# Patient Record
Sex: Female | Born: 1965
Health system: Southern US, Community
[De-identification: ages and names within clinical notes are randomized; demographics above are authoritative.]

## PROBLEM LIST (undated history)

## (undated) DIAGNOSIS — E669 Obesity, unspecified: Secondary | ICD-10-CM

## (undated) DIAGNOSIS — G40909 Epilepsy, unspecified, not intractable, without status epilepticus: Secondary | ICD-10-CM

## (undated) DIAGNOSIS — J45909 Unspecified asthma, uncomplicated: Secondary | ICD-10-CM

## (undated) DIAGNOSIS — T7840XA Allergy, unspecified, initial encounter: Secondary | ICD-10-CM

## (undated) DIAGNOSIS — F79 Unspecified intellectual disabilities: Secondary | ICD-10-CM

## (undated) DIAGNOSIS — F445 Conversion disorder with seizures or convulsions: Secondary | ICD-10-CM

## (undated) DIAGNOSIS — R569 Unspecified convulsions: Secondary | ICD-10-CM

## (undated) DIAGNOSIS — E785 Hyperlipidemia, unspecified: Secondary | ICD-10-CM

## (undated) DIAGNOSIS — I10 Essential (primary) hypertension: Secondary | ICD-10-CM

## (undated) HISTORY — PX: REDUCTION MAMMAPLASTY: SUR839

## (undated) HISTORY — DX: Unspecified asthma, uncomplicated: J45.909

## (undated) HISTORY — DX: Essential (primary) hypertension: I10

## (undated) HISTORY — DX: Obesity, unspecified: E66.9

## (undated) HISTORY — PX: BREAST CYST EXCISION: SHX579

## (undated) HISTORY — DX: Hyperlipidemia, unspecified: E78.5

## (undated) HISTORY — DX: Allergy, unspecified, initial encounter: T78.40XA

---

## 2004-07-23 ENCOUNTER — Other Ambulatory Visit: Admission: RE | Admit: 2004-07-23 | Discharge: 2004-07-23 | Payer: Self-pay | Admitting: Internal Medicine

## 2004-07-30 ENCOUNTER — Encounter: Admission: RE | Admit: 2004-07-30 | Discharge: 2004-07-30 | Payer: Self-pay | Admitting: Internal Medicine

## 2004-10-09 ENCOUNTER — Emergency Department (HOSPITAL_COMMUNITY): Admission: EM | Admit: 2004-10-09 | Discharge: 2004-10-09 | Payer: Self-pay | Admitting: Family Medicine

## 2005-03-02 ENCOUNTER — Inpatient Hospital Stay (HOSPITAL_COMMUNITY): Admission: EM | Admit: 2005-03-02 | Discharge: 2005-03-04 | Payer: Self-pay | Admitting: Emergency Medicine

## 2005-05-21 ENCOUNTER — Encounter (INDEPENDENT_AMBULATORY_CARE_PROVIDER_SITE_OTHER): Payer: Self-pay | Admitting: Pathology

## 2005-05-21 ENCOUNTER — Ambulatory Visit (HOSPITAL_BASED_OUTPATIENT_CLINIC_OR_DEPARTMENT_OTHER): Admission: RE | Admit: 2005-05-21 | Discharge: 2005-05-21 | Payer: Self-pay | Admitting: General Surgery

## 2005-05-30 ENCOUNTER — Emergency Department (HOSPITAL_COMMUNITY): Admission: EM | Admit: 2005-05-30 | Discharge: 2005-05-30 | Payer: Self-pay | Admitting: Emergency Medicine

## 2005-10-28 ENCOUNTER — Emergency Department (HOSPITAL_COMMUNITY): Admission: EM | Admit: 2005-10-28 | Discharge: 2005-10-28 | Payer: Self-pay | Admitting: Emergency Medicine

## 2005-11-15 ENCOUNTER — Ambulatory Visit (HOSPITAL_COMMUNITY): Payer: Self-pay | Admitting: Psychiatry

## 2006-01-24 ENCOUNTER — Emergency Department (HOSPITAL_COMMUNITY): Admission: EM | Admit: 2006-01-24 | Discharge: 2006-01-24 | Payer: Self-pay | Admitting: Emergency Medicine

## 2006-02-13 ENCOUNTER — Emergency Department (HOSPITAL_COMMUNITY): Admission: EM | Admit: 2006-02-13 | Discharge: 2006-02-13 | Payer: Self-pay | Admitting: Emergency Medicine

## 2006-02-19 ENCOUNTER — Emergency Department (HOSPITAL_COMMUNITY): Admission: EM | Admit: 2006-02-19 | Discharge: 2006-02-19 | Payer: Self-pay | Admitting: Emergency Medicine

## 2006-03-09 ENCOUNTER — Encounter: Admission: RE | Admit: 2006-03-09 | Discharge: 2006-06-07 | Payer: Self-pay | Admitting: Internal Medicine

## 2006-06-14 ENCOUNTER — Inpatient Hospital Stay (HOSPITAL_COMMUNITY): Admission: AD | Admit: 2006-06-14 | Discharge: 2006-06-16 | Payer: Self-pay | Admitting: Pediatrics

## 2007-02-04 ENCOUNTER — Inpatient Hospital Stay (HOSPITAL_COMMUNITY): Admission: EM | Admit: 2007-02-04 | Discharge: 2007-02-06 | Payer: Self-pay | Admitting: Emergency Medicine

## 2007-02-10 ENCOUNTER — Other Ambulatory Visit: Admission: RE | Admit: 2007-02-10 | Discharge: 2007-02-10 | Payer: Self-pay | Admitting: Internal Medicine

## 2007-02-28 ENCOUNTER — Emergency Department (HOSPITAL_COMMUNITY): Admission: EM | Admit: 2007-02-28 | Discharge: 2007-02-28 | Payer: Self-pay | Admitting: Emergency Medicine

## 2007-05-15 ENCOUNTER — Emergency Department (HOSPITAL_COMMUNITY): Admission: EM | Admit: 2007-05-15 | Discharge: 2007-05-15 | Payer: Self-pay | Admitting: Emergency Medicine

## 2007-08-06 ENCOUNTER — Emergency Department (HOSPITAL_COMMUNITY): Admission: EM | Admit: 2007-08-06 | Discharge: 2007-08-06 | Payer: Self-pay | Admitting: Emergency Medicine

## 2007-11-10 ENCOUNTER — Emergency Department (HOSPITAL_BASED_OUTPATIENT_CLINIC_OR_DEPARTMENT_OTHER): Admission: EM | Admit: 2007-11-10 | Discharge: 2007-11-10 | Payer: Self-pay | Admitting: Emergency Medicine

## 2007-11-15 ENCOUNTER — Emergency Department (HOSPITAL_COMMUNITY): Admission: EM | Admit: 2007-11-15 | Discharge: 2007-11-15 | Payer: Self-pay | Admitting: Emergency Medicine

## 2008-05-29 ENCOUNTER — Other Ambulatory Visit: Admission: RE | Admit: 2008-05-29 | Discharge: 2008-05-29 | Payer: Self-pay | Admitting: Cardiology

## 2008-09-03 ENCOUNTER — Encounter: Admission: RE | Admit: 2008-09-03 | Discharge: 2008-09-03 | Payer: Self-pay | Admitting: Internal Medicine

## 2008-09-26 ENCOUNTER — Encounter: Admission: RE | Admit: 2008-09-26 | Discharge: 2008-09-26 | Payer: Self-pay | Admitting: Internal Medicine

## 2008-11-12 ENCOUNTER — Ambulatory Visit (HOSPITAL_COMMUNITY): Admission: RE | Admit: 2008-11-12 | Discharge: 2008-11-12 | Payer: Self-pay | Admitting: General Surgery

## 2008-11-12 ENCOUNTER — Encounter (INDEPENDENT_AMBULATORY_CARE_PROVIDER_SITE_OTHER): Payer: Self-pay | Admitting: General Surgery

## 2009-07-13 ENCOUNTER — Emergency Department (HOSPITAL_COMMUNITY): Admission: EM | Admit: 2009-07-13 | Discharge: 2009-07-13 | Payer: Self-pay | Admitting: Emergency Medicine

## 2010-02-16 ENCOUNTER — Encounter: Payer: Self-pay | Admitting: Internal Medicine

## 2010-04-12 LAB — POCT I-STAT, CHEM 8
Hemoglobin: 13.9 g/dL (ref 12.0–15.0)
Sodium: 136 mEq/L (ref 135–145)
TCO2: 27 mmol/L (ref 0–100)

## 2010-04-12 LAB — GLUCOSE, CAPILLARY: Glucose-Capillary: 273 mg/dL — ABNORMAL HIGH (ref 70–99)

## 2010-04-12 LAB — CBC
HCT: 37.4 % (ref 36.0–46.0)
MCV: 84.5 fL (ref 78.0–100.0)
Platelets: 158 10*3/uL (ref 150–400)
RDW: 14.1 % (ref 11.5–15.5)

## 2010-04-12 LAB — DIFFERENTIAL
Basophils Absolute: 0 10*3/uL (ref 0.0–0.1)
Eosinophils Absolute: 0 10*3/uL (ref 0.0–0.7)
Eosinophils Relative: 1 % (ref 0–5)

## 2010-04-30 LAB — DIFFERENTIAL
Basophils Absolute: 0 10*3/uL (ref 0.0–0.1)
Lymphocytes Relative: 38 % (ref 12–46)
Monocytes Absolute: 0.3 10*3/uL (ref 0.1–1.0)
Neutro Abs: 2.2 10*3/uL (ref 1.7–7.7)
Neutrophils Relative %: 53 % (ref 43–77)

## 2010-04-30 LAB — CBC
HCT: 40.7 % (ref 36.0–46.0)
MCV: 85.6 fL (ref 78.0–100.0)
Platelets: 193 10*3/uL (ref 150–400)
RDW: 14.4 % (ref 11.5–15.5)

## 2010-04-30 LAB — COMPREHENSIVE METABOLIC PANEL
Albumin: 4.1 g/dL (ref 3.5–5.2)
BUN: 6 mg/dL (ref 6–23)
Chloride: 102 mEq/L (ref 96–112)
Creatinine, Ser: 0.51 mg/dL (ref 0.4–1.2)
Total Bilirubin: 0.6 mg/dL (ref 0.3–1.2)
Total Protein: 7.4 g/dL (ref 6.0–8.3)

## 2010-06-09 NOTE — Procedures (Signed)
ELECTROENCEPHALOGRAM NUMBER:  [id number].   ORDERING PHYSICIANS:  Dr. Sandria Manly and Dr. Nash Shearer.   HISTORY OF PRESENT ILLNESS:  A 45 year old woman with intractable  seizures admitted with an episode of possible seizure activity with  rigidity and convulsions.   CURRENT MEDICATIONS:  Medications listed include Lamictal, Keppra,  Actos, Lantus, metformin and Norvasc.   ELECTROENCEPHALOGRAM PROCEDURE:  This is a routine 17-channel EEG with 1  channel devoted to EKG, utilizing International 10/20 lead placement  system.  The patient was described as being awake and asleep clinically.  Electrographically the patient appeared to be awake and drowsy and in  light natural sleep.  While awake the background consisted of a fairly  well-organized, well-developed, well-modulated 10 Hz alpha activity that  was predominantly in the posterior head regions and reactive to eye-  opening.  Attenuation in the background drowsiness and sleep, some sharp  activity seen over the vertex with drowsiness and sleep spindles, K  complexes and delta activity were seen during sleep.  No definite  epileptiform discharges were seen.  No clear interhemispheric asymmetry  is identified.  Hyperventilation did not produce significant change in  background activity.  Photic stimulation did not produce any change in  background activity however, the patient was still in the drowsy and  sleep states during this and was not fully roused.   The EKG monitor reveals relatively regular rhythm with a rate of 72  beats per minute.   CONCLUSION:  Normal electroencephalogram in the waking, drowsy and light  natural sleep states without seizure activity seen during course of  today's recording.  Clinical correlation is recommended.      Catherine A. Orlin Hilding, M.D.  Electronically Signed     EAV:[id number]  D:  06/16/2006 10:09:38  T:  06/16/2006 10:40:00  Job #:  [id number]

## 2010-06-09 NOTE — H&P (Signed)
NAMEKEISHAWNA, CARRANZA              ACCOUNT NO.:  [id number]   MEDICAL RECORD NO.:  [id number]          PATIENT TYPE:  OBV   LOCATION:  4731                         FACILITY:  [facility]   PHYSICIAN:  Melvyn Novas, M.D.  DATE OF BIRTH:  [birth year]   DATE OF ADMISSION:  02/04/2007  DATE OF DISCHARGE:                              HISTORY & PHYSICAL   DATE OF ADMISSION:  February 04, 2007, at noontime.   Mrs. Mondesir presented today in the morning to the [facility] ER after  having suffered a seizure and a prolonged postictum at home. The patient  has a known seizure disorder and was accompanied by her mother who also  stated that her daughter was unusually  drowsy for such a long time and  remained disoriented after the seizure.  During her waking period in the ER, the patient suffered a second  seizure like episode , observed by nurses and physicians. She did remain  so drowsy after the second seizure, which was self limited, that it was  not possible to discharge her home, and she was admitted for an  observational period.   HISTORY OF PRESENT ILLNESS:  The patient has a known seizure disorder  and is followed at [facility] by Dr. Nash Shearer.  She had four ER visits for  seizures in 2007, two in 2008.  Her seizures have always been  generalized since childhood, and she had been on Depakote and Keppra  until 18 months ago when she developed leukopenia and was also gaining  rapidly weight.  She was changed from Depakote to Lamictal and has been  doing well with this current medication regimen which I will in detail  list later.   PAST MEDICAL HISTORY.:  Besides the generalized seizure disorder with  tonic-clonic type seizures, she has also obesity, probably medication  induced. She has type 2 diabetes mellitus secondary to obesity.  She has  hypertension secondary to obesity.  She is snoring and hypersomnic but  has never been checked for obstructive sleep apnea, she states, and she  has cystic  breast disease was negative  mammogram fairly recently.  She  also at one time had a breast biopsy which was negative.   CURRENT MEDICATIONS:  1. Keppra 2000 mg b.i.d.  2. Lamictal 150 mg in the morning, 200 in the evening.  3. Lantus insulin 14 units daily.  4. Actos 15 mg daily.  5. Metformin 500 mg daily.  6. Norvasc 5 mg daily.  7. Aspirin 81 mg daily.   A head CT today was unremarkable.   A toxicology screen was ordered but not yet available.  The pH was 7.4,  so is fairly high; pCO2 was a low.  Hemoglobin 15.1, hematocrit  44.  Sodium was 37, potassium 4.4 glucose 149.   PHYSICAL EXAMINATION:  VITAL SIGNS:  Temperature was 97 degrees  Fahrenheit which is equal to 36 degrees Celsius.  Pulse rate was 62.  Respirations were 20 and unlabored. Blood pressure was 128/70. Heart  rate was regular.  GENERAL:  There was no murmur.  No bruit.  There were no peripheral  injuries seen. No tongue bite.  as I interview the patient, she cannot  state if she had any urinary or stool incontinence and states that when  she awoke, she had a hospital gown on. She does not know what happened  exactly.  HEENT:  She is atraumatic normocephalic, anicteric.  NEUROLOGIC:  She provides full extraocular movements, but her speech is  slurred as the patient appears extremely drowsy. She follows simple  motor commands, able to extend all four extremities antigravity. Deep  tendon reflexes are 1+.  She has upgoing toes to plantar stimulation  bilaterally. She states that she feels touch, pinprick and vibration in  both feet and both hands.  There is no evidence of extinction.  She fell  asleep during the finger-nose test, but focal weakness was not noticed.  Gait had to be deferred due to the patient's mental status and  drowsiness. I did not notice any tremor or ataxia.   ASSESSMENT:  Breakthrough seizures in a patient with existing  generalized seizure disorder since childhood.  Question of additional  psychiatric or developmental  conditions, as  patient communicates not on a level expected for age .   PLAN:  Check Lamictal and check Keppra levels.  Give one dose of Keppra extra IV.  I have ordered 1 gram extra, and once  the patient is able to take p.o. medication, resume the home regimen.  The patient is here for an observational period, and hold I hope that I  can discharge her tomorrow.  Should her mental status remain impaired, we might have to obtain an EEG  on Monday here before discharging the patient.      Melvyn Novas, M.D.  Electronically Signed     CD/MEDQ  D:  02/04/2007  T:  02/05/2007  Job:  [id number]   cc:   Estanislado Pandy, MD

## 2010-06-09 NOTE — H&P (Signed)
Tammy Arellano, Tammy Arellano              ACCOUNT NO.:  [id number]   MEDICAL RECORD NO.:  [id number]          PATIENT TYPE:  EMS   LOCATION:  ED                           FACILITY:  [facility]   PHYSICIAN:  Deanna Artis. Hickling, M.D.DATE OF BIRTH:  08-01-1965   DATE OF ADMISSION:  06/14/2006  DATE OF DISCHARGE:                              HISTORY & PHYSICAL   CHIEF COMPLAINT:  Generalized seizures.   HISTORY OF PRESENT ILLNESS:  The patient is at present postictal.  There  is no family present.  History is obtained from the ER notes and prior  records.   The patient has had history of seizures for many years, possibly dating  back to childhood.  She was admitted to [facility] from  [place] through 8, 2007 with seizures.  She has had emergency room  visits May6, Altha Harm, and December31,2007.  In her initial  presentation, she was on Depakote and Keppra and had a white blood cell  count 3500.  Depakote was discontinued and changed to Lamictal at that  time because of her morbid obesity.   She presented to [facility] emergency room this morning around 3:55  a.m. after having had a generalized tonic-clonic seizure at home.  She  was postictal on arrival.  She was observed without further treatment  and had a second seizure at 5:45 a.m..  I was contacted by emergency  department physician, Dr. Preston Fleeting  at 5:55 and agreed to see the patient.  A third seizure occurred after my arrival around 6:45 a.m.  I did not  witness it.  All these were reportedly brief, less than two minutes in  duration, with some bloody sputum in the last two and postictal  depression lasting 1/2 hour to 1 hour.   I ordered 1000 mg of Keppra IV, one-time dose, and will admit the  patient to [facility]   PAST MEDICAL HISTORY:  Morbid obesity, seizures, type 2 insulin-  dependent diabetes mellitus, and hypertension.   PAST SURGICAL HISTORY:  Breast biopsy in February2007.   REVIEW OF SYSTEMS:   Unobtainable, but by the emergency department  physician a 12 system review was negative.   MEDICATIONS:  Keppra 2000 mg twice daily, ACTOplus Met  oral 15 mg/500  mg once daily, Norvasc oral 5 mg once daily, Lamictal oral 150 mg twice  daily, Levemir FlexPen  subcutaneous 4 units at bedtime.  Other possible  medications are metformin, vitamin E, D- alpha, aspirin, and calcium.  These doses are unknown.   DRUG ALLERGIES:  PENICILLIN, DILANTIN.   ENVIRONMENTAL ALLERGIES:  SHELLFISH AND LATEX.   THE PATIENT ALSO HAS AN UPSET STOMACH FROM ASPIRIN AND FROM TYLENOL.   SOCIAL HISTORY:  The no tobacco, alcohol, or drugs.  She lives with a  relative, perhaps her mother.  No one accompanied her to the hospital.   FAMILY HISTORY:  Unknown to me and cannot be obtained at this time.   PHYSICAL EXAMINATION:  VITAL SIGNS:  Blood pressure 121/62, resting  pulse 93, respirations 22, oxygen saturation 100%, temperature 97.4.  EAR, NOSE, AND THROAT:  She  has wax in her ears.  NECK:  Supple.  LUNGS:  Lungs are clear.  Heart: No murmurs.  Pulses normal.  ABDOMEN: Soft.  Bowel sounds normal.  EXTREMITIES:  Negative.  NEUROLOGIC:  The patient is stuporous.  She barely regards the examiner.  She blinks to scare.  She will fix and follow briefly with her eyes.  Pinpoint pupils.  She has positive red reflexes.  Her extraocular  movements were limited at this time.  She has symmetric facial strength.  Motor examination:  She moves all four extremities.  She has weak grips  bilaterally.  Sensation:  Withdrawal x4, legs and arms.  Deep tendon  reflexes are diminished.  The patient had bilateral flexor plantar  responses.   IMPRESSION:  Recurrent brief generalized tonic-clonic seizures. (345.11)  The etiology of the flurry of seizures is unknown.   PLAN:  1. CT brain, noncontrast  2. EEG.  3. Continue her current medications although we did give a loading      dose of Keppra 1000 mg at this time.    Will transfer to [facility] for ongoing care.  She will presumably be  cared for by Dr. Imagene Gurney who follows her in our office.      Deanna Artis. Sharene Skeans, M.D.  Electronically Signed     WHH/MEDQ  D:  06/14/2006  T:  06/14/2006  Job:  [id number]

## 2010-06-09 NOTE — Discharge Summary (Signed)
NAMEADRIJANA, HAROS              ACCOUNT NO.:  [id number]   MEDICAL RECORD NO.:  [id number]          PATIENT TYPE:  OBV   LOCATION:  4731                         FACILITY:  [facility]   PHYSICIAN:  Melvyn Novas, M.D.  DATE OF BIRTH:  July 07, 1965   DATE OF ADMISSION:  02/04/2007  DATE OF DISCHARGE:  02/05/2007                               DISCHARGE SUMMARY   HOSPITAL COURSE:  Ms. Biggins was admitted yesterday on February 04, 2007, for an observational period and is discharged today after a 24-  hour seizure free period.  Ms. Guilbault is a patient of Dr. Deneen Harts at [facility] who presented with a seizure  breakthrough activity after almost 8 months of seizure freedom.  The  patient had a normal Chem-7 with a sodium of 139, potassium 3.6, normal  glucose levels.  She was not hypoglycemic.  CBC and differential were  normal levels.  H&H was 11.6 and 34.7, so no severe anemia was found.  Urine tests showed no UTI.  A toxicity screen had been ordered in the  ER, but not performed.  Pregnancy test was negative.  Creatinine was 0.6  and a blood gas showed a pH of 7.408 with bicarb of 24.7.  The patient  recovered her mental status pretty well once she had been able to sleep  for 2-3 hours on the ward.  The patient is a 45 year old, right-handed,  African American female with a history of seizure disorder and  hypertension, snoring and hypersomnia, cystic breast disease, diabetes  mellitus and obesity.   DISPOSITION:  The patient will be discharged home.   DISCHARGE MEDICATIONS:  1. Keppra 2000 mg twice a day.  We will have to specify brand name      only.  2. Lamictal 150 mg in the morning; 200 in p.m.  3. Lantus insulin 14 hours units daily.  4. Actos 15 mg a day.  5. Metformin 500 mg daily.  6. Norvasc 5 mg daily.  7. Baby aspirin.   CONDITION ON DISCHARGE:  Head CT had been unremarkable and the patient  will be discharged home after this observational  period of less than 23  hours on the previous used medication regimen.   FOLLOW UP:  A followup visit with Dr. Nash Shearer will be arranged.  The  patient's family is asked to make an appointment tomorrow and call the  office for a followup within the next 14-21 days at [phone].   Addendum: Mrs . Chiao produced two syncopal episodes after walking  with the physical therapist on the day of her planned discharge. She  remained therefor in the hospital , a decion the college on sunday call  had made. On Monday morning upon the new discharge date, this was again  observed and associated with tonic and clonic movements.  The vital signs were stable on telemetry, no increase in heart rate ,  respiratory rate or decrease in oxygen saturation  was seen on telemetry while the patient was presumably in a  seizure.  A psychiatric consultation was requested and Dr. Jeanie Sewer evaluated the  patient  and diagnosed a somatiform disorder. The patient is to follow up  with her established psychiatrist in [place].      Melvyn Novas, M.D.  Electronically Signed     CD/MEDQ  D:  02/05/2007  T:  02/06/2007  Job:  [id number]   cc:   Bevelyn Buckles. Nash Shearer, M.D.

## 2010-06-09 NOTE — Procedures (Signed)
REFERRING PHYSICIAN:  Melvyn Novas, MD.   CLINICAL HISTORY:  This is a 45 year old lady with episodes of seizures,  being evaluated for pseudoseizures.  The patient had a pseudoseizure  during the setup of the EEG, which was reported by the technician to the  RN.  The patient was described as complaining of head hurting with room  spinning and dizziness during the episode of body shook and her flapping  at her side and she bit her tongue; this lasted for less than 5 minutes.  The patient was immediately conversing with the technician after the  episode.   This is a routine EEG recorded with the patient awake and drowsy using a  17-channel machine and standard 10/20 electrode placement.   The background awake rhythm consists of 11-Hz alpha, which is of  moderate amplitude, synchronous, reactive to eye-opening and closure.  No paroxysmal epileptiform activity, spikes or sharp waves are seen.  Changes of drowsiness are achieved and show normal physiological  findings.  The patient is apparently postictal during this EEG, but no  definite postictal findings like generalized slowing or any sharp waves  are noted.  Length of this recording is 22.1 minutes.  Technical  component is average.  EKG tracing reveals regular sinus rhythm.  Hyperventilation not performed.  Photic stimulation is unremarkable.   IMPRESSION:  This EEG performed during the awake and drowsy states is  within normal limits.  The patient apparently had a seizure episode  immediately during the setup of the EEG; however, EEG being completely  normal, would favor of pseudoseizure.  If clinically beneficial, a video  EEG would be better for the recording her pseudoseizures.           ______________________________  Sunny Schlein. Pearlean Brownie, MD     JXB:[id number]  D:  02/06/2007 18:29:51  T:  02/07/2007 09:53:37  Job #:  [id number]

## 2010-06-09 NOTE — Discharge Summary (Signed)
Tammy Arellano, Tammy Arellano              ACCOUNT NO.:  [id number]   MEDICAL RECORD NO.:  [id number]          PATIENT TYPE:  INP   LOCATION:  3008                         FACILITY:  [facility]   PHYSICIAN:  Bevelyn Buckles. Champey, M.D.DATE OF BIRTH:  1965/12/30   DATE OF ADMISSION:  06/14/2006  DATE OF DISCHARGE:  06/16/2006                               DISCHARGE SUMMARY   FINAL DIAGNOSIS:  Seizures.   SECONDARY DIAGNOSES:  1. Seizure disorder.  2. Diabetes.  3. Hypertension.  4. Obesity.   DISCHARGE MEDICATIONS:  Include:  1. Lamictal 150 mg in the morning, 200 mg at night.  2. Keppra 2000 mg twice a day.  3. Lantus 14 units q.h.s.  4. Actos 15 mg per day.  5. Metformin 500 mg per day.  6. Norvasc 5 mg per day.  7. Baby aspirin per day.   HOSPITAL COURSE:  Please see admission H&P from Dr. Ellison Carwin for  complete details.  Patient was admitted on Jun 14, 2006 with recurrent  seizures.  Also done, hospital CT scan of the head was unremarkable.  Patient had no further seizure activity and uncomplicated hospital  course.  Patient's Lamictal level was increased to 150 mg in the morning  and 200 mg at night, as patient's last  seizures occurred in late night  or early morning hours.  Patient did have an EEG performed which was  essentially within normal limits in the awake and sleep state.  She has  had a urinalysis performed which was essentially normal.  Patient had an  uncomplicated hospital stay and was discharged in stable condition on  Jun 16, 2006.  Patient is discharged and told to follow up with her  primary care physician the next month and with Dr. Nash Shearer, neurology,  within the next one to two months.      Bevelyn Buckles. Nash Shearer, M.D.  Electronically Signed     DRC/MEDQ  D:  06/16/2006  T:  06/16/2006  Job:  [id number]

## 2010-06-09 NOTE — Consult Note (Signed)
NAMEBEDIE, DOMINEY              ACCOUNT NO.:  [id number]   MEDICAL RECORD NO.:  [id number]          PATIENT TYPE:  INP   LOCATION:  4731                         FACILITY:  [facility]   PHYSICIAN:  Antonietta Breach, M.D.  DATE OF BIRTH:  1965/10/02   DATE OF CONSULTATION:  02/06/2007  DATE OF DISCHARGE:  02/06/2007                                 CONSULTATION   REQUESTING PHYSICIAN:  Melvyn Novas, MD   REASON FOR CONSULTATION:  Depression, rule out somatoform disorder.   HISTORY OF PRESENT ILLNESS:  Ms. Randle is a 45 year old female who was  admitted to the [facility] on February 04, 2007, after having a  convulsion with a prolonged postictal period.   The patient underwent observation on the general medical floor.  She had  two syncopal episodes after walking with the physical therapist.  The  patient developed tonic-clonic movements.  When discharge was proposed,  her vital signs during that time were completely stable on telemetry  without any increased pulse or change in oxygen saturation.   The undersigned was consulted.  The patient does not have depressed  mood.  She does have intact interests and constructive future goals.  She has no thoughts of harming herself or others.  She has no delusions  or hallucinations.   PAST PSYCHIATRIC HISTORY:  In review of the past medical record, the  patient does have a history of seizures going back to childhood.  She  did have a period of depression last year with suicidal ideation, this  resolved, and she is not having any depression now.   FAMILY PSYCHIATRIC HISTORY:  None known.   SOCIAL HISTORY:  The patient does not use alcohol or illegal drugs.   PAST MEDICAL HISTORY:  1. Seizure disorder.  2. Type 2 diabetes mellitus.  3. Hypertension.  4. Obesity.   MEDICATIONS:  Her MAR is reviewed.  She is on Lamictal 150 mg q.a.m. and  200 nightly.   ALLERGIES:  She is allergic to LATEX PENICILLIN, and DILANTIN.   LABORATORY DATA:  Sodium 140, BUN 3 and creatinine 0.47.  WBC 7,  hemoglobin 11.6, and platelet count 174.  Pregnancy negative.   REVIEW OF SYSTEMS:  Constitutional; head, eyes, ears, nose throat,  mouth; neurologic; psychiatric; cardiovascular; respiratory;  gastrointestinal; genitourinary; skin; musculoskeletal; hematologic;  lymphatic; endocrine; and metabolic, all unremarkable.   PHYSICAL EXAMINATION:  VITAL SIGNS:  Temperature 97.6, pulse 71,  respiratory rate 20, blood pressure 139/75, and O2 saturation on room  air 100%.  GENERAL APPEARANCE:  Ms. Kulinski is a middle-aged female, partially  reclined in a supine position in her hospital bed with no abnormal  involuntary movements.  MENTAL STATUS EXAM:  Ms. Lohmann is alert.  She is oriented to all  spheres.  Her attention span is normal.  Her concentration is normal.  Her affect is mildly flat.  Her mood is slightly anxious.  Her fund of  knowledge and intelligence are within normal limits.  Her memory is  intact to immediate recent and remote.  Speech involves normal rate and  prosody without dysarthria.  Her thought  process is logical, coherent,  and goal directed.  No looseness of associations.  Thought content; no  thoughts of harming herself, no thoughts of harming others, no  delusions, and no hallucinations.  Insight is partial.  Judgment is  intact.   ASSESSMENT:  AXIS I:  1. 293.83, mood disorder, not otherwise specified.  This was grief      versus depression, it is now in remission.  2. Rule out somatoform disorder, not otherwise specified.  AXIS II:  Deferred.  AXIS III:  See general medical problems in the past medical history.  AXIS IV:  General medical, primary support group.  AXIS V:  55.   Ms. Boyum is not at risk to harm herself or others.  She agrees to  call Emergency Services immediately for any thoughts of harming herself  or other psychiatric emergency symptoms.   The patient does not present with  obvious excessive repression that  could lead to somatoform symptoms; however, she could benefit from  psychotherapy.   The conditions of psychotherapy could allow relief of the somatoform  dynamics if they are present.  Also, her psychotherapist could help her  with general ego support and cognitive behavioral therapy.  It would be  best if this psychotherapist were trained and experienced in treating  somatoform disorders.      Antonietta Breach, M.D.  Electronically Signed     JW/MEDQ  D:  06/09/2007  T:  06/10/2007  Job:  [id number]

## 2010-06-12 NOTE — Op Note (Signed)
[name], Tammy Arellano              ACCOUNT NO.:  [id number]   MEDICAL RECORD NO.:  [id number]          PATIENT TYPE:  AMB   LOCATION:  DSC                          FACILITY:  [facility]   PHYSICIAN:  Adolph Pollack, M.D.DATE OF BIRTH:  [birth year]   DATE OF PROCEDURE:  05/21/2005  DATE OF DISCHARGE:                                 OPERATIVE REPORT   PREOPERATIVE DIAGNOSIS:  Bleeding, left nipple rash as well as left breast  rash.   POSTOPERATIVE DIAGNOSIS:  Bleeding, left nipple rash as well as left breast  rash.   PROCEDURE:  1.  Biopsy of left nipple x3 (the central, 9 o'clock, 7 o'clock).  2.  Biopsy of left breast medial skin.   SURGEON:  Adolph Pollack, M.D.   ANESTHESIA:  General.   INDICATIONS:  This is a 45 year old female who has been having intermittent  problems with bleeding from the nipple and areolar complex.  Mammogram does  not demonstrate any lesions and she has no palpable lesions, but has  persistent bleeding and an erosive type rash on the nipple.  She has also  now developed a rash medially.  She has tried topical agents without  success.  I have recommended biopsy.  However, she does not want to do this  office and is insisting on general anesthesia and, thus, presents for that.   TECHNIQUE:  She is seen the holding area and the left breast marked with my  initials.  She is then brought to the operating and placed supine on the  operating room and underwent a general anesthetic.  The left breast is  sterilely prepped and draped.  I used a size 3 punch biopsy and performed a  full thickness central nipple biopsy through the entire skin.  I then  repeated this at the 9 o'clock position and 7 o'clock position, labeling the  specimens separately.  I then biopsied a rash on the medial breast skin with  the punch, as well.  I used electrocautery to control bleeding and then  closed the wounds with 4-0 Monocryl subcuticular stitches.  The nipple wound  was  closed with simple 4-0 Monocryl stitches.  Dermabond and Steri-Strips  were applied.  A bulky dressing was then applied.  She tolerated the  procedure without apparent complications and was taken to the recovery room  in satisfactory condition.  She will be given discharge instructions,  Vicodin for pain, and follow-up with me in the office in two weeks.      Adolph Pollack, M.D.  Electronically Signed     TJR/MEDQ  D:  05/21/2005  T:  05/22/2005  Job:  [id number]   cc:   Juline Patch, M.D.  Fax: [phone]

## 2010-06-12 NOTE — Consult Note (Signed)
NAMEKEYMONI, MCCASTER              ACCOUNT NO.:  [id number]   MEDICAL RECORD NO.:  [id number]          PATIENT TYPE:  INP   LOCATION:  6532                         FACILITY:  [facility]   PHYSICIAN:  Melvyn Novas, M.D.  DATE OF BIRTH:  Jan 11, 1966   DATE OF CONSULTATION:  03/03/2005  DATE OF DISCHARGE:                                   CONSULTATION   The patient is admitted to Dr. Lonia Blood on the [facility] A Team.   The neurology consult was called by Dr. Lavera Guise.   The patient has, according to Dr. Doristine Devoid report, a history of a seizure  disorder, diabetes mellitus, hypertension and is morbidly obese.  She was scheduled in an outpatient procedure for a breast biopsy with Dr.  Abbey Chatters yesterday morning and was n.p.o. for the procedure and her  surgeon noticed a seizure-like event occurring prior to the biopsy in the  PACU.  The patient is described as having had convulsive extension and a  violent shaking of the upper extremities, eyes turned up to the ceiling,  hands bilaterally fisted.  One of Dr. Ellin Saba concerns was that the patient has a low white blood cell  count at 3.500 and he wondered if it would be beneficial for the patient to  change her antiepileptic drugs.  As to her history of present illness, he  stated that the patient lived in [place], [place] , has not for long-time  been a resident in [place], [place], resumed her primary care with  Dr. Ricki Miller ( to whom she has signed out medical records from [place]) and so  far not been established with a local neurologist. She states that she  carried the diagnosis of seizure disorder for many years, but appears very  aloof and unconcerned, child like.   PAST MEDICAL HISTORY:  1.  Morbidly obese.  2.  Seizure disorder.  3.  Diabetes mellitus.  4.  Hypertension.   REVIEW OF SYSTEMS:  The patient has no endorsement of review of systems  except that she felt yesterday after the seizures very fatigued.  She is not  sure if she was confused or postictal at all.  The notes in the chart  indicate that she was surprisingly not postictal.   CURRENT MEDICATIONS:  1.  Actos.  2.  Norvasc.  3.  Metformin.  4.  Keppra 500 mg t.i.d.  5.  Depakote 500 mg t.i.d.   The patient states she is allergic to SEAFOOD and IODINE.   VITAL SIGNS:  The patient is afebrile.  Her vital signs are stable.  LUNGS:  Clear to auscultation.  HEENT:  Normocephalic atraumatic skull.  CARDIOVASCULAR:  Cor regular rate and rhythm.  ABDOMEN:  Nontender.  No hepatosplenomegaly.  EXTREMITIES:  No peripheral clubbing, cyanosis, edema, rash or bruising.  NEUROLOGIC:  The patient is alert and oriented x3.   Cranial nerve examination shows full extraocular movements. No nystagmus. No  facial tremor or tongue trauma. No facial weakness or numbness, tongue and  uvula were midline.   Motor examination:  Equal strength, tone and mass of muscles bilaterally.  Deep tendon  reflexes are symmetric in the upper extremity, slightly  attenuated in her lower extremities with downgoing toes.  Gait is intact.  Coordination is intact by finger-nose test and heel-shin.   ASSESSMENT:  The patient had two brief breakthrough seizures yesterday in  a.m. likely because she held off on her p.o. medications.   PLAN:  A white blood cell count of 3.5 thousand is actually maintainable and  just needs regular control.  This alone would not call for a change from  Depakote or Keppra.  Her body weight, however, she describes as having  exploded on Depakote and I would therefore stress the benefits of Lamictal  for a primary generalized seizure disorder.  The patient is not sure which  kind of seizure disorder she has, by the way.  I think that the reducing her  body mass index will help also her diabetes mellitus and hypertension.  I would like for the current time to continue Keppra and Depakote at the  same dose and then begin Lamictal at 25 mg tomorrow or  today every other day  for 14 days.  After 14 days we increase the dose to 25 mg daily and after  another 14 days to 25 mg twice a day and during that period of week four to  six, the patient should be seen in an outpatient setting at my office.  Phone number [phone].  She was also advised to call me if she has a  rash on mucous lining, genital, rectal or oral. I would support a Klonopin  prescription until the patient is able to establish a normal sleep rhythm at  0.5 mg nightly p.o. I think the patient is safe for discharge.      Melvyn Novas, M.D.  Electronically Signed     CD/MEDQ  D:  03/03/2005  T:  03/03/2005  Job:  [id number]   cc:   Lonia Blood, M.D.   Juline Patch, M.D.  Fax: [phone]

## 2010-06-12 NOTE — H&P (Signed)
NAMELAPORSCHE, HOEGER              ACCOUNT NO.:  [id number]   MEDICAL RECORD NO.:  [id number]          PATIENT TYPE:  INP   LOCATION:  1845                         FACILITY:  [facility]   PHYSICIAN:  Lonia Blood, M.D.       DATE OF BIRTH:  December 10, 1965   DATE OF ADMISSION:  03/02/2005  DATE OF DISCHARGE:                                HISTORY & PHYSICAL   PRIMARY CARE PHYSICIAN:  Juline Patch, M.D.   PRIMARY NEUROLOGIST:  Pramod P. Pearlean Brownie, M.D.   CHIEF COMPLAINT:  Seizure.   HISTORY OF PRESENT ILLNESS:  Mrs. Fojtik is a 45 year old woman with a  history of epilepsy who was brought to [facility] Emergency Room after she  had an episode of seizure while she was at the surgical center for a breast  biopsy.  In the emergency room, she had 2 more episodes of seizures, so we  were called to admit the patient to the hospital.  Mrs. Okray reports that  she has a long history of seizures, but she does not remember exactly the  name of the diagnosis or when exactly this was diagnosed.  She moved her  recently from [place] about a year ago and, as far as she remembers, this  year she has been taking Keppra and Depakote of unknown dosage at the same  amount without any changes.   PAST MEDICAL HISTORY:  1.  Diabetes.  2.  Hypertension.  3.  Seizures.  4.  Obesity.   SOCIAL HISTORY:  The patient moved to [place] from [place] to live with  her sister.  She is on disability secondary to seizure disorder.  She is  single.  Does not smoke cigarettes, does not use alcohol, does not use any  intravenous drugs.   MEDICATIONS:  Include Depakote, Keppra, Actos, Norvasc, and metformin, but I  do not have the doses.   REVIEW OF SYSTEMS:  Negative for all systems except for left breast pain.   PHYSICAL EXAMINATION:  VITAL SIGNS: On admission, temperature 98.1, pulse  97, respirations 19, blood pressure 131/101, saturation 99% on room air.  GENERAL: The patient is lethargic, easily arousable.  She  is oriented to  place, person, and time.  HEENT:  Head is normocephalic and atraumatic.  Eyes show pupils equal,  round, and reactive to light and accommodation.  Extraocular movements  intact.  Mouth is without ulcerations. Throat is clear.  NECK:  Supple without JVD.  No carotid bruits.  CHEST: Clear to auscultation bilaterally without wheeze, rhonchi, or  crackles.  HEART: Regular rate and rhythm without murmurs, rubs, or gallops.  ABDOMEN: Soft, nontender, nondistended.  Bowel sounds are present. There is  no palpable hepatosplenomegaly.  EXTREMITIES:  No edema. Pulses present.  SKIN: There is no suspicious rash on the skin.  NEUROLOGIC:  Cranial nerves II-XII intact.  Nonfocal.  Strength 5/5 in all  extremities.  Cerebellum intact. Sensation is intact, tactile sensation and  cold and warm sensation. Deep tendon reflexes are symmetric.  Plantars are  downgoing.  PSYCHIATRIC: The patient has lapses in her memory.  She has a  flat affect.   LABORATORY DATA:  On admission, white blood count 3000, hemoglobin 13,  platelet count 150.  Creatinine 0.7, glucose 113, BUN 10, sodium 138,  potassium 4.4, chloride 107.  Urinalysis within normal limits. Valproic acid  147.   ASSESSMENT AND PLAN:  1.  Recurrent seizures in known epileptic. I am concerned about the reason      for patient's seizures since she is already on 2 antiepileptic      medications, and she seems to be compliant given the measured level of      valproic acid.  My plan is to hold the valproic acid today, resume it      tomorrow.  Continue the Keppra at the lowest dose.  Also, after      discussion with Dr. Vickey Huger, we initiated Klonopin for tonight and      initiated Lamictal.  The patient will need to have close monitoring in      the hospital on telemetry for possibility of recurrence of seizures. She      will be placed on seizure precautions. We will also obtain an      electroencephalogram (EEG), and we will obtain  a head CT to rule out any      recent trauma.  2.  Leukopenia, most likely secondary to medications.  Both valproic acid      and Keppra can cause leukopenia.  At this point, I will try to taper the      patient off the Depakote, initiate Lamictal, monitor closely the white      blood cell count.  3.  Thrombocytopenia.  This could be caused by the valproic acid.  Once      again, we will try to taper the patient off the valproic acid and follow      the counts.  4.  Diabetes mellitus.  Will check hemoglobin A1c and keep her on sliding      scale insulin while she is here in the hospital.      Lonia Blood, M.D.  Electronically Signed     SL/MEDQ  D:  03/02/2005  T:  03/02/2005  Job:  [id number]   cc:   Juline Patch, M.D.  Fax: [phone]   [facility]

## 2010-06-12 NOTE — Discharge Summary (Signed)
NAME:  Tammy Arellano, Tammy Arellano              ACCOUNT NO.:  [id number]   MEDICAL RECORD NO.:  [id number]          PATIENT TYPE:  INP   LOCATION:  6532                         FACILITY:  [facility]   PHYSICIAN:  Mobolaji B. Bakare, M.D.DATE OF BIRTH:  [birth year]   DATE OF ADMISSION:  03/02/2005  DATE OF DISCHARGE:  03/04/2005                                 DISCHARGE SUMMARY   PRIMARY CARE PHYSICIAN:  Juline Patch, M.D.   PRIMARY NEUROLOGIST:  Bevelyn Buckles. Nash Shearer, M.D.   CONSULTING PHYSICIAN:  Melvyn Novas, M.D.   PROCEDURES:  1.  EEG done on February 7, showed abnormal EEG showing abnormal response to      photic stimulation suggestive of a primary generalized epilepsy and      indicative of a lowered threshold for seizures.  2.  CT scan of the head: Minimal diffuse cerebral and cerebellar atrophy, no      acute abnormality.   FINAL DIAGNOSIS:  Seizures.   SECONDARY DIAGNOSES:  1.  Type 2 diabetes mellitus.  2.  Hypertension.  3.  Obesity.   BRIEF HISTORY:  Ms. Pack is a 45 year old African American female who has  a history of epilepsy since childhood. She was scheduled for a breast biopsy  on the day of admission. While waiting for the procedure the patient had an  episode of seizures. She had further two more episodes while in the  emergency room. She claimed that she indeed used medications on the day of  admission and she has been compliant with medications. She has not had  breakthrough seizures for several years. The patient has been on Depakote  and Keppra. A neurology consult was obtained, Dr. Vickey Huger was on call, and  she recommended initiating Lamictal. Of note is that a valproic acid level  was 147.4. Head CT scan showed minimal atrophy without any acute  abnormalities.   HOSPITAL COURSE:  [name] was admitted onto telemetry and she was placed  on seizure precautions. She did not have any more seizures during the stay  in the hospital. She had an EEG which was  abnormal, showing abnormal  response to photic stimulation suggestive of primary generalized epilepsy  and indicative of a lowered threshold for seizures.   The patient was observed for approximately 48 hours and she did not have any  more seizures, and was deemed fit to be discharged home.   DISCHARGE MEDICATIONS:  1.  Lamictal 25 mg 1 tablet every other day for 2 weeks, then 1 every day      for 2 weeks, then 1 two times a day for 2 weeks, then 2 two times a day      for 2 weeks.  2.  Klonopin 1 mg at bedtime for 2 weeks.  3.  Depakote 500 mg t.i.d.  4.  Keppra 500 mg t.i.d.  5.  Norvasc 15 mg daily.  6.  Actos plus metformin 15/1000 daily.   FOLLOWUP:  With Dr. Nash Shearer on April 3, at 9:30 a.m., Dr. Ricki Miller in 1-2 weeks.   SPECIAL INSTRUCTIONS:  The patient was instructed to call Dr. Fredda Hammed  office if she develops a rash on Lamictal.   LABORATORY DATA:  Valproic acid level 147.4, normal range 50 to 100. White  cell count 4.8, hemoglobin 13.4, hematocrit 39.7, platelets 134. Keppra 22,  normal range 5 to 30. Creatinine 0.7, BUN 10.      Mobolaji B. Corky Downs, M.D.  Electronically Signed     MBB/MEDQ  D:  03/13/2005  T:  03/13/2005  Job:  [id number]   cc:   Juline Patch, M.D.  Fax: [phone]   Bevelyn Buckles. Nash Shearer, M.D.  Fax: [phone]

## 2010-06-12 NOTE — Procedures (Signed)
EEG NUMBER:  __________.   CLINICAL INFORMATION:  [age]-year-old female with a history of  seizures, on Keppra and Depakote.   DIAGNOSTICS:  This EEG was recorded during the awake state.  The background  activity shows 14-Hz rhythms with higher amplitudes in the posterior head  regions bilaterally.  There was no evidence of stage II sleep, but there was  some drowsiness present.  There was an abnormal photic response during this  EEG with high-amplitude sharp and slow wave activity noticed synchronously  in the temporal and occipital regions.  This high-amplitude activity and  sharp wave activity could indicate a lower threshold for seizures.  There  were no abnormality seen outside of photic stimulation.  Stage II sleep was  not present.   IMPRESSION:  This is an abnormal EEG showing abnormal response to photic  stimulation suggestive of a primary generalized epilepsy and indicative of a  lowered threshold for seizures.           ______________________________  Genene Churn. Sandria Manly, M.D.     WUX:LKGM  D:  03/03/2005 17:42:48  T:  03/04/2005 05:47:38  Job #:  [id number]   cc:   Melvyn Novas, M.D.  Fax: [phone]

## 2010-06-24 ENCOUNTER — Other Ambulatory Visit: Payer: Self-pay | Admitting: Registered Nurse

## 2010-06-24 ENCOUNTER — Other Ambulatory Visit (HOSPITAL_COMMUNITY)
Admission: RE | Admit: 2010-06-24 | Discharge: 2010-06-24 | Disposition: A | Discharge status: Home / Self Care | Payer: Medicare Other | Source: Ambulatory Visit | Attending: Internal Medicine | Admitting: Internal Medicine

## 2010-06-24 DIAGNOSIS — Z124 Encounter for screening for malignant neoplasm of cervix: Secondary | ICD-10-CM | POA: Insufficient documentation

## 2010-06-29 ENCOUNTER — Other Ambulatory Visit: Payer: Self-pay | Admitting: Internal Medicine

## 2010-06-29 DIAGNOSIS — N939 Abnormal uterine and vaginal bleeding, unspecified: Secondary | ICD-10-CM

## 2010-06-29 DIAGNOSIS — N6452 Nipple discharge: Secondary | ICD-10-CM

## 2010-07-02 ENCOUNTER — Ambulatory Visit
Admission: RE | Admit: 2010-07-02 | Discharge: 2010-07-02 | Disposition: A | Discharge status: Home / Self Care | Payer: Medicaid Other | Source: Ambulatory Visit | Attending: Internal Medicine | Admitting: Internal Medicine

## 2010-07-02 DIAGNOSIS — N939 Abnormal uterine and vaginal bleeding, unspecified: Secondary | ICD-10-CM

## 2010-07-08 ENCOUNTER — Ambulatory Visit
Admission: RE | Admit: 2010-07-08 | Discharge: 2010-07-08 | Disposition: A | Discharge status: Home / Self Care | Payer: Medicaid Other | Source: Ambulatory Visit | Attending: Internal Medicine | Admitting: Internal Medicine

## 2010-07-08 ENCOUNTER — Other Ambulatory Visit: Payer: Self-pay | Admitting: Internal Medicine

## 2010-07-08 DIAGNOSIS — N6452 Nipple discharge: Secondary | ICD-10-CM

## 2010-07-22 ENCOUNTER — Ambulatory Visit
Admission: RE | Admit: 2010-07-22 | Discharge: 2010-07-22 | Disposition: A | Discharge status: Home / Self Care | Payer: Medicare Other | Source: Ambulatory Visit | Attending: Internal Medicine | Admitting: Internal Medicine

## 2010-07-22 DIAGNOSIS — N6452 Nipple discharge: Secondary | ICD-10-CM

## 2010-10-15 LAB — BASIC METABOLIC PANEL
BUN: 3 — ABNORMAL LOW
CO2: 26
Chloride: 103
GFR calc Af Amer: >60
GFR calc non Af Amer: >60
Glucose, Bld: 115 — ABNORMAL HIGH
Glucose, Bld: 137 — ABNORMAL HIGH
Potassium: 3.2 — ABNORMAL LOW
Potassium: 3.6
Sodium: 139
Sodium: 140

## 2010-10-15 LAB — CBC
Hemoglobin: 11.6 — ABNORMAL LOW
RBC: 4.1
WBC: 7

## 2010-10-15 LAB — URINE MICROSCOPIC-ADD ON

## 2010-10-15 LAB — I-STAT 8, (EC8 V) (CONVERTED LAB)
BUN: 10
Chloride: 107
Hemoglobin: 15
Operator id: 294521
Potassium: 4.4
pCO2, Ven: 39.2 — ABNORMAL LOW

## 2010-10-15 LAB — LEVETIRACETAM LEVEL: Levetiracetam Lvl: 5.6 ug/mL

## 2010-10-15 LAB — POCT PREGNANCY, URINE
Operator id: 29452
Preg Test, Ur: NEGATIVE

## 2010-10-15 LAB — URINALYSIS, ROUTINE W REFLEX MICROSCOPIC
Ketones, ur: NEGATIVE
Leukocytes, UA: NEGATIVE
Nitrite: NEGATIVE
Protein, ur: 100 — AB
Urobilinogen, UA: 0.2
pH: 6.5

## 2010-10-15 LAB — POCT I-STAT CREATININE
Creatinine, Ser: 0.6
Operator id: 294521

## 2010-10-16 LAB — DIFFERENTIAL
Eosinophils Relative: 1
Lymphocytes Relative: 34
Lymphs Abs: 1.1
Monocytes Absolute: 0.2
Monocytes Relative: 7
Neutro Abs: 1.8

## 2010-10-16 LAB — CBC
HCT: 39.4
Hemoglobin: 13.1
RBC: 4.68

## 2010-10-16 LAB — BASIC METABOLIC PANEL
Calcium: 9.5
GFR calc Af Amer: >60
GFR calc non Af Amer: >60
Glucose, Bld: 131 — ABNORMAL HIGH
Potassium: 3.6
Sodium: 138

## 2010-10-16 LAB — URINALYSIS, ROUTINE W REFLEX MICROSCOPIC
Bilirubin Urine: NEGATIVE
Glucose, UA: NEGATIVE
Ketones, ur: NEGATIVE
Nitrite: NEGATIVE
Specific Gravity, Urine: 1.002 — ABNORMAL LOW
pH: 7.5

## 2010-10-20 LAB — CBC
HCT: 38
Hemoglobin: 12.6
RBC: 4.51
RDW: 15.1
WBC: 5

## 2010-10-20 LAB — DIFFERENTIAL
Basophils Absolute: 0
Eosinophils Relative: 0
Lymphocytes Relative: 25
Lymphs Abs: 1.2
Monocytes Absolute: 0.3
Monocytes Relative: 6
Neutro Abs: 3.4

## 2010-10-20 LAB — POCT I-STAT, CHEM 8
BUN: 10
Calcium, Ion: 1.12
Chloride: 104
Creatinine, Ser: 0.8

## 2010-10-20 LAB — POCT CARDIAC MARKERS
CKMB, poc: 1.4
Myoglobin, poc: 26.5
Myoglobin, poc: 28.2
Operator id: 151321
Operator id: 151321
Troponin i, poc: 0.07 — ABNORMAL HIGH
Troponin i, poc: <0.05
Troponin i, poc: <0.05

## 2010-10-22 LAB — DIFFERENTIAL
Basophils Absolute: 0
Basophils Relative: 1
Eosinophils Relative: 1
Monocytes Absolute: 0.2
Neutro Abs: 2.4

## 2010-10-22 LAB — POCT CARDIAC MARKERS
CKMB, poc: <1 — ABNORMAL LOW
Operator id: 284251
Troponin i, poc: <0.05

## 2010-10-22 LAB — URINALYSIS, ROUTINE W REFLEX MICROSCOPIC
Bilirubin Urine: NEGATIVE
Hgb urine dipstick: NEGATIVE
Ketones, ur: NEGATIVE
Protein, ur: NEGATIVE
Urobilinogen, UA: 0.2

## 2010-10-22 LAB — POCT I-STAT, CHEM 8
Calcium, Ion: 1.06 — ABNORMAL LOW
Chloride: 104
HCT: 42
Potassium: 4.3

## 2010-10-22 LAB — CBC
HCT: 39
Hemoglobin: 13.1
MCHC: 33.6
RDW: 16.2 — ABNORMAL HIGH

## 2010-10-22 LAB — RAPID URINE DRUG SCREEN, HOSP PERFORMED
Amphetamines: NOT DETECTED
Tetrahydrocannabinol: NOT DETECTED

## 2010-10-26 LAB — DIFFERENTIAL
Basophils Absolute: 0
Basophils Relative: 1
Lymphocytes Relative: 29
Neutro Abs: 2.5
Neutrophils Relative %: 64

## 2010-10-26 LAB — COMPREHENSIVE METABOLIC PANEL
Albumin: 3.2 — ABNORMAL LOW
BUN: 4 — ABNORMAL LOW
CO2: 27
Chloride: 108
Creatinine, Ser: 0.53
GFR calc non Af Amer: >60
Glucose, Bld: 89
Total Bilirubin: 0.5

## 2010-10-26 LAB — CBC
HCT: 38.3
Hemoglobin: 12.7
MCV: 85.2
Platelets: 171
WBC: 3.9 — ABNORMAL LOW

## 2010-10-26 LAB — URINALYSIS, ROUTINE W REFLEX MICROSCOPIC
Bilirubin Urine: NEGATIVE
Ketones, ur: NEGATIVE
Nitrite: NEGATIVE
pH: 8

## 2010-10-26 LAB — LIPASE, BLOOD: Lipase: 21

## 2010-10-26 LAB — VALPROIC ACID LEVEL: Valproic Acid Lvl: <10 — ABNORMAL LOW

## 2010-10-27 LAB — DIFFERENTIAL
Basophils Relative: 2 — ABNORMAL HIGH
Lymphs Abs: 1.1
Monocytes Relative: 7
Neutro Abs: 3.2
Neutrophils Relative %: 66

## 2010-10-27 LAB — BASIC METABOLIC PANEL
CO2: 29
Calcium: 8.8
GFR calc Af Amer: >60
GFR calc non Af Amer: >60
Glucose, Bld: 108 — ABNORMAL HIGH
Potassium: 4.1
Sodium: 143

## 2010-10-27 LAB — CBC
Platelets: 221
RBC: 4.74
WBC: 4.7

## 2010-10-31 ENCOUNTER — Emergency Department (HOSPITAL_COMMUNITY)
Admission: EM | Admit: 2010-10-31 | Discharge: 2010-11-01 | Disposition: A | Discharge status: Home / Self Care | Payer: Medicare Other | Attending: Emergency Medicine | Admitting: Emergency Medicine

## 2010-10-31 ENCOUNTER — Emergency Department (HOSPITAL_COMMUNITY): Payer: Medicare Other

## 2010-10-31 DIAGNOSIS — E119 Type 2 diabetes mellitus without complications: Secondary | ICD-10-CM | POA: Insufficient documentation

## 2010-10-31 DIAGNOSIS — R209 Unspecified disturbances of skin sensation: Secondary | ICD-10-CM | POA: Insufficient documentation

## 2010-10-31 DIAGNOSIS — G40909 Epilepsy, unspecified, not intractable, without status epilepticus: Secondary | ICD-10-CM | POA: Insufficient documentation

## 2010-10-31 DIAGNOSIS — R413 Other amnesia: Secondary | ICD-10-CM | POA: Insufficient documentation

## 2010-10-31 DIAGNOSIS — M25519 Pain in unspecified shoulder: Secondary | ICD-10-CM | POA: Insufficient documentation

## 2010-10-31 DIAGNOSIS — F79 Unspecified intellectual disabilities: Secondary | ICD-10-CM | POA: Insufficient documentation

## 2010-10-31 DIAGNOSIS — Z79899 Other long term (current) drug therapy: Secondary | ICD-10-CM | POA: Insufficient documentation

## 2010-10-31 DIAGNOSIS — W19XXXA Unspecified fall, initial encounter: Secondary | ICD-10-CM | POA: Insufficient documentation

## 2010-10-31 LAB — POCT I-STAT, CHEM 8
BUN: 10 mg/dL (ref 6–23)
Calcium, Ion: 1.1 mmol/L — ABNORMAL LOW (ref 1.12–1.32)
Chloride: 100 mEq/L (ref 96–112)
HCT: 40 % (ref 36.0–46.0)
Potassium: 4 mEq/L (ref 3.5–5.1)

## 2010-10-31 LAB — POCT I-STAT TROPONIN I: Troponin i, poc: 0 ng/mL (ref 0.00–0.08)

## 2010-11-01 ENCOUNTER — Emergency Department (HOSPITAL_COMMUNITY): Payer: Medicare Other

## 2010-11-01 LAB — DIFFERENTIAL
Basophils Relative: 2 % — ABNORMAL HIGH (ref 0–1)
Eosinophils Relative: 4 % (ref 0–5)
Lymphs Abs: 1.6 10*3/uL (ref 0.7–4.0)
Monocytes Absolute: 0.5 10*3/uL (ref 0.1–1.0)
Neutro Abs: 2.4 10*3/uL (ref 1.7–7.7)
Neutrophils Relative %: 50 % (ref 43–77)

## 2010-11-01 LAB — CBC
Hemoglobin: 12.5 g/dL (ref 12.0–15.0)
Platelets: 226 10*3/uL (ref 150–400)
RBC: 4.44 MIL/uL (ref 3.87–5.11)
WBC: 4.7 10*3/uL (ref 4.0–10.5)

## 2010-11-01 LAB — POCT PREGNANCY, URINE: Preg Test, Ur: NEGATIVE

## 2011-03-03 DIAGNOSIS — J45909 Unspecified asthma, uncomplicated: Secondary | ICD-10-CM | POA: Diagnosis not present

## 2011-03-03 DIAGNOSIS — G40309 Generalized idiopathic epilepsy and epileptic syndromes, not intractable, without status epilepticus: Secondary | ICD-10-CM | POA: Diagnosis not present

## 2011-03-03 DIAGNOSIS — R5381 Other malaise: Secondary | ICD-10-CM | POA: Diagnosis not present

## 2011-03-03 DIAGNOSIS — Z0000 Encounter for general adult medical examination without abnormal findings: Secondary | ICD-10-CM | POA: Diagnosis not present

## 2011-03-03 DIAGNOSIS — IMO0001 Reserved for inherently not codable concepts without codable children: Secondary | ICD-10-CM | POA: Diagnosis not present

## 2011-04-01 DIAGNOSIS — E109 Type 1 diabetes mellitus without complications: Secondary | ICD-10-CM | POA: Diagnosis not present

## 2011-05-13 DIAGNOSIS — G40309 Generalized idiopathic epilepsy and epileptic syndromes, not intractable, without status epilepticus: Secondary | ICD-10-CM | POA: Diagnosis not present

## 2011-05-26 DIAGNOSIS — J45901 Unspecified asthma with (acute) exacerbation: Secondary | ICD-10-CM | POA: Diagnosis not present

## 2011-05-26 DIAGNOSIS — J9801 Acute bronchospasm: Secondary | ICD-10-CM | POA: Diagnosis not present

## 2011-05-26 DIAGNOSIS — J309 Allergic rhinitis, unspecified: Secondary | ICD-10-CM | POA: Diagnosis not present

## 2011-05-31 DIAGNOSIS — J45909 Unspecified asthma, uncomplicated: Secondary | ICD-10-CM | POA: Diagnosis not present

## 2011-05-31 DIAGNOSIS — E119 Type 2 diabetes mellitus without complications: Secondary | ICD-10-CM | POA: Diagnosis not present

## 2011-06-15 DIAGNOSIS — K219 Gastro-esophageal reflux disease without esophagitis: Secondary | ICD-10-CM | POA: Diagnosis not present

## 2011-06-15 DIAGNOSIS — E119 Type 2 diabetes mellitus without complications: Secondary | ICD-10-CM | POA: Diagnosis not present

## 2011-06-15 DIAGNOSIS — J45909 Unspecified asthma, uncomplicated: Secondary | ICD-10-CM | POA: Diagnosis not present

## 2011-08-24 ENCOUNTER — Encounter (HOSPITAL_COMMUNITY): Payer: Self-pay | Admitting: *Deleted

## 2011-08-24 ENCOUNTER — Observation Stay (HOSPITAL_COMMUNITY)
Admission: EM | Admit: 2011-08-24 | Discharge: 2011-08-25 | Disposition: A | Discharge status: Home / Self Care | Payer: Medicare Other | Attending: Internal Medicine | Admitting: Internal Medicine

## 2011-08-24 ENCOUNTER — Emergency Department (HOSPITAL_COMMUNITY): Payer: Medicare Other

## 2011-08-24 ENCOUNTER — Observation Stay (HOSPITAL_COMMUNITY)
Admit: 2011-08-24 | Discharge: 2011-08-24 | Disposition: A | Discharge status: Home / Self Care | Payer: Medicare Other | Attending: Internal Medicine | Admitting: Internal Medicine

## 2011-08-24 DIAGNOSIS — F4489 Other dissociative and conversion disorders: Secondary | ICD-10-CM | POA: Diagnosis not present

## 2011-08-24 DIAGNOSIS — G40309 Generalized idiopathic epilepsy and epileptic syndromes, not intractable, without status epilepticus: Principal | ICD-10-CM | POA: Insufficient documentation

## 2011-08-24 DIAGNOSIS — IMO0001 Reserved for inherently not codable concepts without codable children: Secondary | ICD-10-CM | POA: Diagnosis not present

## 2011-08-24 DIAGNOSIS — G40909 Epilepsy, unspecified, not intractable, without status epilepticus: Secondary | ICD-10-CM

## 2011-08-24 DIAGNOSIS — F445 Conversion disorder with seizures or convulsions: Secondary | ICD-10-CM

## 2011-08-24 DIAGNOSIS — G40409 Other generalized epilepsy and epileptic syndromes, not intractable, without status epilepticus: Secondary | ICD-10-CM

## 2011-08-24 DIAGNOSIS — IMO0002 Reserved for concepts with insufficient information to code with codable children: Secondary | ICD-10-CM

## 2011-08-24 DIAGNOSIS — R51 Headache: Secondary | ICD-10-CM | POA: Diagnosis not present

## 2011-08-24 DIAGNOSIS — E119 Type 2 diabetes mellitus without complications: Secondary | ICD-10-CM | POA: Diagnosis not present

## 2011-08-24 DIAGNOSIS — F79 Unspecified intellectual disabilities: Secondary | ICD-10-CM | POA: Diagnosis not present

## 2011-08-24 DIAGNOSIS — E1165 Type 2 diabetes mellitus with hyperglycemia: Secondary | ICD-10-CM | POA: Diagnosis present

## 2011-08-24 DIAGNOSIS — R569 Unspecified convulsions: Secondary | ICD-10-CM | POA: Diagnosis not present

## 2011-08-24 DIAGNOSIS — Z79899 Other long term (current) drug therapy: Secondary | ICD-10-CM | POA: Insufficient documentation

## 2011-08-24 DIAGNOSIS — R7301 Impaired fasting glucose: Secondary | ICD-10-CM | POA: Diagnosis not present

## 2011-08-24 HISTORY — DX: Unspecified convulsions: R56.9

## 2011-08-24 HISTORY — DX: Unspecified intellectual disabilities: F79

## 2011-08-24 HISTORY — DX: Conversion disorder with seizures or convulsions: F44.5

## 2011-08-24 LAB — GLUCOSE, CAPILLARY
Glucose-Capillary: 357 mg/dL — ABNORMAL HIGH (ref 70–99)
Glucose-Capillary: 154 mg/dL — ABNORMAL HIGH (ref 70–99)

## 2011-08-24 LAB — CBC WITH DIFFERENTIAL/PLATELET
Basophils Absolute: 0 10*3/uL (ref 0.0–0.1)
Basophils Relative: 1 % (ref 0–1)
Eosinophils Absolute: 0.1 10*3/uL (ref 0.0–0.7)
Eosinophils Relative: 2 % (ref 0–5)
MCH: 27.3 pg (ref 26.0–34.0)
MCHC: 33.9 g/dL (ref 30.0–36.0)
MCV: 80.6 fL (ref 78.0–100.0)
Platelets: 170 10*3/uL (ref 150–400)
RDW: 13.8 % (ref 11.5–15.5)

## 2011-08-24 LAB — URINALYSIS, ROUTINE W REFLEX MICROSCOPIC
Glucose, UA: >1000 mg/dL — AB
Ketones, ur: NEGATIVE mg/dL
Leukocytes, UA: NEGATIVE
Nitrite: NEGATIVE
Protein, ur: 100 mg/dL — AB

## 2011-08-24 LAB — BASIC METABOLIC PANEL
CO2: 26 mEq/L (ref 19–32)
Calcium: 9.1 mg/dL (ref 8.4–10.5)
Creatinine, Ser: 0.4 mg/dL — ABNORMAL LOW (ref 0.50–1.10)
Glucose, Bld: 363 mg/dL — ABNORMAL HIGH (ref 70–99)

## 2011-08-24 LAB — URINE MICROSCOPIC-ADD ON

## 2011-08-24 LAB — HEMOGLOBIN A1C: Mean Plasma Glucose: 306 mg/dL — ABNORMAL HIGH (ref <117)

## 2011-08-24 LAB — LAMOTRIGINE LEVEL: Lamotrigine Lvl: <2 ug/mL — ABNORMAL LOW (ref 3.0–14.0)

## 2011-08-24 MED ORDER — SODIUM CHLORIDE 0.9 % IV SOLN
500.0000 mg | Freq: Once | INTRAVENOUS | Status: AC
  Administered 2011-08-24: 500 mg via INTRAVENOUS
  Filled 2011-08-24: qty 5

## 2011-08-24 MED ORDER — LAMOTRIGINE 150 MG PO TABS
150.0000 mg | ORAL_TABLET | Freq: Every day | ORAL | Status: DC
  Administered 2011-08-24 – 2011-08-25 (×2): 150 mg via ORAL
  Filled 2011-08-24 (×2): qty 1

## 2011-08-24 MED ORDER — ACETAMINOPHEN 650 MG RE SUPP: 650.0000 mg | Freq: Four times a day (QID) | RECTAL | Status: DC | PRN

## 2011-08-24 MED ORDER — SODIUM CHLORIDE 0.9 % IJ SOLN
3.0000 mL | Freq: Two times a day (BID) | INTRAMUSCULAR | Status: DC
  Administered 2011-08-24: 3 mL via INTRAVENOUS

## 2011-08-24 MED ORDER — ENOXAPARIN SODIUM 40 MG/0.4ML ~~LOC~~ SOLN
40.0000 mg | SUBCUTANEOUS | Status: DC
  Administered 2011-08-24 – 2011-08-25 (×2): 40 mg via SUBCUTANEOUS
  Filled 2011-08-24 (×2): qty 0.4

## 2011-08-24 MED ORDER — SODIUM CHLORIDE 0.9 % IV SOLN: 500.0000 mg | Freq: Once | INTRAVENOUS | Status: DC

## 2011-08-24 MED ORDER — ONDANSETRON HCL 4 MG/2ML IJ SOLN: 4.0000 mg | Freq: Three times a day (TID) | INTRAMUSCULAR | Status: AC | PRN

## 2011-08-24 MED ORDER — INSULIN REGULAR HUMAN 100 UNIT/ML IJ SOLN: 5.0000 [IU] | Freq: Once | INTRAMUSCULAR | Status: DC

## 2011-08-24 MED ORDER — LORAZEPAM 2 MG/ML IJ SOLN
INTRAMUSCULAR | Status: AC
  Administered 2011-08-24: 2 mg
  Filled 2011-08-24: qty 1

## 2011-08-24 MED ORDER — INSULIN ASPART 100 UNIT/ML ~~LOC~~ SOLN
5.0000 [IU] | Freq: Once | SUBCUTANEOUS | Status: AC
  Administered 2011-08-24: 5 [IU] via SUBCUTANEOUS
  Filled 2011-08-24: qty 1

## 2011-08-24 MED ORDER — SODIUM CHLORIDE 0.9 % IV BOLUS (SEPSIS)
1000.0000 mL | Freq: Once | INTRAVENOUS | Status: AC
  Administered 2011-08-24: 1000 mL via INTRAVENOUS

## 2011-08-24 MED ORDER — ADULT MULTIVITAMIN W/MINERALS CH
1.0000 | ORAL_TABLET | Freq: Every day | ORAL | Status: DC
  Administered 2011-08-24 – 2011-08-25 (×2): 1 via ORAL
  Filled 2011-08-24 (×2): qty 1

## 2011-08-24 MED ORDER — ACETAMINOPHEN 325 MG PO TABS
650.0000 mg | ORAL_TABLET | Freq: Four times a day (QID) | ORAL | Status: DC | PRN
  Administered 2011-08-24: 650 mg via ORAL
  Filled 2011-08-24: qty 2

## 2011-08-24 MED ORDER — SODIUM CHLORIDE 0.9 % IV SOLN: 1500.0000 mg | Freq: Two times a day (BID) | INTRAVENOUS | Status: DC

## 2011-08-24 MED ORDER — ONDANSETRON HCL 4 MG PO TABS: 4.0000 mg | ORAL_TABLET | Freq: Four times a day (QID) | ORAL | Status: DC | PRN

## 2011-08-24 MED ORDER — VITAMIN C 100 MG PO TABS: 100.0000 mg | ORAL_TABLET | Freq: Every day | ORAL | Status: DC

## 2011-08-24 MED ORDER — SODIUM CHLORIDE 0.9 % IV SOLN
INTRAVENOUS | Status: DC
  Administered 2011-08-24: 11:00:00 via INTRAVENOUS

## 2011-08-24 MED ORDER — SODIUM CHLORIDE 0.9 % IV SOLN
1500.0000 mg | Freq: Two times a day (BID) | INTRAVENOUS | Status: DC
  Administered 2011-08-24: 1500 mg via INTRAVENOUS
  Filled 2011-08-24 (×3): qty 15

## 2011-08-24 MED ORDER — MORPHINE SULFATE 2 MG/ML IJ SOLN: 1.0000 mg | INTRAMUSCULAR | Status: DC | PRN

## 2011-08-24 MED ORDER — VITAMIN E 180 MG (400 UNIT) PO CAPS
400.0000 [IU] | ORAL_CAPSULE | Freq: Every day | ORAL | Status: DC
Start: 2011-08-24 — End: 2011-08-25
  Administered 2011-08-24 – 2011-08-25 (×2): 400 [IU] via ORAL
  Filled 2011-08-24 (×2): qty 1

## 2011-08-24 MED ORDER — SODIUM CHLORIDE 0.9 % IV SOLN: INTRAVENOUS | Status: AC

## 2011-08-24 MED ORDER — ONDANSETRON HCL 4 MG/2ML IJ SOLN: 4.0000 mg | Freq: Four times a day (QID) | INTRAMUSCULAR | Status: DC | PRN

## 2011-08-24 MED ORDER — LORAZEPAM 1 MG PO TABS: 1.0000 mg | ORAL_TABLET | Freq: Once | ORAL | Status: DC

## 2011-08-24 MED ORDER — VITAMIN C 250 MG PO TABS
125.0000 mg | ORAL_TABLET | Freq: Every day | ORAL | Status: DC
  Administered 2011-08-24 – 2011-08-25 (×2): 125 mg via ORAL
  Filled 2011-08-24 (×2): qty 1

## 2011-08-24 MED ORDER — FERROUS FUMARATE 325 (106 FE) MG PO TABS
1.0000 | ORAL_TABLET | Freq: Every day | ORAL | Status: DC
  Administered 2011-08-24 – 2011-08-25 (×2): 106 mg via ORAL
  Filled 2011-08-24 (×2): qty 1

## 2011-08-24 MED ORDER — LORAZEPAM 2 MG/ML IJ SOLN: 1.0000 mg | INTRAMUSCULAR | Status: DC | PRN

## 2011-08-24 NOTE — ED Notes (Signed)
WUJ:WJ19<JY> Expected date:08/24/11<BR> Expected time: 4:34 AM<BR> Means of arrival:Ambulance<BR> Comments:<BR> Seizure

## 2011-08-24 NOTE — ED Notes (Signed)
Report received-airway intact-no s/s's of distress/seizure activity-will continue to monitor

## 2011-08-24 NOTE — ED Notes (Signed)
Please call mom, Odetta Forness, when pt gets a room assignment. [phone]

## 2011-08-24 NOTE — ED Notes (Signed)
Pt had a witnessed seizure at 0535 that lasted approximately 1.5 minutes in duration. Pt's right leg twitched and bilateral eyes fluttered during seizure activity.

## 2011-08-24 NOTE — ED Notes (Signed)
Patient transported to CT 

## 2011-08-24 NOTE — ED Provider Notes (Addendum)
History     CSN: [id number]  Arrival date & time 08/24/11  0455   First MD Initiated Contact with Patient 08/24/11 0501      Chief Complaint  Patient presents with  . Seizures    (Consider location/radiation/quality/duration/timing/severity/associated sxs/prior treatment) HPI Comments: Pt has hx of seziure disorder and states that she had a seizure this evening.  She states that she has not missed any of her medications, she takes Keppra, Lamictal, and one other seizure medicine which she cannot remember the name of. family members witnessed a seizure stating that it was generalized tonic-clonic in nature. The patient denies biting her tongue, denies urinary incontinence, denies alcohol use. She states that she does have occasional seizures and this is not unusual for her. She denies any recent changes in the doses of her medications.  The symptoms were acute in onset, lasted 5 minutes and terminated spontaneously. She states that when she went to sleep the night before there is no symptoms including headache, stiff neck, fever, weakness, numbness, rash, swelling, chest pain, palpitations, shortness of breath.  The patient's speech is slow, sometimes a question as to be asked twice    Patient is a 46 y.o. female presenting with seizures. The history is provided by the patient and the EMS personnel.  Seizures     Past Medical History  Diagnosis Date  . Seizures   . Diabetes mellitus   . Cancer   . Breast cancer   . Lung cancer   . Mental retardation   . Pseudoseizures     History reviewed. No pertinent past surgical history.  No family history on file.  History  Substance Use Topics  . Smoking status: Not on file  . Smokeless tobacco: Not on file  . Alcohol Use:     OB History    Grav Para Term Preterm Abortions TAB SAB Ect Mult Living                  Review of Systems  Neurological: Positive for seizures.  All other systems reviewed and are  negative.    Allergies  Dilantin; Penicillins; and Shellfish allergy  Home Medications   Current Outpatient Rx  Name Route Sig Dispense Refill  . VITAMIN C 100 MG PO TABS Oral Take 100 mg by mouth daily.    Marland Kitchen FERROUS FUMARATE 325 (106 FE) MG PO TABS Oral Take 1 tablet by mouth.    Marland Kitchen LAMOTRIGINE 150 MG PO TABS Oral Take 150 mg by mouth daily.    Marland Kitchen LEVETIRACETAM 250 MG PO TABS Oral Take 1,000 mg by mouth 2 (two) times daily.    . ADULT MULTIVITAMIN W/MINERALS CH Oral Take 1 tablet by mouth daily.    Marland Kitchen VITAMIN E 400 UNITS PO CAPS Oral Take 400 Units by mouth daily.      BP 130/69  Pulse 95  Temp 98.9 F (37.2 C) (Oral)  Resp 16  SpO2 100%  Physical Exam  Nursing note and vitals reviewed. Constitutional: She appears well-developed and well-nourished. No distress.  HENT:  Head: Normocephalic and atraumatic.  Mouth/Throat: Oropharynx is clear and moist. No oropharyngeal exudate.  Eyes: Conjunctivae and EOM are normal. Pupils are equal, round, and reactive to light. Right eye exhibits no discharge. Left eye exhibits no discharge. No scleral icterus.  Neck: Normal range of motion. Neck supple. No JVD present. No thyromegaly present.  Cardiovascular: Normal rate, regular rhythm, normal heart sounds and intact distal pulses.  Exam reveals no gallop  and no friction rub.   No murmur heard. Pulmonary/Chest: Effort normal and breath sounds normal. No respiratory distress. She has no wheezes. She has no rales.  Abdominal: Soft. Bowel sounds are normal. She exhibits no distension and no mass. There is no tenderness.  Musculoskeletal: Normal range of motion. She exhibits no edema and no tenderness.  Lymphadenopathy:    She has no cervical adenopathy.  Neurological: She is alert. Coordination normal.       Neurologic exam:  Speech clear, pupils equal round reactive to light, extraocular movements intact  Normal peripheral visual fields Cranial nerves III through XII normal including no  facial droop Follows commands, moves all extremities x4, normal strength to bilateral upper and lower extremities at all major muscle groups including grip Sensation normal to light touch and pinprick Coordination intact, no limb ataxia, finger-nose-finger normal Rapid alternating movements normal No pronator drift Gait normal   Skin: Skin is warm and dry. No rash noted. No erythema.  Psychiatric: She has a normal mood and affect. Her behavior is normal.    ED Course  Procedures (including critical care time)  Labs Reviewed  GLUCOSE, CAPILLARY - Abnormal; Notable for the following:    Glucose-Capillary 357 (*)     All other components within normal limits  BASIC METABOLIC PANEL - Abnormal; Notable for the following:    Sodium 131 (*)     Chloride 94 (*)     Glucose, Bld 363 (*)     Creatinine, Ser 0.40 (*)     All other components within normal limits  CBC WITH DIFFERENTIAL - Abnormal; Notable for the following:    WBC 3.6 (*)     All other components within normal limits  URINALYSIS, ROUTINE W REFLEX MICROSCOPIC - Abnormal; Notable for the following:    Glucose, UA >1000 (*)     Protein, ur 100 (*)     All other components within normal limits  URINE MICROSCOPIC-ADD ON - Abnormal; Notable for the following:    Squamous Epithelial / LPF FEW (*)     Bacteria, UA FEW (*)     Casts HYALINE CASTS (*)     All other components within normal limits  PREGNANCY, URINE   Ct Head Wo Contrast  08/24/2011  *RADIOLOGY REPORT*  Clinical Data: Post seizures, history of MR and severe headache  CT HEAD WITHOUT CONTRAST  Technique:  Contiguous axial images were obtained from the base of the skull through the vertex without contrast.  Comparison: Head CT - 11/01/2010; 08/06/2007  Findings: Wallace Cullens white differentiation is maintained.  No CT evidence to large territory infarct.  No intraparenchymal or extra-axial mass or hemorrhage.  Normal sized configuration of ventricles and basilar cisterns.  No  midline shift.  Minimal increase in polypoid mucosal thickening within the left maxillary sinus.  Remaining paranasal sinuses and mastoid air cells are normal.  Regional soft tissues are normal.  No displaced calvarial fracture.  IMPRESSION: 1.  Negative noncontrast head CT. 2.  Minimal increase in polypoid mucosal thickening within the left maxillary sinus.  Original Report Authenticated By: Waynard Reeds, M.D.     1. Seizure       MDM  Patient appears improving at this time, has no seizure activity on arrival and the EMS did not note any seizure activity and route. Her mental status is improved from EMS initial evaluation but the patient is still not back to normal. Vital signs are normal, the patient has no signs of meningismus  or focal neurologic deficits, hyperglycemia on CBG confirmed on arrival at 357.  IV fluids and insulin ordered  During the patient's emergency department evaluation she had a recurrent seizure which appeared to be a right-sided focal seizure with tonic-clonic activity of the arm and leg with an eventual decrease in mental status and generalized for seizure activity.  Knowledge in vault, Ativan given  Discussed with Dr. Roseanne Reno of neurology who agrees with admission to the hospital for observation given her persistent altered mental status and recurrent seizure activity prior to returning to baseline. He recommends 1 g of Keppra which has been ordered. Since Ativan given, no recurrent seizures. Patient at this time is somnolent and difficult to arouse.]  Discussed with hospitalist who will admit for observation, temporary holding orders placed      Vida Roller, MD 08/24/11 1610  Vida Roller, MD 08/24/11 [phone]

## 2011-08-24 NOTE — H&P (Signed)
[facility] History and Physical  Tammy Arellano ZOX:[id number] DOB: [birth year] DOA: 08/24/2011  Referring physician: Eber Hong PCP: Juline Patch, MD   Chief Complaint: Tonic clonic seizure  HPI:  Comment bulk is 46 year old African American female with past medical history of seizures, diabetes mellitus and mild developmental delay. Patient brought to the hospital because of generalized seizures. Patient couldn't tell what happened at home but apparently she did season she was brought by EMT to the hospital. In the emergency department she had another seizure which is started as partial and then generalized, witnessed by the ED physician and other ED staff. Patient was given Ativan, and neurology was consulted and they recommended to give Keppra and admitted to the internal medicine service for overnight observation. Patient still sleepy but fully awake and alert when I interviewed her. No airway compromise.  Review of Systems:  Constitutional: Denies any recent weight loss  Eyes: negative for irritation, redness and visual disturbance  Ears, nose, mouth, throat, and face: negative for earaches, epistaxis, nasal congestion and sore throat  Respiratory: negative for cough, dyspnea on exertion, sputum and wheezing  Cardiovascular: negative for chest pain, dyspnea, lower extremity edema, orthopnea, palpitations and syncope  Gastrointestinal: negative for abdominal pain, constipation, diarrhea, melena, nausea and vomiting  Genitourinary:negative for dysuria, frequency and hematuria  Hematologic/lymphatic: negative for bleeding, easy bruising and lymphadenopathy  Musculoskeletal:negative for arthralgias, muscle weakness and stiff joints  Neurological: negative for coordination problems, gait problems, headaches and weakness  Endocrine: negative for diabetic symptoms including polydipsia, polyuria and weight loss  Allergic/Immunologic: negative for anaphylaxis, hay fever and  urticaria   Past Medical History  Diagnosis Date  . Seizures   . Diabetes mellitus   . Mental retardation   . Pseudoseizures    History reviewed. No pertinent past surgical history. Social History:  does not have a smoking history on file. She does not have any smokeless tobacco history on file. Her alcohol and drug histories not on file. She lives at home with her mother, normal functional status,  no problem with a ambulation.  Allergies  Allergen Reactions  . Dilantin (Phenytoin Sodium Extended)     seizures   . Penicillins   . Shellfish Allergy Rash    Family History  Problem Relation Age of Onset  . Osteoarthritis Mother   . Diabetes Mother   . Cancer Father    Prior to Admission medications   Medication Sig Start Date End Date Taking? Authorizing Provider  Ascorbic Acid (VITAMIN C) 100 MG tablet Take 100 mg by mouth daily.   Yes Historical Provider, MD  ferrous fumarate (HEMOCYTE - 106 MG FE) 325 (106 FE) MG TABS Take 1 tablet by mouth.   Yes Historical Provider, MD  lamoTRIgine (LAMICTAL) 150 MG tablet Take 150 mg by mouth daily.   Yes Historical Provider, MD  levETIRAcetam (KEPPRA) 250 MG tablet Take 1,000 mg by mouth 2 (two) times daily.   Yes Historical Provider, MD  Multiple Vitamin (MULTIVITAMIN WITH MINERALS) TABS Take 1 tablet by mouth daily.   Yes Historical Provider, MD  vitamin E 400 UNIT capsule Take 400 Units by mouth daily.   Yes Historical Provider, MD   Physical Exam: Filed Vitals:   08/24/11 0502 08/24/11 0608 08/24/11 0721  BP: 143/85 102/76 130/69  Pulse: 99 97 95  Temp: 98.9 F (37.2 C)    TempSrc: Oral    Resp: 14 20 16   SpO2: 100% 100% 100%   General appearance: alert, cooperative ,  but sleepy Head: Normocephalic, without obvious abnormality, atraumatic  Eyes: conjunctivae/corneas clear. PERRL, EOM's intact. Fundi benign.  Nose: Nares normal. Septum midline. Mucosa normal. No drainage or sinus tenderness.  Throat: lips, mucosa, and  tongue normal; teeth and gums normal  Neck: Supple, no masses, no cervical lymphadenopathy, no JVD appreciated, no meningeal signs  Resp: clear to auscultation bilaterally  Chest wall: no tenderness  Cardio: regular rate and rhythm, S1, S2 normal, no murmur, click, rub or gallop  GI: soft, non-tender; bowel sounds normal; no masses, no organomegaly  Extremities: extremities normal, atraumatic, no cyanosis or edema  Skin: Skin color, texture, turgor normal. No rashes or lesions  Neurologic: Alert and oriented X 3, but she still cannot remember a lot of things.  Labs on Admission:  Basic Metabolic Panel:  Lab 08/24/11 9562  NA 131*  K 4.9  CL 94*  CO2 26  GLUCOSE 363*  BUN 10  CREATININE 0.40*  CALCIUM 9.1  MG --  PHOS --   Liver Function Tests: No results found for this basename: AST:5,ALT:5,ALKPHOS:5,BILITOT:5,PROT:5,ALBUMIN:5 in the last 168 hours No results found for this basename: LIPASE:5,AMYLASE:5 in the last 168 hours No results found for this basename: AMMONIA:5 in the last 168 hours CBC:  Lab 08/24/11 0515  WBC 3.6*  NEUTROABS 1.7  HGB 13.1  HCT 38.6  MCV 80.6  PLT 170   Cardiac Enzymes: No results found for this basename: CKTOTAL:5,CKMB:5,CKMBINDEX:5,TROPONINI:5 in the last 168 hours  BNP (last 3 results) No results found for this basename: PROBNP:3 in the last 8760 hours CBG:  Lab 08/24/11 0512  GLUCAP 357*    Radiological Exams on Admission: Ct Head Wo Contrast  08/24/2011  *RADIOLOGY REPORT*  Clinical Data: Post seizures, history of MR and severe headache  CT HEAD WITHOUT CONTRAST  Technique:  Contiguous axial images were obtained from the base of the skull through the vertex without contrast.  Comparison: Head CT - 11/01/2010; 08/06/2007  Findings: Wallace Cullens white differentiation is maintained.  No CT evidence to large territory infarct.  No intraparenchymal or extra-axial mass or hemorrhage.  Normal sized configuration of ventricles and basilar cisterns.   No midline shift.  Minimal increase in polypoid mucosal thickening within the left maxillary sinus.  Remaining paranasal sinuses and mastoid air cells are normal.  Regional soft tissues are normal.  No displaced calvarial fracture.  IMPRESSION: 1.  Negative noncontrast head CT. 2.  Minimal increase in polypoid mucosal thickening within the left maxillary sinus.  Original Report Authenticated By: Waynard Reeds, M.D.    EKG: Independently reviewed.   Assessment/Plan Active Problems:  DM2 (diabetes mellitus, type 2)  Seizure disorder  Pseudoseizures  Mental retardation  Tonic clonic seizures   Tonic clonic seizure Patient does have history of seizure disorder, she is on Keppra and Lamictal. Reported to have 2 episodes of tonic-clonic seizure, neurology was contacted by the emergency department physician and they recommended to switch over to IV so she'll get a dose in the hospital here and recommended to observe overnight. I'll restart her medication, procedure precautions and to check Lamictal level, obtain EEG.  Diabetes mellitus type 2 I will check hemoglobin A1c, she is on Actos and metformin I will restart these medications, put her on carbohydrate modified diet. Patient has hyperglycemia now it is causing pseudohyponatremia as well.  Mental retardation Patient has mild developmental delay, she lives at home with her mother.  Code Status: Full  Family Communication: I will talk to her mother today. Disposition  Plan: Home when she is medical stable  Time spent: 60 minutes  [facility] A [facility] Pager [phone]  If 7PM-7AM, please contact night-coverage [email or web address] Password [email or web address] 08/24/2011, 8:08 AM

## 2011-08-24 NOTE — ED Notes (Signed)
Pt from home. Was in bed and had a 5 minute seizure per mom's report. [facility] did not witness seizure activity. Pt was post ictal upon EMS arrival and was fully alert and oriented upon arrival to the ER. Pt denies pain or hitting head with seizure activity.

## 2011-08-24 NOTE — ED Notes (Signed)
Light green, dark green, lavender, and red tubes sent to lab as well as a urine sample.

## 2011-08-24 NOTE — ED Notes (Signed)
Pt has history of seizures. Had a 5 minute witnessed seizure. Pt was in bed, did not fall or incur oral trauma. Pt alert and oriented x 4 upon arrival to ER. Pt denies pain or SOB at this time. Pt is a diabetic and CBG was 341 per EMS.

## 2011-08-24 NOTE — Consult Note (Signed)
[facility] CONSULT NOTE     Reason for Consult: seizure    HPI:    Tammy Arellano is an 46 y.o. female  has a past medical history of Seizures; Diabetes mellitus; Mental retardation; and Pseudoseizures. Per chart patient was brought to the ED with generalized seizure. Patient at present time is very sleeping and only giving limited history.  She states her last seizure was about 2 months ago and prior to that it had been a long time.  She sees Dr. Suzie Portela for primary care who prescribes her medication.  She recently was switched to a generic form of AED but is unsure if it was her Lamictal or Keppra. Unfortunately there are no office notes or recent notes in Epic.   Looking back in chart 2009 --Dr. Vickey Huger noted her seizures have always been generalized since childhood, and she had been on Depakote and Keppra until 18 months ago when she developed leukopenia and was also gaining rapidly weight. She was changed from Depakote to Lamictal. At that time she was on Keppra 2000 mg daily and Lamictal 150 mg in AM.   Patients Sodium is 131 and Glucose is 357.  Past Medical History  Diagnosis Date  . Seizures   . Diabetes mellitus   . Mental retardation   . Pseudoseizures     History reviewed. No pertinent past surgical history.  Family History  Problem Relation Age of Onset  . Osteoarthritis Mother   . Diabetes Mother   . Cancer Father     Social History:  does not have a smoking history on file. She does not have any smokeless tobacco history on file. Her alcohol and drug histories not on file.  Allergies  Allergen Reactions  . Dilantin (Phenytoin Sodium Extended)     seizures   . Penicillins   . Shellfish Allergy Rash    Medications:    Current Facility-Administered Medications  Medication Dose Route Frequency Provider Last Rate Last Dose  . insulin aspart (novoLOG) injection 5 Units  5 Units Subcutaneous Once Clydia Llano, MD   5 Units at  08/24/11 0813  . levETIRAcetam (KEPPRA) 500 mg in sodium chloride 0.9 % 100 mL IVPB  500 mg Intravenous Once Vida Roller, MD   500 mg at 08/24/11 0655  . levETIRAcetam (KEPPRA) 500 mg in sodium chloride 0.9 % 100 mL IVPB  500 mg Intravenous Once Vida Roller, MD   500 mg at 08/24/11 0813  . LORazepam (ATIVAN) 2 MG/ML injection        2 mg at 08/24/11 0538  . sodium chloride 0.9 % bolus 1,000 mL  1,000 mL Intravenous Once Vida Roller, MD   1,000 mL at 08/24/11 0655  . DISCONTD: insulin regular (NOVOLIN R,HUMULIN R) 100 units/mL injection 5 Units  5 Units Subcutaneous Once Vida Roller, MD      . DISCONTD: LORazepam (ATIVAN) tablet 1 mg  1 mg Oral Once Vida Roller, MD       Current Outpatient Prescriptions  Medication Sig Dispense Refill  . Ascorbic Acid (VITAMIN C) 100 MG tablet Take 100 mg by mouth daily.      . ferrous fumarate (HEMOCYTE - 106 MG FE) 325 (106 FE) MG TABS Take 1 tablet by mouth.      . lamoTRIgine (LAMICTAL) 150 MG tablet Take 150 mg by mouth daily.      Marland Kitchen  levETIRAcetam (KEPPRA) 250 MG tablet Take 1,000 mg by mouth 2 (two) times daily.      . Multiple Vitamin (MULTIVITAMIN WITH MINERALS) TABS Take 1 tablet by mouth daily.      . vitamin E 400 UNIT capsule Take 400 Units by mouth daily.        Review of Systems - General ROS: negative for - chills, fatigue, fever or hot flashes Hematological and Lymphatic ROS: negative for - bruising, fatigue, jaundice or pallor Endocrine ROS: negative for - hair pattern changes, hot flashes, mood swings or skin changes Respiratory ROS: negative for - cough, hemoptysis, orthopnea or wheezing Cardiovascular ROS: negative for - dyspnea on exertion, orthopnea, palpitations or shortness of breath Gastrointestinal ROS: negative for - abdominal pain, appetite loss, blood in stools, diarrhea or hematemesis Musculoskeletal ROS: negative for - joint pain, joint stiffness, joint swelling or muscle pain Neurological ROS: positive for -  seizures Dermatological ROS: negative for dry skin, pruritus and rash   Blood pressure 142/71, pulse 91, temperature 98.2 F (36.8 C), temperature source Oral, resp. rate 18, SpO2 100.00%.   Neurologic Examination:   Mental Status: Alert, oriented to place.  Will quickly fall asleep if not continuously stimulated. Speech fluent without evidence of aphasia. Able to follow 2 step commands without difficulty.   Cranial Nerves: II-Visual fields grossly intact. III/IV/VI-Extraocular movements intact.  Pupils reactive bilaterally. Ptosis not present. V/VII-Smile symmetric VIII-grossly intact XI-bilateral shoulder shrug XII-midline tongue extension--left border of tongue was bitten.  Motor: Able to lift both arms and legs antigravity with 4/5 strength.  No drift noted. No tremor or asterixis noted.  Sensory:  light touch intact throughout, bilaterally Deep Tendon Reflexes: 2+ UE, 2+ KJ and AJ and symmetric throughout.  When testing the KJ she had cross adductor response however she is post ictal at this time.  Plantars:      Right:  downgoing     Left:  Downgoing Cerebellar: Normal finger-to-nose,       No results found for this basename: cbc, bmp, coags, chol, tri, ldl, hga1c    Results for orders placed during the hospital encounter of 08/24/11 (from the past 48 hour(s))  GLUCOSE, CAPILLARY     Status: Abnormal   Collection Time   08/24/11  5:12 AM      Component Value Range Comment   Glucose-Capillary 357 (*) 70 - 99 mg/dL    Comment 1 Documented in Chart      Comment 2 Notify RN     BASIC METABOLIC PANEL     Status: Abnormal   Collection Time   08/24/11  5:15 AM      Component Value Range Comment   Sodium 131 (*) 135 - 145 mEq/L    Potassium 4.9  3.5 - 5.1 mEq/L MODERATE HEMOLYSIS   Chloride 94 (*) 96 - 112 mEq/L    CO2 26  19 - 32 mEq/L    Glucose, Bld 363 (*) 70 - 99 mg/dL    BUN 10  6 - 23 mg/dL    Creatinine, Ser 9.60 (*) 0.50 - 1.10 mg/dL    Calcium 9.1  8.4 - 45.4  mg/dL    GFR calc non Af Amer >90  >90 mL/min    GFR calc Af Amer >90  >90 mL/min   CBC WITH DIFFERENTIAL     Status: Abnormal   Collection Time   08/24/11  5:15 AM      Component Value Range Comment   WBC 3.6 (*) 4.0 -  10.5 K/uL    RBC 4.79  3.87 - 5.11 MIL/uL    Hemoglobin 13.1  12.0 - 15.0 g/dL    HCT 16.1  09.6 - 04.5 %    MCV 80.6  78.0 - 100.0 fL    MCH 27.3  26.0 - 34.0 pg    MCHC 33.9  30.0 - 36.0 g/dL    RDW 40.9  81.1 - 91.4 %    Platelets 170  150 - 400 K/uL    Neutrophils Relative 49  43 - 77 %    Neutro Abs 1.7  1.7 - 7.7 K/uL    Lymphocytes Relative 41  12 - 46 %    Lymphs Abs 1.5  0.7 - 4.0 K/uL    Monocytes Relative 8  3 - 12 %    Monocytes Absolute 0.3  0.1 - 1.0 K/uL    Eosinophils Relative 2  0 - 5 %    Eosinophils Absolute 0.1  0.0 - 0.7 K/uL    Basophils Relative 1  0 - 1 %    Basophils Absolute 0.0  0.0 - 0.1 K/uL   URINALYSIS, ROUTINE W REFLEX MICROSCOPIC     Status: Abnormal   Collection Time   08/24/11  5:33 AM      Component Value Range Comment   Color, Urine YELLOW  YELLOW    APPearance CLEAR  CLEAR    Specific Gravity, Urine 1.028  1.005 - 1.030    pH 6.0  5.0 - 8.0    Glucose, UA >1000 (*) NEGATIVE mg/dL    Hgb urine dipstick NEGATIVE  NEGATIVE    Bilirubin Urine NEGATIVE  NEGATIVE    Ketones, ur NEGATIVE  NEGATIVE mg/dL    Protein, ur 782 (*) NEGATIVE mg/dL    Urobilinogen, UA 0.2  0.0 - 1.0 mg/dL    Nitrite NEGATIVE  NEGATIVE    Leukocytes, UA NEGATIVE  NEGATIVE   PREGNANCY, URINE     Status: Normal   Collection Time   08/24/11  5:33 AM      Component Value Range Comment   Preg Test, Ur NEGATIVE  NEGATIVE   URINE MICROSCOPIC-ADD ON     Status: Abnormal   Collection Time   08/24/11  5:33 AM      Component Value Range Comment   Squamous Epithelial / LPF FEW (*) RARE    WBC, UA 3-6  <3 WBC/hpf    RBC / HPF 0-2  <3 RBC/hpf    Bacteria, UA FEW (*) RARE    Casts HYALINE CASTS (*) NEGATIVE    Urine-Other MUCOUS PRESENT       Ct Head Wo  Contrast  08/24/2011  *RADIOLOGY REPORT*  Clinical Data: Post seizures, history of MR and severe headache  CT HEAD WITHOUT CONTRAST  Technique:  Contiguous axial images were obtained from the base of the skull through the vertex without contrast.  Comparison: Head CT - 11/01/2010; 08/06/2007  Findings: Wallace Cullens white differentiation is maintained.  No CT evidence to large territory infarct.  No intraparenchymal or extra-axial mass or hemorrhage.  Normal sized configuration of ventricles and basilar cisterns.  No midline shift.  Minimal increase in polypoid mucosal thickening within the left maxillary sinus.  Remaining paranasal sinuses and mastoid air cells are normal.  Regional soft tissues are normal.  No displaced calvarial fracture.  IMPRESSION: 1.  Negative noncontrast head CT. 2.  Minimal increase in polypoid mucosal thickening within the left maxillary sinus.  Original Report Authenticated By: Mayra Neer.  Judithann Sheen, M.D.     Assessment/Plan:   46 YO female with history of seizure and pseudoseizure presenting with breakthrough seizure in setting of hyperglycemia (357) and hyponatremia (131). CT head negative for blood or acute infarct. Per patient her seizure medications have been monitored by Dr. Pain (PCP) and she does not see a neurologist.    Note: frome chart in 2009 from Dr. Vickey Huger, patient had been on Depakote in past but developed leucopenia and rapid weight gain.  She was switched to Keppra 2000 mg BID and Lamictal at that date.   Recommend: 1) Increase her Keppra to 1,500 mg BID 2) Continue current dose of Lamictal 3) Correct hyperglycemia and Hyponatremia 4) Have follow up with neurologist out patient to follow AED at discharge   Felicie Morn PA-C [facility] [phone]  08/24/2011, 8:26 AM

## 2011-08-24 NOTE — ED Notes (Signed)
Report given, transfer to 1413

## 2011-08-25 DIAGNOSIS — IMO0001 Reserved for inherently not codable concepts without codable children: Secondary | ICD-10-CM

## 2011-08-25 DIAGNOSIS — F79 Unspecified intellectual disabilities: Secondary | ICD-10-CM | POA: Diagnosis not present

## 2011-08-25 DIAGNOSIS — E1165 Type 2 diabetes mellitus with hyperglycemia: Secondary | ICD-10-CM | POA: Diagnosis present

## 2011-08-25 DIAGNOSIS — IMO0002 Reserved for concepts with insufficient information to code with codable children: Secondary | ICD-10-CM | POA: Diagnosis present

## 2011-08-25 DIAGNOSIS — G40309 Generalized idiopathic epilepsy and epileptic syndromes, not intractable, without status epilepticus: Secondary | ICD-10-CM | POA: Diagnosis not present

## 2011-08-25 LAB — CBC
MCH: 27.2 pg (ref 26.0–34.0)
MCHC: 33.2 g/dL (ref 30.0–36.0)
Platelets: 184 10*3/uL (ref 150–400)
RBC: 4.6 MIL/uL (ref 3.87–5.11)

## 2011-08-25 LAB — COMPREHENSIVE METABOLIC PANEL
AST: 16 U/L (ref 0–37)
BUN: 6 mg/dL (ref 6–23)
CO2: 26 mEq/L (ref 19–32)
Chloride: 101 mEq/L (ref 96–112)
Creatinine, Ser: 0.43 mg/dL — ABNORMAL LOW (ref 0.50–1.10)
GFR calc Af Amer: >90 mL/min (ref >90)
GFR calc non Af Amer: >90 mL/min (ref >90)
Glucose, Bld: 178 mg/dL — ABNORMAL HIGH (ref 70–99)
Total Bilirubin: 0.5 mg/dL (ref 0.3–1.2)

## 2011-08-25 LAB — GLUCOSE, CAPILLARY: Glucose-Capillary: 142 mg/dL — ABNORMAL HIGH (ref 70–99)

## 2011-08-25 MED ORDER — PIOGLITAZONE HCL 30 MG PO TABS: 15.0000 mg | ORAL_TABLET | Freq: Two times a day (BID) | ORAL | Status: DC

## 2011-08-25 MED ORDER — INSULIN GLARGINE 100 UNIT/ML ~~LOC~~ SOLN: 25.0000 [IU] | Freq: Every day | SUBCUTANEOUS | Status: DC

## 2011-08-25 MED ORDER — METFORMIN HCL 850 MG PO TABS
850.0000 mg | ORAL_TABLET | Freq: Two times a day (BID) | ORAL | Status: DC
Start: 2011-08-25 — End: 2011-11-05

## 2011-08-25 MED ORDER — PIOGLITAZONE HCL 30 MG PO TABS: 30.0000 mg | ORAL_TABLET | Freq: Every day | ORAL | Status: DC

## 2011-08-25 MED ORDER — LEVETIRACETAM 500 MG PO TABS
1000.0000 mg | ORAL_TABLET | Freq: Once | ORAL | Status: AC
  Administered 2011-08-25: 1000 mg via ORAL
  Filled 2011-08-25 (×2): qty 2

## 2011-08-25 NOTE — Procedures (Signed)
EEG: [id number]  This routine EEG was requested in this 46 year old women with mental retardation and a seizure disorder.  Patient had a recent seizure in ED and is hard to arouse.  Medications include Keppra, Ativan and Lamictal.  The EEG was done with the patient drowsy and difficulty to arouse.  Background activities during drowsiness were a mixture of delta and theta activities as well as frontally dominant alpha activities.  They were symmetric.  Photic stimulation did not produce a driving response.  The patient was not hyperventilated.  During a very brief period of wakefulness she had activities composed of low amplitude diffuse beta activities.  No alpha rhythm was seen.  Clinical Interpretation:  This routine EEG done with the patient difficult to arouse, is normal.  A repeat EEG when the patient is awake my offer more information.  Lupita Raider Modesto Charon, MD [facility] Neurology, [place]

## 2011-08-25 NOTE — Discharge Summary (Addendum)
Physician Discharge Summary  Tammy Arellano:[id number] DOB: Apr 19, 1965 DOA: 08/24/2011  PCP: Juline Patch, MD Primary Neurologist : Lesia Sago, MD [facility] neurology)  Admit date: 08/24/2011 Discharge date: 08/25/2011  Recommendations for Outpatient Follow-up:  1. Follow up visit with PCP in 1 week to have blood glucose checked and medication adjusted. Her actos and metformin dose have been increased to 30 mg once daily and 850 mg bid respectively. Her lantus dose has been increased to 25 units at bedtime ( takes 15-20 u bedtime). She needs to be discussed about adding premeal insulin for better fsg control. i have not started it on discharge due to lack of clarity on her compliance.   Discharge Diagnoses:  Principal Problem:  *Tonic clonic seizures  Active Problems:  Uncontrolled diabetes mellitus  Seizure disorder  Pseudoseizures  mild Mental retardation   Discharge Condition: good  Diet recommendation: diabetic diet  Wt Readings from Last 3 Encounters:  08/24/11 106.4 kg (234 lb 9.1 oz)    History of present illness:  46 year old African American female with past medical history of seizures, diabetes mellitus and mild developmental delay brought to the hospital because of generalized seizures. Patient couldn't tell what happened at home but apparently she did season she was brought by EMT to the hospital. In the emergency department she had another seizure which is started as partial and then generalized, witnessed by the ED physician and other ED staff. Patient was given Ativan, and neurology was consulted and they recommended to give Keppra and admitted to the internal medicine service for overnight observation   Hospital Course:   Acute seizures: Patient admtted to hostpialist service for overnight obsetrvation. She was given IV keppra ( dose increaed to 1500 mg bid as per neurology recommendations) and continued on her home dose of lamictal. She was also noted to  be mildly hyponatremia anc afsg of 350s. She had no further seizure activity. An EEG was obtained and was negative for acute seizure activity. On discussiong with her mother this morning, i was infrd that she missed out 2 days dose of both keppra and lamictal which likely caused the seizures. i have spoken with hr outpatient neurologist Dr Anne Hahn and he recommends sending her back on her home dose of AED as seizure could be due to missing her dose. He will see her in 2 weks and a follow up has been scheduled. She has been counseled on being compliant with her AED and not missing out on the dosage.  Uncontrolled DM She also had uncontrolled DM with fsg of 350s on presentation . A1C was noted to be 12.3. She informs her fsg to be around 150s usually and takes lantus 15-20 units at bedtime along with metformin 850 mg bid and actos 15 mg bid. i will increase her lantus dose at bedtime to 25 units. She is informed to closely monitor her fsg and adhere to her diet and carry out routine exercise. In have not started  her on premeal insulin which will benefit her further to control her fsg better. i am not sue about her  compliance on this and this should be addressed by her PCP when she follows after discharge.   Procedures:  EEG ( 7/30) Clinical Interpretation: This routine EEG done with the patient difficult to arouse, is normal. A repeat EEG when the patient is awake my offer more information. ( As per Dr Modesto Charon)   Consultations:  Dr Roseanne Reno ( neurology)  Discharge Exam: Filed Vitals:  08/25/11 0508  BP: 133/82  Pulse: 76  Temp: 97.9 F (36.6 C)  Resp: 16   Filed Vitals:   08/24/11 0931 08/24/11 1330 08/24/11 2038 08/25/11 0508  BP: 126/85 118/82 134/82 133/82  Pulse: 90 79 83 76  Temp: 97.6 F (36.4 C) 98 F (36.7 C) 98.1 F (36.7 C) 97.9 F (36.6 C)  TempSrc: Oral Oral Oral Oral  Resp: 17 16 18 16   Height: 5\' 3"  (1.6 m)     Weight: 106.4 kg (234 lb 9.1 oz)     SpO2: 97% 98% 98%  100%    General: middle aged female in NAD Cardiovascular: Normal S1&S2, no murmurs Respiratory: clear b/l no added sounds Abd: soft NT, ND, bs+  cns: aaox3, NON FOCAL  Discharge Instructions   Medication List  As of 08/25/2011 11:04 AM   TAKE these medications         albuterol 108 (90 BASE) MCG/ACT inhaler   Commonly known as: PROVENTIL HFA;VENTOLIN HFA   Inhale 2 puffs into the lungs every 6 (six) hours as needed.      ferrous fumarate 325 (106 FE) MG Tabs   Commonly known as: HEMOCYTE - 106 mg FE   Take 1 tablet by mouth.      insulin glargine 100 UNIT/ML injection   Commonly known as: LANTUS   Inject 25 Units into the skin at bedtime.      lamoTRIgine 200 MG tablet   Commonly known as: LAMICTAL   Take 200 mg by mouth 2 (two) times daily. Pt has to have brand name per pt's pharmacy daw 1      levETIRAcetam 500 MG tablet   Commonly known as: KEPPRA   Take 1,000 mg by mouth every 12 (twelve) hours. Pt has to have brand per pt's pharmacy      loratadine 10 MG tablet   Commonly known as: CLARITIN   Take 10 mg by mouth daily.      metFORMIN 850 MG tablet   Commonly known as: GLUCOPHAGE   Take 1 tablet (850 mg total) by mouth 2 (two) times daily with a meal.      multivitamin with minerals Tabs   Take 1 tablet by mouth daily.      omeprazole 20 MG capsule   Commonly known as: PRILOSEC   Take 20 mg by mouth daily.      pioglitazone 15 MG tablet   Commonly known as: ACTOS   Take 1 tablet ( 15 mg)  by mouth twice daily.      vitamin C 100 MG tablet   Take 100 mg by mouth daily.      vitamin E 400 UNIT capsule   Take 400 Units by mouth daily.           Follow-up Information    Follow up with PANG,RICHARD, MD. Schedule an appointment as soon as possible for a visit in 1 week.   Contact information:   [address] [place] [address] [phone]       Follow up with Lesly Dukes, MD on 09/09/2011. (at 10:45 am)     Contact information:   [address], [address] [place] [address] [facility] [place] [address] [phone]           The results of significant diagnostics from this hospitalization (including imaging, microbiology, ancillary and laboratory) are listed below for reference.    Significant Diagnostic Studies: Ct Head Wo Contrast  08/24/2011  *  RADIOLOGY REPORT*  Clinical Data: Post seizures, history of MR and severe headache  CT HEAD WITHOUT CONTRAST  Technique:  Contiguous axial images were obtained from the base of the skull through the vertex without contrast.  Comparison: Head CT - 11/01/2010; 08/06/2007  Findings: Wallace Cullens white differentiation is maintained.  No CT evidence to large territory infarct.  No intraparenchymal or extra-axial mass or hemorrhage.  Normal sized configuration of ventricles and basilar cisterns.  No midline shift.  Minimal increase in polypoid mucosal thickening within the left maxillary sinus.  Remaining paranasal sinuses and mastoid air cells are normal.  Regional soft tissues are normal.  No displaced calvarial fracture.  IMPRESSION: 1.  Negative noncontrast head CT. 2.  Minimal increase in polypoid mucosal thickening within the left maxillary sinus.  Original Report Authenticated By: Waynard Reeds, M.D.    Microbiology: No results found for this or any previous visit (from the past 240 hour(s)).   Labs: Basic Metabolic Panel:  Lab 08/25/11 1610 08/24/11 0515  NA 136 131*  K 3.5 4.9  CL 101 94*  CO2 26 26  GLUCOSE 178* 363*  BUN 6 10  CREATININE 0.43* 0.40*  CALCIUM 8.9 9.1  MG -- --  PHOS -- --   Liver Function Tests:  Lab 08/25/11 0420  AST 16  ALT 15  ALKPHOS 59  BILITOT 0.5  PROT 6.6  ALBUMIN 3.1*   No results found for this basename: LIPASE:5,AMYLASE:5 in the last 168 hours No results found for this basename: AMMONIA:5 in the last 168 hours CBC:  Lab 08/25/11 0420 08/24/11 0515  WBC 3.7* 3.6*  NEUTROABS -- 1.7    HGB 12.5 13.1  HCT 37.6 38.6  MCV 81.7 80.6  PLT 184 170   Cardiac Enzymes: No results found for this basename: CKTOTAL:5,CKMB:5,CKMBINDEX:5,TROPONINI:5 in the last 168 hours BNP: BNP (last 3 results) No results found for this basename: PROBNP:3 in the last 8760 hours CBG:  Lab 08/25/11 0744 08/24/11 1149 08/24/11 0512  GLUCAP 142* 154* 357*    Time coordinating discharge: 25  minutes  Signed:  Kyleena Scheirer  [facility] 08/25/2011, 11:04 AM

## 2011-08-25 NOTE — Progress Notes (Signed)
Inpatient Diabetes Program Recommendations  AACE/ADA: New Consensus Statement on Inpatient Glycemic Control (2009)  Target Ranges:  Prepandial:   less than 140 mg/dL      Peak postprandial:   less than 180 mg/dL (1-2 hours)      Critically ill patients:  140 - 180 mg/dL   Reason for Visit: Elevated HgbA1C with hyperglycemia   46 year old [place] American female with past medical history of seizures, diabetes mellitus and mild developmental delay. Patient brought to the hospital because of generalized seizures. Patient couldn't tell what happened at home but apparently she did season she was brought by EMT to the hospital. In the emergency department she had another seizure which is started as partial and then generalized, witnessed by the ED physician and other ED staff.  Results for Tammy, Arellano (MRN [id number]) as of 08/25/2011 10:16  Ref. Range 08/24/2011 09:53  Hemoglobin A1C Latest Range: <5.7 % 12.3 (H)  Results for Tammy, Arellano (MRN [id number]) as of 08/25/2011 10:16  Ref. Range 08/24/2011 05:15 08/25/2011 04:20  Glucose Latest Range: 70-99 mg/dL 782 (H) 956 (H)  Results for Tammy, Arellano (MRN [id number]) as of 08/25/2011 10:16  Ref. Range 08/24/2011 05:12 08/24/2011 11:49 08/25/2011 07:44  Glucose-Capillary Latest Range: 70-99 mg/dL 469 (H) 629 (H) 528 (H)  On metformin and Actos prior to admission.  Recommendations:  Add Novolog sensitive tidwc. May need small amt of basal insulin along with OHAs at discharge. ? To consider Tradjenta 5 mg QD.  Will follow.

## 2011-08-25 NOTE — Care Management Note (Signed)
    Page 1 of 1   08/25/2011     11:34:32 AM   CARE MANAGEMENT NOTE 08/25/2011  Patient:  Tammy Arellano, Tammy Arellano   Account Number:  [id number]  Date Initiated:  08/25/2011  Documentation initiated by:  Lanier Clam  Subjective/Objective Assessment:   ADMITTED W/ELEVATED GLUCOSE LEVELS,SEIZURES.OZ:HYQM MENTAL RETARDATION.     Action/Plan:   FROM HOME W/FAMILY   Anticipated DC Date:  08/25/2011   Anticipated DC Plan:  HOME/SELF CARE      DC Planning Services  CM consult      Choice offered to / List presented to:             Status of service:  Completed, signed off Medicare Important Message given?   (If response is "NO", the following Medicare IM given date fields will be blank) Date Medicare IM given:   Date Additional Medicare IM given:    Discharge Disposition:  HOME/SELF CARE  Per UR Regulation:  Reviewed for med. necessity/level of care/duration of stay  If discussed at Long Length of Stay Meetings, dates discussed:    Comments:  08/25/11 Caiden Monsivais RN,BSN NCM [phone] NO D/C NEEDS.

## 2011-08-30 DIAGNOSIS — IMO0001 Reserved for inherently not codable concepts without codable children: Secondary | ICD-10-CM | POA: Diagnosis not present

## 2011-09-06 MED FILL — Ascorbic Acid Tab 250 MG: ORAL | Qty: 1 | Status: AC

## 2011-09-09 DIAGNOSIS — R569 Unspecified convulsions: Secondary | ICD-10-CM | POA: Diagnosis not present

## 2011-09-09 DIAGNOSIS — R55 Syncope and collapse: Secondary | ICD-10-CM | POA: Diagnosis not present

## 2011-09-09 DIAGNOSIS — G40309 Generalized idiopathic epilepsy and epileptic syndromes, not intractable, without status epilepticus: Secondary | ICD-10-CM | POA: Diagnosis not present

## 2011-10-21 DIAGNOSIS — Z23 Encounter for immunization: Secondary | ICD-10-CM | POA: Diagnosis not present

## 2011-10-21 DIAGNOSIS — IMO0001 Reserved for inherently not codable concepts without codable children: Secondary | ICD-10-CM | POA: Diagnosis not present

## 2011-10-21 DIAGNOSIS — R05 Cough: Secondary | ICD-10-CM | POA: Diagnosis not present

## 2011-10-21 DIAGNOSIS — R059 Cough, unspecified: Secondary | ICD-10-CM | POA: Diagnosis not present

## 2011-11-05 ENCOUNTER — Emergency Department (HOSPITAL_COMMUNITY)
Admission: EM | Admit: 2011-11-05 | Discharge: 2011-11-06 | Disposition: A | Discharge status: Home / Self Care | Payer: Medicare Other | Attending: Emergency Medicine | Admitting: Emergency Medicine

## 2011-11-05 ENCOUNTER — Encounter (HOSPITAL_COMMUNITY): Payer: Self-pay | Admitting: Emergency Medicine

## 2011-11-05 DIAGNOSIS — Z888 Allergy status to other drugs, medicaments and biological substances status: Secondary | ICD-10-CM | POA: Insufficient documentation

## 2011-11-05 DIAGNOSIS — G40909 Epilepsy, unspecified, not intractable, without status epilepticus: Secondary | ICD-10-CM | POA: Diagnosis not present

## 2011-11-05 DIAGNOSIS — Z9109 Other allergy status, other than to drugs and biological substances: Secondary | ICD-10-CM | POA: Insufficient documentation

## 2011-11-05 DIAGNOSIS — R569 Unspecified convulsions: Secondary | ICD-10-CM | POA: Diagnosis not present

## 2011-11-05 DIAGNOSIS — F79 Unspecified intellectual disabilities: Secondary | ICD-10-CM | POA: Diagnosis not present

## 2011-11-05 DIAGNOSIS — E119 Type 2 diabetes mellitus without complications: Secondary | ICD-10-CM | POA: Diagnosis not present

## 2011-11-05 DIAGNOSIS — Z8489 Family history of other specified conditions: Secondary | ICD-10-CM | POA: Insufficient documentation

## 2011-11-05 DIAGNOSIS — Z91013 Allergy to seafood: Secondary | ICD-10-CM | POA: Insufficient documentation

## 2011-11-05 DIAGNOSIS — Z833 Family history of diabetes mellitus: Secondary | ICD-10-CM | POA: Insufficient documentation

## 2011-11-05 DIAGNOSIS — Z91199 Patient's noncompliance with other medical treatment and regimen due to unspecified reason: Secondary | ICD-10-CM | POA: Insufficient documentation

## 2011-11-05 DIAGNOSIS — Z809 Family history of malignant neoplasm, unspecified: Secondary | ICD-10-CM | POA: Diagnosis not present

## 2011-11-05 DIAGNOSIS — Z9119 Patient's noncompliance with other medical treatment and regimen: Secondary | ICD-10-CM | POA: Insufficient documentation

## 2011-11-05 DIAGNOSIS — Z9114 Patient's other noncompliance with medication regimen: Secondary | ICD-10-CM

## 2011-11-05 DIAGNOSIS — Z880 Allergy status to penicillin: Secondary | ICD-10-CM | POA: Diagnosis not present

## 2011-11-05 NOTE — ED Notes (Signed)
Pt alert, arrives from home, c/o ? seizure activity. Mother states found pt in room, lying on floor, acting "groggy", pt ambulates to triage, steady gait noted, speech clear

## 2011-11-05 NOTE — ED Notes (Signed)
Pt remains in triage, alert, resp even unlabored, skin pwd, denies needs, cont to monitor

## 2011-11-05 NOTE — ED Provider Notes (Signed)
History     CSN: [id number]  Arrival date & time 11/05/11  2100   First MD Initiated Contact with Patient 11/05/11 2325      Chief Complaint  Patient presents with  . Seizures    (Consider location/radiation/quality/duration/timing/severity/associated sxs/prior treatment) HPI Comments: Ms. Vinje presents for evaluation from her.  Her mother found her in her room crawling on the floor confused and in a drunk-like state.  Her mental status has since improved significantly.  This presentation is typical for when she has seizures.  Ms. Milewski c/o feeling sore and states it feels as if she bit the inside of her right cheek and tongue.  She has had no recent medication changes, illnesses, fevers, or hospitalizations.  They brought her medications her to the emergency department and there are pills clearly labeled for the last 2.5 days, including the antiepileptic medications, that have not been taken.  Patient is a 46 y.o. female presenting with seizures. The history is provided by the patient and a parent. No language interpreter was used.  Seizures  This is a chronic problem. The current episode started 3 to 5 hours ago. The problem has been gradually improving. Number of times: unk, pt found by her mother. Characteristics include loss of consciousness and bit tongue. The episode was not witnessed. The seizures did not continue in the ED. Possible causes include missed seizure meds. Possible causes do not include med or dosage change, sleep deprivation, recent illness or change in alcohol use. There has been no fever.    Past Medical History  Diagnosis Date  . Seizures   . Diabetes mellitus   . Mental retardation   . Pseudoseizures     Past Surgical History  Procedure Date  . No past surgeries     Family History  Problem Relation Age of Onset  . Osteoarthritis Mother   . Diabetes Mother   . Cancer Father     History  Substance Use Topics  . Smoking status: Never Smoker     . Smokeless tobacco: Never Used  . Alcohol Use: No    OB History    Grav Para Term Preterm Abortions TAB SAB Ect Mult Living                  Review of Systems  Neurological: Positive for seizures and loss of consciousness.  All other systems reviewed and are negative.    Allergies  Dilantin; Lip balm; Penicillins; Furosemide; and Shellfish allergy  Home Medications   Current Outpatient Rx  Name Route Sig Dispense Refill  . ALBUTEROL SULFATE HFA 108 (90 BASE) MCG/ACT IN AERS Inhalation Inhale 2 puffs into the lungs every 6 (six) hours as needed.    Marland Kitchen VITAMIN C 100 MG PO TABS Oral Take 100 mg by mouth daily.    Marland Kitchen FERROUS FUMARATE 325 (106 FE) MG PO TABS Oral Take 1 tablet by mouth.    . INSULIN GLARGINE 100 UNIT/ML [place] SOLN Subcutaneous Inject 25 Units into the skin at bedtime. 10 mL 12  . LAMOTRIGINE 200 MG PO TABS Oral Take 200 mg by mouth 2 (two) times daily. Pt has to have brand name per pt's pharmacy daw 1    . LEVETIRACETAM 500 MG PO TABS Oral Take 1,000 mg by mouth every 12 (twelve) hours. Pt has to have brand per pt's pharmacy    . LORATADINE 10 MG PO TABS Oral Take 10 mg by mouth daily.    Marland Kitchen METFORMIN HCL 850 MG  PO TABS Oral Take 1 tablet (850 mg total) by mouth 2 (two) times daily with a meal. 60 tablet 0  . ADULT MULTIVITAMIN W/MINERALS CH Oral Take 1 tablet by mouth daily.    Marland Kitchen OMEPRAZOLE 20 MG PO CPDR Oral Take 20 mg by mouth daily.    Marland Kitchen PIOGLITAZONE HCL 30 MG PO TABS Oral Take 0.5 tablets (15 mg total) by mouth 2 (two) times daily at 10 AM and 5 PM. 30 tablet 0  . VITAMIN E 400 UNITS PO CAPS Oral Take 400 Units by mouth daily.      BP 137/89  Pulse 86  Temp 98 F (36.7 C)  Resp 16  SpO2 99%  Physical Exam  Nursing note and vitals reviewed. Constitutional: She is oriented to person, place, and time. She appears well-developed and well-nourished. No distress.  HENT:  Head: Normocephalic and atraumatic.  Right Ear: External ear normal.  Left Ear: External  ear normal.  Nose: Nose normal.  Mouth/Throat: Oropharynx is clear and moist. No oropharyngeal exudate.       + bite mark to right lateral tongue and adjacent cheek.  No laceration noted.  No bleeding.  No other dental/oral injuries noted.  Eyes: Conjunctivae normal and EOM are normal. Pupils are equal, round, and reactive to light. Right eye exhibits no discharge. Left eye exhibits no discharge. No scleral icterus.  Neck: Normal range of motion. Neck supple. No JVD present. No tracheal deviation present.  Cardiovascular: Normal rate, regular rhythm, normal heart sounds and intact distal pulses.  Exam reveals no gallop and no friction rub.   No murmur heard. Pulmonary/Chest: Effort normal and breath sounds normal. No stridor. No respiratory distress. She has no wheezes. She has no rales. She exhibits no tenderness.  Abdominal: Soft. Bowel sounds are normal. She exhibits no distension and no mass. There is no tenderness. There is no rebound and no guarding.  Musculoskeletal: Normal range of motion. She exhibits no edema and no tenderness.  Lymphadenopathy:    She has no cervical adenopathy.  Neurological: She is alert and oriented to person, place, and time. No cranial nerve deficit.  Skin: Skin is warm and dry. No rash noted. She is not diaphoretic. No erythema. No pallor.  Psychiatric: She has a normal mood and affect. Her behavior is normal.       Note a child-like affect.  Pt states that she has been taking her medications even though it is quite clear that she has missed multiple doses because of the way that her medications are packaged.    ED Course  Procedures (including critical care time)   Labs Reviewed  URINALYSIS, ROUTINE W REFLEX MICROSCOPIC   No results found.   No diagnosis found.    MDM  Pt presents for evaluation s/p a breakthrough seizure.  She is currently A&O, NAD.  Pt has medications clearly labeled in a blister packs that are to be taken in the morning,  afternoon, and at night.  She has not taken the medications from the 9th, 10th, and this morning.  Plan restart pt's medications with tonight's doses from her own medication packs.  She can restart her meds as usual tomorrow.  She has offered no explanation as to her noncompliance.  0045.  Pt stable, NAD.  Pt has taken evening doses of medications.  No seizure activity noted while under ER department care.        Tobin Chad, MD 11/06/11 [phone]

## 2011-11-06 LAB — URINALYSIS, ROUTINE W REFLEX MICROSCOPIC
Bilirubin Urine: NEGATIVE
Ketones, ur: NEGATIVE mg/dL
Nitrite: NEGATIVE
Specific Gravity, Urine: 1.018 (ref 1.005–1.030)
Urobilinogen, UA: 0.2 mg/dL (ref 0.0–1.0)

## 2011-11-06 NOTE — ED Notes (Signed)
Per Dr Lorenso Courier pt's pm dose of Keppra and Lamictal administered from pt's own meds and medications discarded from days pt's missed; ok to discard per patients mother.

## 2011-11-16 DIAGNOSIS — F339 Major depressive disorder, recurrent, unspecified: Secondary | ICD-10-CM | POA: Diagnosis not present

## 2011-11-18 DIAGNOSIS — Z9119 Patient's noncompliance with other medical treatment and regimen: Secondary | ICD-10-CM | POA: Diagnosis not present

## 2011-12-02 DIAGNOSIS — G40309 Generalized idiopathic epilepsy and epileptic syndromes, not intractable, without status epilepticus: Secondary | ICD-10-CM | POA: Diagnosis not present

## 2011-12-02 DIAGNOSIS — Z5181 Encounter for therapeutic drug level monitoring: Secondary | ICD-10-CM | POA: Diagnosis not present

## 2011-12-28 DIAGNOSIS — F70 Mild intellectual disabilities: Secondary | ICD-10-CM | POA: Diagnosis not present

## 2011-12-28 DIAGNOSIS — F39 Unspecified mood [affective] disorder: Secondary | ICD-10-CM | POA: Diagnosis not present

## 2011-12-31 ENCOUNTER — Encounter (HOSPITAL_COMMUNITY): Payer: Self-pay | Admitting: Adult Health

## 2011-12-31 DIAGNOSIS — G40909 Epilepsy, unspecified, not intractable, without status epilepticus: Secondary | ICD-10-CM | POA: Insufficient documentation

## 2011-12-31 DIAGNOSIS — F79 Unspecified intellectual disabilities: Secondary | ICD-10-CM | POA: Insufficient documentation

## 2011-12-31 DIAGNOSIS — Z3202 Encounter for pregnancy test, result negative: Secondary | ICD-10-CM | POA: Insufficient documentation

## 2011-12-31 DIAGNOSIS — Z794 Long term (current) use of insulin: Secondary | ICD-10-CM | POA: Diagnosis not present

## 2011-12-31 DIAGNOSIS — Z79899 Other long term (current) drug therapy: Secondary | ICD-10-CM | POA: Insufficient documentation

## 2011-12-31 DIAGNOSIS — E1169 Type 2 diabetes mellitus with other specified complication: Secondary | ICD-10-CM | POA: Insufficient documentation

## 2011-12-31 LAB — GLUCOSE, CAPILLARY: Glucose-Capillary: 34 mg/dL — CL (ref 70–99)

## 2011-12-31 MED ORDER — DEXTROSE 50 % IV SOLN
INTRAVENOUS | Status: AC
  Filled 2011-12-31: qty 50

## 2011-12-31 MED ORDER — GLUCOSE 40 % PO GEL
ORAL | Status: AC
  Filled 2011-12-31: qty 1

## 2011-12-31 MED ORDER — GLUCOSE 40 % PO GEL
1.0000 | Freq: Once | ORAL | Status: AC
  Administered 2011-12-31: 37.5 g via ORAL

## 2011-12-31 MED ORDER — DEXTROSE 50 % IV SOLN
1.0000 | Freq: Once | INTRAVENOUS | Status: AC
  Administered 2011-12-31: 50 mL via INTRAVENOUS

## 2011-12-31 NOTE — ED Notes (Signed)
cbg up to 129. After d50

## 2011-12-31 NOTE — ED Notes (Signed)
CBG checked 34.

## 2011-12-31 NOTE — ED Notes (Signed)
Presents to the ER with CBG 34,  Per mother pt has not been making sense this evening. Pt given glucose orally, milk, graham crackers and peanut butter and grape juice. Pt is alert and oriented. C/o being hot and nauseated that began at 2130. Recheck CBG 15 min after oral glucose and food, CBG 35. Given D50.

## 2012-01-01 ENCOUNTER — Emergency Department (HOSPITAL_COMMUNITY)
Admission: EM | Admit: 2012-01-01 | Discharge: 2012-01-01 | Disposition: A | Discharge status: Home / Self Care | Payer: Medicare Other | Attending: Emergency Medicine | Admitting: Emergency Medicine

## 2012-01-01 DIAGNOSIS — E162 Hypoglycemia, unspecified: Secondary | ICD-10-CM

## 2012-01-01 LAB — CBC WITH DIFFERENTIAL/PLATELET
Basophils Relative: 0 % (ref 0–1)
Hemoglobin: 12.3 g/dL (ref 12.0–15.0)
Lymphs Abs: 2.4 10*3/uL (ref 0.7–4.0)
MCHC: 32.5 g/dL (ref 30.0–36.0)
Monocytes Relative: 7 % (ref 3–12)
Neutro Abs: 5.2 10*3/uL (ref 1.7–7.7)
Neutrophils Relative %: 62 % (ref 43–77)
RBC: 4.61 MIL/uL (ref 3.87–5.11)

## 2012-01-01 LAB — POCT I-STAT, CHEM 8
BUN: 10 mg/dL (ref 6–23)
Chloride: 99 mEq/L (ref 96–112)
Creatinine, Ser: 0.6 mg/dL (ref 0.50–1.10)
Glucose, Bld: 138 mg/dL — ABNORMAL HIGH (ref 70–99)
Potassium: 3.7 mEq/L (ref 3.5–5.1)

## 2012-01-01 LAB — URINALYSIS, ROUTINE W REFLEX MICROSCOPIC
Bilirubin Urine: NEGATIVE
Glucose, UA: 500 mg/dL — AB
Hgb urine dipstick: NEGATIVE
Ketones, ur: NEGATIVE mg/dL
Protein, ur: NEGATIVE mg/dL

## 2012-01-01 LAB — COMPREHENSIVE METABOLIC PANEL
AST: 19 U/L (ref 0–37)
Albumin: 4 g/dL (ref 3.5–5.2)
Alkaline Phosphatase: 60 U/L (ref 39–117)
BUN: 12 mg/dL (ref 6–23)
Chloride: 97 mEq/L (ref 96–112)
Potassium: 2.9 mEq/L — ABNORMAL LOW (ref 3.5–5.1)
Total Bilirubin: 0.3 mg/dL (ref 0.3–1.2)

## 2012-01-01 NOTE — ED Notes (Addendum)
Patient said she started feeling real tired and sleepy.  Patient told her mom to bring her to the ED.  Patient is still extremely lethargic and is having a hard time staying awake.  We checked her blood sugar again and it was 201.  Patient's blood pressure is 126/64,  HR is 81, and O2 saturation is 100% on room air.

## 2012-01-01 NOTE — ED Notes (Signed)
Patient is resting comfortably. 

## 2012-01-01 NOTE — ED Notes (Signed)
Wallis Bamberg: [phone]

## 2012-01-01 NOTE — ED Notes (Signed)
Family at bedside. 

## 2012-01-01 NOTE — ED Notes (Signed)
Pt sleeping but arouses easily.  C/o being cold still

## 2012-01-02 NOTE — ED Provider Notes (Signed)
History     CSN: [id number]  Arrival date & time 12/31/11  2305   First MD Initiated Contact with Patient 01/01/12 0158      Chief Complaint  Patient presents with  . Hypoglycemia    (Consider location/radiation/quality/duration/timing/severity/associated sxs/prior treatment) HPI 46 yo female presents to the ER from home with her mother with complaint of "sweats" and confusion.  Pt found in triage to have blood sugar of 34.  Per mother and triage note (mother not available during my evaluation) pt has been confused this evening.  Pt was given oral glucose and pb, crackers, juice and sugar still low, given d50.  Pt reports having a salad around 6 pm, and then taking her actos, metformin and other nightime meds.  Pt then gave herself 25 units of one insulin and 15 of the other.  Pt cannot tell me which one, levimir or lantus she gave more of, per [facility] she is supposed to take 25 units of lantus.  Pt's main complaint is feeling hot and nauseated tonight.   Past Medical History  Diagnosis Date  . Seizures   . Diabetes mellitus   . Mental retardation   . Pseudoseizures     Past Surgical History  Procedure Date  . No past surgeries     Family History  Problem Relation Age of Onset  . Osteoarthritis Mother   . Diabetes Mother   . Cancer Father     History  Substance Use Topics  . Smoking status: Never Smoker   . Smokeless tobacco: Never Used  . Alcohol Use: No    OB History    Grav Para Term Preterm Abortions TAB SAB Ect Mult Living                  Review of Systems  Unable to perform ROS: Other  MR, poor historian  Allergies  Dilantin; Lip balm; Penicillins; Furosemide; and Shellfish allergy  Home Medications   Current Outpatient Rx  Name  Route  Sig  Dispense  Refill  . ALBUTEROL SULFATE HFA 108 (90 BASE) MCG/ACT IN AERS   Inhalation   Inhale 2 puffs into the lungs every 6 (six) hours as needed.         Marland Kitchen AMLODIPINE BESYLATE 10 MG PO TABS   Oral   Take  10 mg by mouth daily.         Marland Kitchen VITAMIN C 100 MG PO TABS   Oral   Take 100 mg by mouth daily.         Marland Kitchen FERROUS FUMARATE 325 (106 FE) MG PO TABS   Oral   Take 1 tablet by mouth.         . INSULIN ASPART 100 UNIT/ML [place] SOLN   Subcutaneous   Inject 15 Units into the skin 3 (three) times daily between meals as needed. For blood sugar         . INSULIN GLARGINE 100 UNIT/ML  SOLN   Subcutaneous   Inject 25 Units into the skin at bedtime.   10 mL   12   . LAMOTRIGINE 200 MG PO TABS   Oral   Take 200 mg by mouth 2 (two) times daily. Pt has to have brand name per pt's pharmacy daw 1         . LEVETIRACETAM 500 MG PO TABS   Oral   Take 2,000 mg by mouth 2 (two) times daily. 500mg  in the morning. 2000mg  at night.         Marland Kitchen  LORATADINE 10 MG PO TABS   Oral   Take 10 mg by mouth daily.         Marland Kitchen MONTELUKAST SODIUM 10 MG PO TABS   Oral   Take 10 mg by mouth at bedtime.         . ADULT MULTIVITAMIN W/MINERALS CH   Oral   Take 1 tablet by mouth daily.         Marland Kitchen OMEPRAZOLE 20 MG PO CPDR   Oral   Take 20 mg by mouth daily.         Marland Kitchen PIOGLITAZONE HCL-METFORMIN HCL 15-850 MG PO TABS   Oral   Take 1 tablet by mouth daily.         Marland Kitchen VITAMIN E 400 UNITS PO CAPS   Oral   Take 400 Units by mouth daily.           BP 102/51  Pulse 61  Resp 18  SpO2 100%  Physical Exam  Nursing note and vitals reviewed. Constitutional: She is oriented to person, place, and time. She appears well-developed and well-nourished. No distress.  HENT:  Head: Normocephalic and atraumatic.  Right Ear: External ear normal.  Left Ear: External ear normal.  Nose: Nose normal.  Mouth/Throat: Oropharynx is clear and moist.  Eyes: Conjunctivae normal and EOM are normal. Pupils are equal, round, and reactive to light.  Neck: Normal range of motion. Neck supple. No JVD present. No tracheal deviation present. No thyromegaly present.  Cardiovascular: Normal rate, regular rhythm,  normal heart sounds and intact distal pulses.  Exam reveals no gallop and no friction rub.   No murmur heard. Pulmonary/Chest: Effort normal and breath sounds normal. No stridor. No respiratory distress. She has no wheezes. She has no rales. She exhibits no tenderness.  Abdominal: Soft. Bowel sounds are normal. She exhibits no distension and no mass. There is no tenderness. There is no rebound and no guarding.  Musculoskeletal: Normal range of motion. She exhibits no edema and no tenderness.  Lymphadenopathy:    She has no cervical adenopathy.  Neurological: She is alert and oriented to person, place, and time. She has normal reflexes. No cranial nerve deficit. She exhibits normal muscle tone. Coordination normal.  Skin: Skin is dry. No rash noted. No erythema. No pallor.  Psychiatric: She has a normal mood and affect. Her behavior is normal. Judgment and thought content normal.    ED Course  Procedures (including critical care time)  Labs Reviewed  GLUCOSE, CAPILLARY - Abnormal; Notable for the following:    Glucose-Capillary 34 (*)     All other components within normal limits  COMPREHENSIVE METABOLIC PANEL - Abnormal; Notable for the following:    Potassium 2.9 (*)     Glucose, Bld 60 (*)     Creatinine, Ser 0.45 (*)     All other components within normal limits  URINALYSIS, ROUTINE W REFLEX MICROSCOPIC - Abnormal; Notable for the following:    APPearance CLOUDY (*)     Specific Gravity, Urine 1.034 (*)     Glucose, UA 500 (*)     All other components within normal limits  GLUCOSE, CAPILLARY - Abnormal; Notable for the following:    Glucose-Capillary 201 (*)     All other components within normal limits  POCT I-STAT, CHEM 8 - Abnormal; Notable for the following:    Glucose, Bld 138 (*)     All other components within normal limits  GLUCOSE, CAPILLARY - Abnormal; Notable for the following:  Glucose-Capillary 149 (*)     All other components within normal limits  CBC WITH  DIFFERENTIAL  POCT PREGNANCY, URINE  LAB REPORT - SCANNED   No results found.   1. Hypoglycemia       MDM  46 yo female with hypoglycemia.  Suspect pt may have mixed up lantus and novolog.  Pt has responded to d50 and has remained stable.  WIll d/c home to f/u with pcm.        Olivia Mackie, MD 01/02/12 1025

## 2012-01-03 LAB — GLUCOSE, CAPILLARY

## 2012-01-05 DIAGNOSIS — Z9119 Patient's noncompliance with other medical treatment and regimen: Secondary | ICD-10-CM | POA: Diagnosis not present

## 2012-01-05 DIAGNOSIS — E1065 Type 1 diabetes mellitus with hyperglycemia: Secondary | ICD-10-CM | POA: Diagnosis not present

## 2012-01-10 DIAGNOSIS — IMO0001 Reserved for inherently not codable concepts without codable children: Secondary | ICD-10-CM | POA: Diagnosis not present

## 2012-01-10 DIAGNOSIS — J329 Chronic sinusitis, unspecified: Secondary | ICD-10-CM | POA: Diagnosis not present

## 2012-01-10 DIAGNOSIS — E669 Obesity, unspecified: Secondary | ICD-10-CM | POA: Diagnosis not present

## 2012-01-13 DIAGNOSIS — R05 Cough: Secondary | ICD-10-CM | POA: Diagnosis not present

## 2012-01-13 DIAGNOSIS — J45901 Unspecified asthma with (acute) exacerbation: Secondary | ICD-10-CM | POA: Diagnosis not present

## 2012-01-13 DIAGNOSIS — IMO0001 Reserved for inherently not codable concepts without codable children: Secondary | ICD-10-CM | POA: Diagnosis not present

## 2012-01-13 DIAGNOSIS — R059 Cough, unspecified: Secondary | ICD-10-CM | POA: Diagnosis not present

## 2012-01-29 DIAGNOSIS — F339 Major depressive disorder, recurrent, unspecified: Secondary | ICD-10-CM | POA: Diagnosis not present

## 2012-02-01 DIAGNOSIS — J45901 Unspecified asthma with (acute) exacerbation: Secondary | ICD-10-CM | POA: Diagnosis not present

## 2012-02-01 DIAGNOSIS — J984 Other disorders of lung: Secondary | ICD-10-CM | POA: Diagnosis not present

## 2012-02-01 DIAGNOSIS — R509 Fever, unspecified: Secondary | ICD-10-CM | POA: Diagnosis not present

## 2012-02-01 DIAGNOSIS — J9801 Acute bronchospasm: Secondary | ICD-10-CM | POA: Diagnosis not present

## 2012-02-01 DIAGNOSIS — J45909 Unspecified asthma, uncomplicated: Secondary | ICD-10-CM | POA: Diagnosis not present

## 2012-02-24 DIAGNOSIS — IMO0001 Reserved for inherently not codable concepts without codable children: Secondary | ICD-10-CM | POA: Diagnosis not present

## 2012-02-29 DIAGNOSIS — F70 Mild intellectual disabilities: Secondary | ICD-10-CM | POA: Diagnosis not present

## 2012-02-29 DIAGNOSIS — F39 Unspecified mood [affective] disorder: Secondary | ICD-10-CM | POA: Diagnosis not present

## 2012-03-02 DIAGNOSIS — IMO0001 Reserved for inherently not codable concepts without codable children: Secondary | ICD-10-CM | POA: Diagnosis not present

## 2012-03-02 DIAGNOSIS — E663 Overweight: Secondary | ICD-10-CM | POA: Diagnosis not present

## 2012-03-02 DIAGNOSIS — J45909 Unspecified asthma, uncomplicated: Secondary | ICD-10-CM | POA: Diagnosis not present

## 2012-03-02 DIAGNOSIS — Z23 Encounter for immunization: Secondary | ICD-10-CM | POA: Diagnosis not present

## 2012-03-06 DIAGNOSIS — F70 Mild intellectual disabilities: Secondary | ICD-10-CM | POA: Diagnosis not present

## 2012-03-06 DIAGNOSIS — F39 Unspecified mood [affective] disorder: Secondary | ICD-10-CM | POA: Diagnosis not present

## 2012-04-17 DIAGNOSIS — J45901 Unspecified asthma with (acute) exacerbation: Secondary | ICD-10-CM | POA: Diagnosis not present

## 2012-04-17 DIAGNOSIS — J40 Bronchitis, not specified as acute or chronic: Secondary | ICD-10-CM | POA: Diagnosis not present

## 2012-05-04 DIAGNOSIS — F39 Unspecified mood [affective] disorder: Secondary | ICD-10-CM | POA: Diagnosis not present

## 2012-05-04 DIAGNOSIS — F70 Mild intellectual disabilities: Secondary | ICD-10-CM | POA: Diagnosis not present

## 2012-06-01 DIAGNOSIS — IMO0001 Reserved for inherently not codable concepts without codable children: Secondary | ICD-10-CM | POA: Diagnosis not present

## 2012-06-01 DIAGNOSIS — Z9119 Patient's noncompliance with other medical treatment and regimen: Secondary | ICD-10-CM | POA: Diagnosis not present

## 2012-06-18 ENCOUNTER — Encounter (HOSPITAL_COMMUNITY): Payer: Self-pay | Admitting: Emergency Medicine

## 2012-06-18 ENCOUNTER — Emergency Department (HOSPITAL_COMMUNITY)
Admission: EM | Admit: 2012-06-18 | Discharge: 2012-06-18 | Disposition: A | Discharge status: Home / Self Care | Payer: Medicare Other | Attending: Emergency Medicine | Admitting: Emergency Medicine

## 2012-06-18 DIAGNOSIS — G40909 Epilepsy, unspecified, not intractable, without status epilepticus: Secondary | ICD-10-CM | POA: Diagnosis not present

## 2012-06-18 DIAGNOSIS — Z79899 Other long term (current) drug therapy: Secondary | ICD-10-CM | POA: Insufficient documentation

## 2012-06-18 DIAGNOSIS — R569 Unspecified convulsions: Secondary | ICD-10-CM | POA: Diagnosis not present

## 2012-06-18 DIAGNOSIS — F79 Unspecified intellectual disabilities: Secondary | ICD-10-CM | POA: Diagnosis not present

## 2012-06-18 DIAGNOSIS — R0989 Other specified symptoms and signs involving the circulatory and respiratory systems: Secondary | ICD-10-CM | POA: Diagnosis not present

## 2012-06-18 DIAGNOSIS — Z794 Long term (current) use of insulin: Secondary | ICD-10-CM | POA: Insufficient documentation

## 2012-06-18 DIAGNOSIS — E1169 Type 2 diabetes mellitus with other specified complication: Secondary | ICD-10-CM | POA: Insufficient documentation

## 2012-06-18 DIAGNOSIS — E162 Hypoglycemia, unspecified: Secondary | ICD-10-CM

## 2012-06-18 DIAGNOSIS — Z880 Allergy status to penicillin: Secondary | ICD-10-CM | POA: Diagnosis not present

## 2012-06-18 LAB — COMPREHENSIVE METABOLIC PANEL
AST: 34 U/L (ref 0–37)
Albumin: 3.3 g/dL — ABNORMAL LOW (ref 3.5–5.2)
Chloride: 100 mEq/L (ref 96–112)
Creatinine, Ser: 0.35 mg/dL — ABNORMAL LOW (ref 0.50–1.10)
Potassium: 5.6 mEq/L — ABNORMAL HIGH (ref 3.5–5.1)
Total Bilirubin: 0.2 mg/dL — ABNORMAL LOW (ref 0.3–1.2)
Total Protein: 7.8 g/dL (ref 6.0–8.3)

## 2012-06-18 LAB — CBC WITH DIFFERENTIAL/PLATELET
Basophils Absolute: 0 10*3/uL (ref 0.0–0.1)
Basophils Relative: 0 % (ref 0–1)
Eosinophils Absolute: 0 10*3/uL (ref 0.0–0.7)
MCH: 26.7 pg (ref 26.0–34.0)
MCHC: 33.1 g/dL (ref 30.0–36.0)
Monocytes Absolute: 0.2 10*3/uL (ref 0.1–1.0)
Neutro Abs: 3.8 10*3/uL (ref 1.7–7.7)
Neutrophils Relative %: 79 % — ABNORMAL HIGH (ref 43–77)
RDW: 15.1 % (ref 11.5–15.5)

## 2012-06-18 LAB — GLUCOSE, CAPILLARY: Glucose-Capillary: 81 mg/dL (ref 70–99)

## 2012-06-18 MED ORDER — SODIUM CHLORIDE 0.9 % IV SOLN
500.0000 mg | Freq: Once | INTRAVENOUS | Status: AC
  Administered 2012-06-18: 500 mg via INTRAVENOUS
  Filled 2012-06-18: qty 5

## 2012-06-18 NOTE — ED Provider Notes (Signed)
History     CSN: [id number]  Arrival date & time 06/18/12  0708   First MD Initiated Contact with Patient 06/18/12 0730      Chief Complaint  Patient presents with  . Seizures    (Consider location/radiation/quality/duration/timing/severity/associated sxs/prior treatment) Patient is a 47 y.o. female presenting with seizures. The history is provided by the patient (the pt took her insulin early this am and then did not ear her breakfast.  she had a sz and her glucose was 33).  Seizures Seizure activity on arrival: no   Seizure type:  Grand mal Preceding symptoms: no sensation of an aura present   Initial focality:  None Episode characteristics: disorientation     Past Medical History  Diagnosis Date  . Seizures   . Diabetes mellitus   . Mental retardation   . Pseudoseizures     Past Surgical History  Procedure Laterality Date  . No past surgeries      Family History  Problem Relation Age of Onset  . Osteoarthritis Mother   . Diabetes Mother   . Cancer Father     History  Substance Use Topics  . Smoking status: Never Smoker   . Smokeless tobacco: Never Used  . Alcohol Use: No    OB History   Grav Para Term Preterm Abortions TAB SAB Ect Mult Living                  Review of Systems  Constitutional: Negative for appetite change and fatigue.  HENT: Negative for congestion, sinus pressure and ear discharge.   Eyes: Negative for discharge.  Respiratory: Negative for cough.   Cardiovascular: Negative for chest pain.  Gastrointestinal: Negative for abdominal pain and diarrhea.  Genitourinary: Negative for frequency and hematuria.  Musculoskeletal: Negative for back pain.  Skin: Negative for rash.  Neurological: Positive for seizures. Negative for headaches.  Psychiatric/Behavioral: Negative for hallucinations.    Allergies  Dilantin; Lip balm; Penicillins; Furosemide; and Shellfish allergy  Home Medications   Current Outpatient Rx  Name  Route  Sig   Dispense  Refill  . albuterol (PROVENTIL HFA;VENTOLIN HFA) 108 (90 BASE) MCG/ACT inhaler   Inhalation   Inhale 2 puffs into the lungs every 6 (six) hours as needed.         Marland Kitchen amLODipine (NORVASC) 10 MG tablet   Oral   Take 10 mg by mouth daily.         . Ascorbic Acid (VITAMIN C) 100 MG tablet   Oral   Take 100 mg by mouth daily.         . ferrous fumarate (HEMOCYTE - 106 MG FE) 325 (106 FE) MG TABS   Oral   Take 1 tablet by mouth.         . insulin aspart (NOVOLOG) 100 UNIT/ML injection   Subcutaneous   Inject 15 Units into the skin 3 (three) times daily between meals as needed. For blood sugar         . insulin glargine (LANTUS) 100 UNIT/ML injection   Subcutaneous   Inject 25 Units into the skin at bedtime.   10 mL   12   . lamoTRIgine (LAMICTAL) 200 MG tablet   Oral   Take 200 mg by mouth 2 (two) times daily. Pt has to have brand name per pt's pharmacy daw 1         . levETIRAcetam (KEPPRA) 500 MG tablet   Oral   Take 2,000 mg by mouth 2 (  two) times daily. 500mg  in the morning. 2000mg  at night.         . loratadine (CLARITIN) 10 MG tablet   Oral   Take 10 mg by mouth daily.         . montelukast (SINGULAIR) 10 MG tablet   Oral   Take 10 mg by mouth at bedtime.         . Multiple Vitamin (MULTIVITAMIN WITH MINERALS) TABS   Oral   Take 1 tablet by mouth daily.         Marland Kitchen omeprazole (PRILOSEC) 20 MG capsule   Oral   Take 20 mg by mouth daily.         . pioglitazone-metformin (ACTOPLUS MET) 15-850 MG per tablet   Oral   Take 1 tablet by mouth daily.         . vitamin E 400 UNIT capsule   Oral   Take 400 Units by mouth daily.           BP 134/71  Pulse 91  Temp(Src) 97.3 F (36.3 C) (Oral)  Resp 22  SpO2 97%  Physical Exam  Constitutional: She is oriented to person, place, and time. She appears well-developed.  HENT:  Head: Normocephalic.  Eyes: Conjunctivae and EOM are normal. No scleral icterus.  Neck: Neck supple. No  thyromegaly present.  Cardiovascular: Normal rate and regular rhythm.  Exam reveals no gallop and no friction rub.   No murmur heard. Pulmonary/Chest: No stridor. She has no wheezes. She has no rales. She exhibits no tenderness.  Abdominal: She exhibits no distension. There is no tenderness. There is no rebound.  Musculoskeletal: Normal range of motion. She exhibits no edema.  Lymphadenopathy:    She has no cervical adenopathy.  Neurological: She is oriented to person, place, and time. Coordination normal.  Skin: No rash noted. No erythema.  Psychiatric: She has a normal mood and affect. Her behavior is normal.    ED Course  Procedures (including critical care time)  Labs Reviewed  CBC WITH DIFFERENTIAL - Abnormal; Notable for the following:    Neutrophils Relative % 79 (*)    All other components within normal limits  COMPREHENSIVE METABOLIC PANEL - Abnormal; Notable for the following:    Glucose, Bld 228 (*)    Creatinine, Ser 0.35 (*)    Albumin 3.3 (*)    Total Bilirubin 0.2 (*)    All other components within normal limits  GLUCOSE, CAPILLARY   No results found.   1. Hypoglycemia   2. Seizure       MDM  sz from hypoglycemia        Benny Lennert, MD 06/18/12 1051

## 2012-06-18 NOTE — ED Notes (Signed)
Unable to get labs from pt after multiple attempts by RN and charge nurse, hospital phlebotomy called.

## 2012-06-18 NOTE — ED Notes (Signed)
Per EMS: witnessed seizure that lasted for about 2 min, has not taken medication this morning. Initial CBG was 33.

## 2012-06-18 NOTE — ED Notes (Signed)
ZOX:[id number]<AV> Expected date:<BR> Expected time:<BR> Means of arrival:<BR> Comments:<BR> EMS/seizure

## 2012-06-19 ENCOUNTER — Emergency Department (HOSPITAL_COMMUNITY): Payer: Medicare Other

## 2012-06-19 ENCOUNTER — Encounter (HOSPITAL_COMMUNITY): Payer: Self-pay | Admitting: *Deleted

## 2012-06-19 ENCOUNTER — Observation Stay (HOSPITAL_COMMUNITY)
Admission: EM | Admit: 2012-06-19 | Discharge: 2012-06-20 | Disposition: A | Discharge status: Home / Self Care | Payer: Medicare Other | Attending: Internal Medicine | Admitting: Internal Medicine

## 2012-06-19 DIAGNOSIS — E1169 Type 2 diabetes mellitus with other specified complication: Secondary | ICD-10-CM | POA: Diagnosis not present

## 2012-06-19 DIAGNOSIS — E1165 Type 2 diabetes mellitus with hyperglycemia: Secondary | ICD-10-CM | POA: Diagnosis present

## 2012-06-19 DIAGNOSIS — F79 Unspecified intellectual disabilities: Secondary | ICD-10-CM | POA: Diagnosis not present

## 2012-06-19 DIAGNOSIS — E162 Hypoglycemia, unspecified: Secondary | ICD-10-CM | POA: Diagnosis not present

## 2012-06-19 DIAGNOSIS — Z79899 Other long term (current) drug therapy: Secondary | ICD-10-CM | POA: Insufficient documentation

## 2012-06-19 DIAGNOSIS — E119 Type 2 diabetes mellitus without complications: Secondary | ICD-10-CM | POA: Diagnosis not present

## 2012-06-19 DIAGNOSIS — F445 Conversion disorder with seizures or convulsions: Secondary | ICD-10-CM

## 2012-06-19 DIAGNOSIS — R569 Unspecified convulsions: Secondary | ICD-10-CM | POA: Diagnosis present

## 2012-06-19 DIAGNOSIS — E669 Obesity, unspecified: Secondary | ICD-10-CM | POA: Insufficient documentation

## 2012-06-19 DIAGNOSIS — R0989 Other specified symptoms and signs involving the circulatory and respiratory systems: Secondary | ICD-10-CM | POA: Diagnosis not present

## 2012-06-19 DIAGNOSIS — Z794 Long term (current) use of insulin: Secondary | ICD-10-CM | POA: Diagnosis not present

## 2012-06-19 DIAGNOSIS — G40909 Epilepsy, unspecified, not intractable, without status epilepticus: Secondary | ICD-10-CM

## 2012-06-19 DIAGNOSIS — E161 Other hypoglycemia: Secondary | ICD-10-CM

## 2012-06-19 DIAGNOSIS — IMO0002 Reserved for concepts with insufficient information to code with codable children: Principal | ICD-10-CM

## 2012-06-19 DIAGNOSIS — I10 Essential (primary) hypertension: Secondary | ICD-10-CM | POA: Insufficient documentation

## 2012-06-19 DIAGNOSIS — G40409 Other generalized epilepsy and epileptic syndromes, not intractable, without status epilepticus: Secondary | ICD-10-CM

## 2012-06-19 LAB — CBC
HCT: 35.7 % — ABNORMAL LOW (ref 36.0–46.0)
Hemoglobin: 11.7 g/dL — ABNORMAL LOW (ref 12.0–15.0)
MCHC: 32.8 g/dL (ref 30.0–36.0)
RBC: 4.41 MIL/uL (ref 3.87–5.11)

## 2012-06-19 LAB — COMPREHENSIVE METABOLIC PANEL
ALT: 16 U/L (ref 0–35)
Albumin: 3.3 g/dL — ABNORMAL LOW (ref 3.5–5.2)
BUN: 14 mg/dL (ref 6–23)
Chloride: 101 mEq/L (ref 96–112)
Creatinine, Ser: 0.63 mg/dL (ref 0.50–1.10)
GFR calc non Af Amer: >90 mL/min (ref >90)
Total Bilirubin: 0.2 mg/dL — ABNORMAL LOW (ref 0.3–1.2)

## 2012-06-19 LAB — URINALYSIS, ROUTINE W REFLEX MICROSCOPIC
Glucose, UA: NEGATIVE mg/dL
Leukocytes, UA: NEGATIVE
Nitrite: NEGATIVE
Protein, ur: NEGATIVE mg/dL
Urobilinogen, UA: 0.2 mg/dL (ref 0.0–1.0)

## 2012-06-19 LAB — POCT I-STAT, CHEM 8
Calcium, Ion: 1.2 mmol/L (ref 1.12–1.23)
HCT: 38 % (ref 36.0–46.0)
Hemoglobin: 12.9 g/dL (ref 12.0–15.0)
TCO2: 29 mmol/L (ref 0–100)

## 2012-06-19 LAB — GLUCOSE, CAPILLARY
Glucose-Capillary: 189 mg/dL — ABNORMAL HIGH (ref 70–99)
Glucose-Capillary: 25 mg/dL — CL (ref 70–99)
Glucose-Capillary: 250 mg/dL — ABNORMAL HIGH (ref 70–99)
Glucose-Capillary: 54 mg/dL — ABNORMAL LOW (ref 70–99)
Glucose-Capillary: 99 mg/dL (ref 70–99)

## 2012-06-19 MED ORDER — DEXTROSE 5 % IV SOLN
Freq: Once | INTRAVENOUS | Status: AC
  Administered 2012-06-19: 01:00:00 via INTRAVENOUS

## 2012-06-19 MED ORDER — LORATADINE 10 MG PO TABS
10.0000 mg | ORAL_TABLET | Freq: Every day | ORAL | Status: DC
  Administered 2012-06-19 – 2012-06-20 (×2): 10 mg via ORAL
  Filled 2012-06-19 (×2): qty 1

## 2012-06-19 MED ORDER — DEXTROSE 5 % IV SOLN
INTRAVENOUS | Status: DC
  Administered 2012-06-19: 04:00:00 via INTRAVENOUS

## 2012-06-19 MED ORDER — ALBUTEROL SULFATE HFA 108 (90 BASE) MCG/ACT IN AERS: 2.0000 | INHALATION_SPRAY | RESPIRATORY_TRACT | Status: DC | PRN

## 2012-06-19 MED ORDER — PANTOPRAZOLE SODIUM 40 MG PO TBEC
40.0000 mg | DELAYED_RELEASE_TABLET | Freq: Every day | ORAL | Status: DC
  Administered 2012-06-19 – 2012-06-20 (×2): 40 mg via ORAL
  Filled 2012-06-19: qty 1

## 2012-06-19 MED ORDER — INSULIN ASPART 100 UNIT/ML ~~LOC~~ SOLN: 5.0000 [IU] | Freq: Every evening | SUBCUTANEOUS | Status: DC

## 2012-06-19 MED ORDER — FERROUS FUMARATE 325 (106 FE) MG PO TABS
1.0000 | ORAL_TABLET | Freq: Every day | ORAL | Status: DC
  Administered 2012-06-19 – 2012-06-20 (×2): 106 mg via ORAL
  Filled 2012-06-19 (×2): qty 1

## 2012-06-19 MED ORDER — INSULIN ASPART 100 UNIT/ML ~~LOC~~ SOLN: 10.0000 [IU] | Freq: Every day | SUBCUTANEOUS | Status: DC

## 2012-06-19 MED ORDER — INSULIN ASPART 100 UNIT/ML ~~LOC~~ SOLN
0.0000 [IU] | SUBCUTANEOUS | Status: DC
  Administered 2012-06-19: 3 [IU] via SUBCUTANEOUS
  Administered 2012-06-19 (×2): 2 [IU] via SUBCUTANEOUS

## 2012-06-19 MED ORDER — DEXTROSE 50 % IV SOLN
INTRAVENOUS | Status: AC
  Administered 2012-06-19: 50 mL via INTRAVENOUS
  Filled 2012-06-19: qty 50

## 2012-06-19 MED ORDER — LEVETIRACETAM 750 MG PO TABS
2000.0000 mg | ORAL_TABLET | Freq: Two times a day (BID) | ORAL | Status: DC
  Administered 2012-06-19 – 2012-06-20 (×3): 2000 mg via ORAL
  Filled 2012-06-19 (×4): qty 1

## 2012-06-19 MED ORDER — ADULT MULTIVITAMIN W/MINERALS CH
1.0000 | ORAL_TABLET | Freq: Every day | ORAL | Status: DC
  Administered 2012-06-19 – 2012-06-20 (×2): 1 via ORAL
  Filled 2012-06-19 (×2): qty 1

## 2012-06-19 MED ORDER — AMLODIPINE BESYLATE 10 MG PO TABS
10.0000 mg | ORAL_TABLET | Freq: Every day | ORAL | Status: DC
  Administered 2012-06-19 – 2012-06-20 (×2): 10 mg via ORAL
  Filled 2012-06-19 (×2): qty 1

## 2012-06-19 MED ORDER — LAMOTRIGINE 200 MG PO TABS
200.0000 mg | ORAL_TABLET | Freq: Two times a day (BID) | ORAL | Status: DC
  Administered 2012-06-19 – 2012-06-20 (×3): 200 mg via ORAL
  Filled 2012-06-19 (×6): qty 1

## 2012-06-19 MED ORDER — INSULIN DETEMIR 100 UNIT/ML ~~LOC~~ SOLN
10.0000 [IU] | Freq: Every day | SUBCUTANEOUS | Status: DC
  Filled 2012-06-19: qty 0.1

## 2012-06-19 MED ORDER — DEXTROSE 50 % IV SOLN
50.0000 mL | Freq: Once | INTRAVENOUS | Status: AC
  Administered 2012-06-19: 50 mL via INTRAVENOUS

## 2012-06-19 MED ORDER — INSULIN DETEMIR 100 UNIT/ML ~~LOC~~ SOLN
20.0000 [IU] | Freq: Every day | SUBCUTANEOUS | Status: DC
  Administered 2012-06-19: 20 [IU] via SUBCUTANEOUS
  Filled 2012-06-19 (×2): qty 0.2

## 2012-06-19 MED ORDER — MONTELUKAST SODIUM 10 MG PO TABS
10.0000 mg | ORAL_TABLET | Freq: Every day | ORAL | Status: DC
  Administered 2012-06-19: 10 mg via ORAL
  Filled 2012-06-19 (×2): qty 1

## 2012-06-19 MED ORDER — INSULIN ASPART 100 UNIT/ML ~~LOC~~ SOLN: 15.0000 [IU] | Freq: Every day | SUBCUTANEOUS | Status: DC

## 2012-06-19 NOTE — ED Notes (Signed)
Patient drink 2 ( ) containers of Orange juice and 1 container of milk. Patient consumed 2 Saltine Crackers with peanut butter and 1 [place] sandwich.

## 2012-06-19 NOTE — ED Provider Notes (Signed)
History     CSN: [id number]  Arrival date & time 06/19/12  0019   First MD Initiated Contact with Patient 06/19/12 [id number]      Chief Complaint  Patient presents with  . Hypoglycemia    (Consider location/radiation/quality/duration/timing/severity/associated sxs/prior treatment) HPI Hx per PT and her mother, h/o seizures on Keprra and IDDM recently;y had increase in insulin due to elevated blood sugars, was evaluated at [facility] hospital this am for low blood sugar after taking prescribed insulin. She was having confusion/ AMS and shaking activity. Her condition improved and she was discharged home, at 9pm tonight again took her prescribed insulin and developed blood sugar 22 with shaking confusion and more Sz activity persistent in the ED. Her mother fed her sugars and symptoms persisting. Mod in severity, shaking os not similar to her typical seizures.  Past Medical History  Diagnosis Date  . Seizures   . Diabetes mellitus   . Mental retardation   . Pseudoseizures     Past Surgical History  Procedure Laterality Date  . No past surgeries      Family History  Problem Relation Age of Onset  . Osteoarthritis Mother   . Diabetes Mother   . Cancer Father     History  Substance Use Topics  . Smoking status: Never Smoker   . Smokeless tobacco: Never Used  . Alcohol Use: No    OB History   Grav Para Term Preterm Abortions TAB SAB Ect Mult Living                  Review of Systems  Constitutional: Negative for fever and chills.  HENT: Negative for neck pain and neck stiffness.   Eyes: Negative for pain.  Respiratory: Negative for shortness of breath.   Cardiovascular: Negative for chest pain.  Gastrointestinal: Negative for vomiting and abdominal pain.  Genitourinary: Negative for dysuria.  Musculoskeletal: Negative for back pain.  Skin: Negative for rash.  Neurological: Positive for seizures and speech difficulty. Negative for headaches.  All other systems reviewed and  are negative.    Allergies  Dilantin; Lip balm; Penicillins; Furosemide; and Shellfish allergy  Home Medications   Current Outpatient Rx  Name  Route  Sig  Dispense  Refill  . albuterol (PROVENTIL HFA;VENTOLIN HFA) 108 (90 BASE) MCG/ACT inhaler   Inhalation   Inhale 2 puffs into the lungs every 6 (six) hours as needed.         Marland Kitchen amLODipine (NORVASC) 10 MG tablet   Oral   Take 10 mg by mouth daily.         . Ascorbic Acid (VITAMIN C) 100 MG tablet   Oral   Take 100 mg by mouth daily.         . ferrous fumarate (HEMOCYTE - 106 MG FE) 325 (106 FE) MG TABS   Oral   Take 1 tablet by mouth.         . insulin aspart (NOVOLOG) 100 UNIT/ML injection   Subcutaneous   Inject 15 Units into the skin 3 (three) times daily between meals as needed. For blood sugar         . insulin glargine (LANTUS) 100 UNIT/ML injection   Subcutaneous   Inject 25 Units into the skin at bedtime.   10 mL   12   . lamoTRIgine (LAMICTAL) 200 MG tablet   Oral   Take 200 mg by mouth 2 (two) times daily. Pt has to have brand name per pt's pharmacy daw  1         . levETIRAcetam (KEPPRA) 500 MG tablet   Oral   Take 2,000 mg by mouth 2 (two) times daily. 500mg  in the morning. 2000mg  at night.         . loratadine (CLARITIN) 10 MG tablet   Oral   Take 10 mg by mouth daily.         . montelukast (SINGULAIR) 10 MG tablet   Oral   Take 10 mg by mouth at bedtime.         . Multiple Vitamin (MULTIVITAMIN WITH MINERALS) TABS   Oral   Take 1 tablet by mouth daily.         Marland Kitchen omeprazole (PRILOSEC) 20 MG capsule   Oral   Take 20 mg by mouth daily.         . pioglitazone-metformin (ACTOPLUS MET) 15-850 MG per tablet   Oral   Take 1 tablet by mouth daily.         . vitamin E 400 UNIT capsule   Oral   Take 400 Units by mouth daily.           BP 143/96  Pulse 103  Temp(Src) 97.6 F (36.4 C) (Oral)  Resp 16  SpO2 100%  Physical Exam  Constitutional: She appears  well-developed and well-nourished.  HENT:  Head: Normocephalic and atraumatic.  Mouth/Throat: Oropharynx is clear and moist.  Eyes: EOM are normal. Pupils are equal, round, and reactive to light.  Neck: Neck supple.  Cardiovascular: Normal rate, regular rhythm and intact distal pulses.   Pulmonary/Chest: Effort normal and breath sounds normal. No stridor. No respiratory distress.  Abdominal: Soft. There is no tenderness.  Musculoskeletal: Normal range of motion. She exhibits no edema.  Neurological:  Awake, alert, follows limited commands, answers yes no questions with mild confusion, BUE shaking tremors  Skin: Skin is warm and dry.    ED Course  Procedures (including critical care time)  Labs Reviewed  GLUCOSE, CAPILLARY - Abnormal; Notable for the following:    Glucose-Capillary 25 (*)    All other components within normal limits  CBC - Abnormal; Notable for the following:    Hemoglobin 11.7 (*)    HCT 35.7 (*)    All other components within normal limits  COMPREHENSIVE METABOLIC PANEL - Abnormal; Notable for the following:    Glucose, Bld 52 (*)    Albumin 3.3 (*)    Total Bilirubin 0.2 (*)    All other components within normal limits  GLUCOSE, CAPILLARY - Abnormal; Notable for the following:    Glucose-Capillary 54 (*)    All other components within normal limits  POCT I-STAT, CHEM 8 - Abnormal; Notable for the following:    Glucose, Bld 49 (*)    All other components within normal limits  GLUCOSE, CAPILLARY  URINALYSIS, ROUTINE W REFLEX MICROSCOPIC   Dg Chest Portable 1 View  06/19/2012   *RADIOLOGY REPORT*  Clinical Data: Seizure, hypoglycemia  PORTABLE CHEST - 1 VIEW  Comparison: None.  Findings: Stable enlarged cardiac silhouette.  No effusion, infiltrate, pneumothorax.  Central venous pulmonary congestion.  IMPRESSION: Mild cardiomegaly and central venous congestion.   Original Report Authenticated By: Genevive Bi, M.D.    CRITICAL CARE Performed by:  Sunnie Nielsen Total critical care time: 30 Critical care time was exclusive of separately billable procedures and treating other patients. Critical care was necessary to treat or prevent imminent or life-threatening deterioration. Critical care was time spent personally by me on  the following activities: development of treatment plan with patient and/or surrogate as well as nursing, discussions with consultants, evaluation of patient's response to treatment, examination of patient, obtaining history from patient or surrogate, ordering and performing treatments and interventions, ordering and review of laboratory studies, ordering and review of radiographic studies, pulse oximetry and re-evaluation of patient's condition. IV D50 bolus and drip, improving condition, is on oral and SQ insulin. MED consult and DR Houston Siren to admit  MDM   Persistent Hypoglycemia, repeat visit in less than 24 hours for same  Requiring IV drip, serial evaluations - tremor resolved, mentation improving  Labs, imaging  MED admit      Sunnie Nielsen, MD 06/19/12 [phone]

## 2012-06-19 NOTE — H&P (Signed)
[facility] History and Physical  Tammy Arellano:[id number] DOB: 08-29-1965    PCP:   Juline Patch, MD   Chief Complaint: Recurrent severe hypoglycemia.  HPI: Tammy Arellano is an 47 y.o. female with hx of DM2 Dx since she was 47 yo, Hx of seizure vs pseudosz on anticonvulsive therapy, returned to [facility] ER after being seen at [facility] ER for seizure earlier today due to hypoglycemia.  She was discharge from there, but was having hypoglycemic symptoms and was "shaking".  Evaluation in the ER showed BS of 49, and despite eating and having D50, her BS still hovering around 50's.   She takes 20 units of Levemir along with meal coverage with novolog, and was recently started on Metformin and Actos. She has had glycoHb of 12, and thus the changes in her meds.  Hospitalist was asked to consult on her recurrent hypoglycemia.  Rewiew of Systems:  Constitutional: Negative for malaise, fever and chills. No significant weight loss or weight gain Eyes: Negative for eye pain, redness and discharge, diplopia, visual changes, or flashes of light. ENMT: Negative for ear pain, hoarseness, nasal congestion, sinus pressure and sore throat. No headaches; tinnitus, drooling, or problem swallowing. Cardiovascular: Negative for chest pain, palpitations, diaphoresis, dyspnea and peripheral edema. ; No orthopnea, PND Respiratory: Negative for cough, hemoptysis, wheezing and stridor. No pleuritic chestpain. Gastrointestinal: Negative for nausea, vomiting, diarrhea, constipation, abdominal pain, melena, blood in stool, hematemesis, jaundice and rectal bleeding.    Genitourinary: Negative for frequency, dysuria, incontinence,flank pain and hematuria; Musculoskeletal: Negative for back pain and neck pain. Negative for swelling and trauma.;  Skin: . Negative for pruritus, rash, abrasions, bruising and skin lesion.; ulcerations Neuro: Negative for headache, lightheadedness and neck stiffness. Negative for weakness,  altered level of consciousness , altered mental status, extremity weakness, burning feet, involuntary movement, seizure and syncope.  Psych: negative for anxiety, depression, insomnia, tearfulness, panic attacks, hallucinations, paranoia, suicidal or homicidal ideation    Past Medical History  Diagnosis Date  . Seizures   . Diabetes mellitus   . Mental retardation   . Pseudoseizures     Past Surgical History  Procedure Laterality Date  . No past surgeries      Medications:  HOME MEDS: Prior to Admission medications   Medication Sig Start Date End Date Taking? Authorizing Provider  albuterol (PROVENTIL HFA;VENTOLIN HFA) 108 (90 BASE) MCG/ACT inhaler Inhale 2 puffs into the lungs every 6 (six) hours as needed.   Yes Historical Provider, MD  amLODipine (NORVASC) 10 MG tablet Take 10 mg by mouth daily.   Yes Historical Provider, MD  Ascorbic Acid (VITAMIN C) 100 MG tablet Take 100 mg by mouth daily.   Yes Historical Provider, MD  ferrous fumarate (HEMOCYTE - 106 MG FE) 325 (106 FE) MG TABS Take 1 tablet by mouth.   Yes Historical Provider, MD  insulin aspart (NOVOLOG) 100 UNIT/ML injection Inject 15 Units into the skin 3 (three) times daily between meals as needed. For blood sugar   Yes Historical Provider, MD  insulin glargine (LANTUS) 100 UNIT/ML injection Inject 25 Units into the skin at bedtime. 08/25/11 08/24/12 Yes Nishant Dhungel, MD  lamoTRIgine (LAMICTAL) 200 MG tablet Take 200 mg by mouth 2 (two) times daily. Pt has to have brand name per pt's pharmacy daw 1   Yes Historical Provider, MD  levETIRAcetam (KEPPRA) 500 MG tablet Take 2,000 mg by mouth 2 (two) times daily. 500mg  in the morning. 2000mg  at night.   Yes Historical Provider, MD  loratadine (CLARITIN) 10 MG tablet Take 10 mg by mouth daily.   Yes Historical Provider, MD  montelukast (SINGULAIR) 10 MG tablet Take 10 mg by mouth at bedtime.   Yes Historical Provider, MD  Multiple Vitamin (MULTIVITAMIN WITH MINERALS) TABS Take  1 tablet by mouth daily.   Yes Historical Provider, MD  omeprazole (PRILOSEC) 20 MG capsule Take 20 mg by mouth daily.   Yes Historical Provider, MD  pioglitazone-metformin (ACTOPLUS MET) 15-850 MG per tablet Take 1 tablet by mouth daily.   Yes Historical Provider, MD  vitamin E 400 UNIT capsule Take 400 Units by mouth daily.   Yes Historical Provider, MD     Allergies:  Allergies  Allergen Reactions  . Dilantin (Phenytoin Sodium Extended)     seizures   . Lip Balm (Chapstick) Other (See Comments)    Lips swell  . Penicillins   . Furosemide Rash  . Shellfish Allergy Rash    Social History:   reports that she has never smoked. She has never used smokeless tobacco. She reports that she does not drink alcohol or use illicit drugs.  Family History: Family History  Problem Relation Age of Onset  . Osteoarthritis Mother   . Diabetes Mother   . Cancer Father      Physical Exam: Filed Vitals:   06/19/12 0230 06/19/12 0300 06/19/12 0330 06/19/12 0436  BP:   127/81 133/84  Pulse: 95 92 92 94  Temp:    98.1 F (36.7 C)  TempSrc:    Oral  Resp:    18  Height:    5\' 3"  (1.6 m)  Weight:    107.049 kg (236 lb)  SpO2: 99% 98% 98% 98%   Blood pressure 133/84, pulse 94, temperature 98.1 F (36.7 C), temperature source Oral, resp. rate 18, height 5\' 3"  (1.6 m), weight 107.049 kg (236 lb), SpO2 98.00%.  GEN:  Pleasant  patient lying in the stretcher in no acute distress; cooperative with exam. PSYCH:  alert and oriented x4; does not appear anxious or depressed; affect is appropriate. HEENT: Mucous membranes pink and anicteric; PERRLA; EOM intact; no cervical lymphadenopathy nor thyromegaly or carotid bruit; no JVD; There were no stridor. Neck is very supple. Breasts:: Not examined CHEST WALL: No tenderness CHEST: Normal respiration, clear to auscultation bilaterally.  HEART: Regular rate and rhythm.  There are no murmur, rub, or gallops.   BACK: No kyphosis or scoliosis; no CVA  tenderness ABDOMEN: soft and non-tender; no masses, no organomegaly, normal abdominal bowel sounds; no pannus; no intertriginous candida. There is no rebound and no distention. Rectal Exam: Not done EXTREMITIES: No bone or joint deformity; age-appropriate arthropathy of the hands and knees; no edema; no ulcerations.  There is no calf tenderness. Genitalia: not examined PULSES: 2+ and symmetric SKIN: Normal hydration no rash or ulceration CNS: Cranial nerves 2-12 grossly intact no focal lateralizing neurologic deficit.  Speech is fluent; uvula elevated with phonation, facial symmetry and tongue midline. DTR are normal bilaterally, cerebella exam is intact, barbinski is negative and strengths are equaled bilaterally.  No sensory loss.   Labs on Admission:  Basic Metabolic Panel:  Recent Labs Lab 06/18/12 0935 06/19/12 0109 06/19/12 0116  NA 135 138 142  K 5.6* 3.5 3.5  CL 100 101 104  CO2 27 28  --   GLUCOSE 228* 52* 49*  BUN 8 14 14   CREATININE 0.35* 0.63 0.70  CALCIUM 9.4 9.4  --    Liver Function Tests:  Recent Labs  Lab 06/18/12 0935 06/19/12 0109  AST 34 19  ALT 19 16  ALKPHOS 57 61  BILITOT 0.2* 0.2*  PROT 7.8 7.3  ALBUMIN 3.3* 3.3*   No results found for this basename: LIPASE, AMYLASE,  in the last 168 hours No results found for this basename: AMMONIA,  in the last 168 hours CBC:  Recent Labs Lab 06/18/12 0935 06/19/12 0109 06/19/12 0116  WBC 4.8 6.7  --   NEUTROABS 3.8  --   --   HGB 12.9 11.7* 12.9  HCT 39.0 35.7* 38.0  MCV 80.7 81.0  --   PLT 194 165  --    Cardiac Enzymes: No results found for this basename: CKTOTAL, CKMB, CKMBINDEX, TROPONINI,  in the last 168 hours  CBG:  Recent Labs Lab 06/19/12 0049 06/19/12 0130 06/19/12 0230 06/19/12 0350 06/19/12 0416  GLUCAP 99 54* 67* 187* 193*     Radiological Exams on Admission: Dg Chest Portable 1 View  06/19/2012   *RADIOLOGY REPORT*  Clinical Data: Seizure, hypoglycemia  PORTABLE CHEST - 1  VIEW  Comparison: None.  Findings: Stable enlarged cardiac silhouette.  No effusion, infiltrate, pneumothorax.  Central venous pulmonary congestion.  IMPRESSION: Mild cardiomegaly and central venous congestion.   Original Report Authenticated By: Genevive Bi, M.D.    Assessment/Plan Present on Admission:  . Hypoglycemic reaction . Uncontrolled diabetes mellitus . Pseudoseizures . DM2 (diabetes mellitus, type 2) . Seizure disorder  PLAN:  She should be admitted and I will admit her to telemetry.  I think she should be off her Metformin and Actos.  For now, will reduce her Levemir to 10mg  daily, and reduce her meal coverages.  If her BS goes up, which I suspect it will, then increase meal coverage first, then add Metformin into her regimen.  She may do better with Andreas Ohm or Forsiga (glycouria agents), but I will defer those to her PCP.  For her seizure, will continue her on her ACD.  Her seizures recently are likely from her severe hypoglycemia.  She is stable, full code, and will be admitted to [facility] service.  Thank you for allowing me to participate in the care of this nice patient.   Other plans as per orders.  Code Status: FULL Unk Lightning, MD. [facility] Pager [phone] 7pm to 7am.  06/19/2012, 4:54 AM

## 2012-06-19 NOTE — ED Notes (Signed)
Per pt's family - pt was seen at [facility] earlier today, pt was reported to be hypoglycemic at that time w/ a CBG of 22. Pt w/ hx of diabetes - on arrival pt w/ jerking movements (isolated mostly to arms), eyes open and able to respond and follow commands.

## 2012-06-19 NOTE — Progress Notes (Addendum)
[facility] PROGRESS NOTE  NATALEAH SCIONEAUX ZDG:[id number] DOB: [birth year] DOA: 06/19/2012 PCP: Juline Patch, MD  Assessment/Plan  Hypoglycemia:  Patient's fingersticks have been stable during the day today.  Reviewed patient's fingersticks in detail this evening. The patient's states that she has had fingersticks between 120-140 thing in the morning until the last 2 mornings. The patient had increased accidentally her evening long-acting insulin from 35 units to 45 units and subsequently had severe hypoglycemia overnight.  Her hemoglobin A1c has been very high however. She states that her fingersticks before lunch have been around 120-140, before dinner quite high at 400, and before bed quite low sometimes in the 40s to 60s.  She has been taking standing insulin aspart 10 units with meals. She does not have any sliding scale insulin.   -  I will be cautious and give her Lantus 20 units this evening before bed, but she will likely require close to 35 units. -  Continue 10 units of aspart with breakfast -  Increase her lunchtime insulin to 15 units to improve her before dinner fingersticks -  Decrease pre-dinner insulin to avoid before bed hypoglycemia -   no before bed insulin -  Followup morning fingersticks  -  Plan to discharge with metformin and discontinue other anti-hyperglycemics other than insulin -  Urinalysis and chest x-ray was negative for infection  Possible early acute kidney injury, creatinine increased from 0.35 to 0.7 from yesterday to today.  Patient received IV fluids today. -  Recheck BUN and creatinine in the morning  Seizure disorder, no seizures since admission.  Continue antiepileptic medications. Hypertension, pressure stable. Continue Norvasc Cardiomegaly and central venous congestion on chest x-ray suggestive of heart failure -  Outpatient echo  Diet:  Carbonate modified Access:  PIV IVF:  Off Proph:  Patient ambulating.    Code Status: Full Family  Communication: Spoke with patient and her mother Disposition Plan: Anticipate discharge to home tomorrow if fingersticks remain stable   Consultants:  None  Procedures:  None  Antibiotics:  None   HPI/Subjective:  Patient states that she feels much better this evening.  She is worried because her fingerstick is quite high right now before dinner (250).    Objective: Filed Vitals:   06/19/12 0800 06/19/12 0945 06/19/12 1200 06/19/12 1600  BP: 135/72 131/82 130/68 123/65  Pulse: 89 92 83 92  Temp: 97.7 F (36.5 C)  97.7 F (36.5 C) 98 F (36.7 C)  TempSrc:      Resp: 16  16 16   Height:      Weight:      SpO2: 100%  100% 100%    Intake/Output Summary (Last 24 hours) at 06/19/12 1724 Last data filed at 06/19/12 1200  Gross per 24 hour  Intake   1332 ml  Output      0 ml  Net   1332 ml   Filed Weights   06/19/12 0436  Weight: 107.049 kg (236 lb)    Exam:   General:  An obese African American female, No acute distress  HEENT:  NCAT, MMM  Cardiovascular:  RRR, nl S1, S2 no mrg, 2+ pulses, warm extremities  Respiratory:  CTAB, no increased WOB  Abdomen:   NABS, soft, NT/ND  MSK:   Normal tone and bulk, trace LEE  Neuro:  Grossly intact  Data Reviewed: Basic Metabolic Panel:  Recent Labs Lab 06/18/12 0935 06/19/12 0109 06/19/12 0116  NA 135 138 142  K 5.6* 3.5 3.5  CL 100  101 104  CO2 27 28  --   GLUCOSE 228* 52* 49*  BUN 8 14 14   CREATININE 0.35* 0.63 0.70  CALCIUM 9.4 9.4  --    Liver Function Tests:  Recent Labs Lab 06/18/12 0935 06/19/12 0109  AST 34 19  ALT 19 16  ALKPHOS 57 61  BILITOT 0.2* 0.2*  PROT 7.8 7.3  ALBUMIN 3.3* 3.3*   No results found for this basename: LIPASE, AMYLASE,  in the last 168 hours No results found for this basename: AMMONIA,  in the last 168 hours CBC:  Recent Labs Lab 06/18/12 0935 06/19/12 0109 06/19/12 0116  WBC 4.8 6.7  --   NEUTROABS 3.8  --   --   HGB 12.9 11.7* 12.9  HCT 39.0 35.7*  38.0  MCV 80.7 81.0  --   PLT 194 165  --    Cardiac Enzymes: No results found for this basename: CKTOTAL, CKMB, CKMBINDEX, TROPONINI,  in the last 168 hours BNP (last 3 results) No results found for this basename: PROBNP,  in the last 8760 hours CBG:  Recent Labs Lab 06/19/12 0350 06/19/12 0416 06/19/12 0722 06/19/12 1118 06/19/12 1645  GLUCAP 187* 193* 197* 177* 250*    No results found for this or any previous visit (from the past 240 hour(s)).   Studies: Dg Chest Portable 1 View  06/19/2012   *RADIOLOGY REPORT*  Clinical Data: Seizure, hypoglycemia  PORTABLE CHEST - 1 VIEW  Comparison: None.  Findings: Stable enlarged cardiac silhouette.  No effusion, infiltrate, pneumothorax.  Central venous pulmonary congestion.  IMPRESSION: Mild cardiomegaly and central venous congestion.   Original Report Authenticated By: Genevive Bi, M.D.    Scheduled Meds: . amLODipine  10 mg Oral Daily  . ferrous fumarate  1 tablet Oral Daily  . insulin aspart  0-9 Units Subcutaneous Q4H  . insulin detemir  10 Units Subcutaneous QHS  . lamoTRIgine  200 mg Oral BID  . levETIRAcetam  2,000 mg Oral BID  . loratadine  10 mg Oral Daily  . montelukast  10 mg Oral QHS  . multivitamin with minerals  1 tablet Oral Daily  . pantoprazole  40 mg Oral Daily   Continuous Infusions:   Principal Problem:   Hypoglycemic reaction Active Problems:   DM2 (diabetes mellitus, type 2)   Seizure disorder   Pseudoseizures   Uncontrolled diabetes mellitus    Time spent: 30 min    Kelda Azad, [facility]  [facility] Pager [phone]. If 7PM-7AM, please contact night-coverage at [email or web address], password [facility] 06/19/2012, 5:24 PM  LOS: 0 days

## 2012-06-19 NOTE — ED Notes (Signed)
Pt currently Ax4, Answering questions appropriately.

## 2012-06-20 DIAGNOSIS — G40909 Epilepsy, unspecified, not intractable, without status epilepticus: Secondary | ICD-10-CM | POA: Diagnosis not present

## 2012-06-20 DIAGNOSIS — E119 Type 2 diabetes mellitus without complications: Secondary | ICD-10-CM | POA: Diagnosis not present

## 2012-06-20 DIAGNOSIS — E162 Hypoglycemia, unspecified: Secondary | ICD-10-CM | POA: Diagnosis not present

## 2012-06-20 DIAGNOSIS — IMO0001 Reserved for inherently not codable concepts without codable children: Secondary | ICD-10-CM

## 2012-06-20 DIAGNOSIS — R569 Unspecified convulsions: Secondary | ICD-10-CM | POA: Diagnosis not present

## 2012-06-20 LAB — GLUCOSE, CAPILLARY
Glucose-Capillary: 145 mg/dL — ABNORMAL HIGH (ref 70–99)
Glucose-Capillary: 180 mg/dL — ABNORMAL HIGH (ref 70–99)
Glucose-Capillary: 247 mg/dL — ABNORMAL HIGH (ref 70–99)

## 2012-06-20 LAB — BASIC METABOLIC PANEL
CO2: 31 mEq/L (ref 19–32)
Glucose, Bld: 156 mg/dL — ABNORMAL HIGH (ref 70–99)
Potassium: 4.3 mEq/L (ref 3.5–5.1)
Sodium: 139 mEq/L (ref 135–145)

## 2012-06-20 MED ORDER — INSULIN ASPART 100 UNIT/ML ~~LOC~~ SOLN: 0.0000 [IU] | Freq: Three times a day (TID) | SUBCUTANEOUS | Status: DC

## 2012-06-20 MED ORDER — GLUCAGON (RDNA) 1 MG IJ KIT: 1.0000 mg | PACK | Freq: Once | INTRAMUSCULAR | Status: DC | PRN

## 2012-06-20 MED ORDER — INSULIN GLARGINE 100 UNIT/ML ~~LOC~~ SOLN: 20.0000 [IU] | Freq: Every day | SUBCUTANEOUS | Status: DC

## 2012-06-20 MED ORDER — INSULIN ASPART 100 UNIT/ML ~~LOC~~ SOLN
0.0000 [IU] | Freq: Three times a day (TID) | SUBCUTANEOUS | Status: DC
  Administered 2012-06-20: 1 [IU] via SUBCUTANEOUS

## 2012-06-20 NOTE — Progress Notes (Addendum)
Inpatient Diabetes Program Recommendations  AACE/ADA: New Consensus Statement on Inpatient Glycemic Control (2013)  Target Ranges:  Prepandial:   less than 140 mg/dL      Peak postprandial:   less than 180 mg/dL (1-2 hours)      Critically ill patients:  140 - 180 mg/dL   Reason for Visit: Consult for OP Diabetes Education  Note: Patient had already been discharged and left hospital before being seen.  Order placed with the [facility]) for OP diabetes education follow-up.  Patient will be contacted by phone by [facility] to arrange appointment. Thank you.  Brogan England S. Elsie Lincoln, RN, CNS, CDE Inpatient Diabetes Program, team pager [phone]

## 2012-06-20 NOTE — Progress Notes (Signed)
Patient alert and oriented x4.  Patient and patient's mother given discharge instructions.  Denies any questions or concerns.  Pt given rx and additional information about insulin, hypo/hyperglycemia.  IV access removed with drsg to site. Telemetry box removed and placed in designated location. Pt discharge to private vehicle to home per wheelchair to home with mother.

## 2012-06-20 NOTE — Discharge Summary (Signed)
Physician Discharge Summary  Tammy Arellano ZOX:[id number] DOB: 09/10/1965 DOA: 06/19/2012  PCP: Juline Patch, MD  Admit date: 06/19/2012 Discharge date: 06/20/2012  Recommendations for Outpatient Follow-up:  1. Followup with primary care doctor in one week to review fingersticks.  Consider outpatient echo due to suggestion of LVH/heart failure on chest x-ray.  She is currently asymptomatic. 2. Followup with neurologist due to increased seizure frequency.  Discharge Diagnoses:  Principal Problem:   Hypoglycemic reaction Active Problems:   DM2 (diabetes mellitus, type 2)   Seizure disorder   Pseudoseizures   Uncontrolled diabetes mellitus   Discharge Condition: Stable, improved  Diet recommendation: Diabetic  Wt Readings from Last 3 Encounters:  06/19/12 107.049 kg (236 lb)  08/24/11 106.4 kg (234 lb 9.1 oz)    History of present illness:   Tammy Arellano is an 47 y.o. female with hx of DM2 Dx since she was 47 yo, Hx of seizure vs pseudosz on anticonvulsive therapy, returned to [facility] ER after being seen at [facility] ER for seizure earlier today due to hypoglycemia. She was discharge from there, but was having hypoglycemic symptoms and was "shaking". Evaluation in the ER showed BS of 49, and despite eating and having D50, her BS still hovering around 50's. She takes 20 units of Levemir along with meal coverage with novolog, and was recently started on Metformin and Actos. She has had glycoHb of 12, and thus the changes in her meds. Hospitalist was asked to consult on her recurrent hypoglycemia.   Hospital Course:   Hypoglycemia: Patient's fingersticks stabilized on a dextrose infusion which she remained on for 12-24 hours and transitioned off without any hypoglycemia.  Urinalysis and chest x-ray was negative for infection.  Reviewed patient's fingersticks in detail. The patient's states that she has had fingersticks between 120-140 thing in the morning until the last 2 mornings. The patient  had increased accidentally her evening long-acting insulin from 35 units to 45 units and subsequently had severe hypoglycemia overnight. Her hemoglobin A1c has been very high however. She states that her fingersticks before lunch have been around 120-140, before dinner quite high at 400, and before bed quite low sometimes in the 40s to 60s. She has been taking standing insulin aspart 10 units with meals. She does not have any sliding scale insulin.  Decrease her Lantus 20 units this evening before bed and her fingersticks on her BMP to following morning was 156 and her fingerstick by capillary stick was 82.  I think this dose should be okay.  I think that she may need about 10 units for breakfast, 15 units for lunch, and 5 units of aspart for dinner.  While the patient is in the hospital it is hard to get a feel for her insulin requirements, and she does not have her fingerstick bulk with her. For now I will discharge her on a sliding scale of aspart and have her followup with her primary care doctor with as soon as possible to review her fingersticks in the amount of insulin that she has been receiving.    Possible early acute kidney injury, creatinine increased from 0.35 to 0.7 from May 25 to 26. Patient received IV fluids and her creatinine was 0.54 the day of discharge.   Seizure disorder, no seizures since admission. Continue antiepileptic medications.  Hypertension, pressure stable. Continued Norvasc  Cardiomegaly and central venous congestion on chest x-ray of LVH and possible CHF.  Outpatient echo.    Consultants:  None Procedures:  None Antibiotics:  None    Discharge Exam: Filed Vitals:   06/20/12 1023  BP: 122/79  Pulse:   Temp:   Resp:    Filed Vitals:   06/19/12 1600 06/19/12 2003 06/20/12 0445 06/20/12 1023  BP: 123/65 102/68 122/85 122/79  Pulse: 92 87 66   Temp: 98 F (36.7 C) 98.7 F (37.1 C) 97.8 F (36.6 C)   TempSrc:  Oral Oral   Resp: 16 18 18    Height:       Weight:      SpO2: 100% 97% 98%    Patient states that she feels well.    General: An obese African American female, No acute distress, sitting up in bed HEENT: NCAT, MMM  Cardiovascular: RRR, nl S1, S2 no mrg, 2+ pulses, warm extremities  Respiratory: CTAB, no increased WOB  Abdomen: NABS, soft, NT/ND  MSK: Normal tone and bulk, trace LEE  Neuro: Grossly intact   Discharge Instructions      Discharge Orders   Future Orders Complete By Expires     Call MD for:  difficulty breathing, headache or visual disturbances  As directed     Call MD for:  extreme fatigue  As directed     Call MD for:  hives  As directed     Call MD for:  persistant dizziness or light-headedness  As directed     Call MD for:  persistant nausea and vomiting  As directed     Call MD for:  severe uncontrolled pain  As directed     Call MD for:  temperature >100.4  As directed     Diet Carb Modified  As directed     Discharge instructions  As directed     Comments:      You were hospitalized with low blood sugar.  You probably been taking too much long acting insulin at home. Normally I would like to give you a standing amount of insulin for your mealtime coverage. It seems like you should probably have a more insulin around lunchtime and less insulin around dinnertime. Your fingersticks in the hospital are sometimes unreliable however. I will give you a sliding-scale of insulin to use at home until you are able to followup with her primary care doctor. You may benefit from seeing an endocrinologist for a diabetes specialist as an outpatient. Talk to your primary care doctor about a referral.  Please talk to your neurologist about your seizures. I think your long seizure the other night was probably due to hypoglycemia.  Your neurologist may want to increase your seizure medications however.  Please keep a close log of your fingersticks in the amount of insulin you're giving yourself with each meal.  Take this with  you to your primary care doctor.  Please read over the information regarding hypoglycemia carefully.  I will give you a prescription for glucagon to use in case of an emergency.  If you are awake and your fingerstick is low you may use juice to increase your blood sugar.  If you were found unresponsive or having a seizure with a low fingerstick however, your family member or friend may administer glucagon into your muscle to save your life.  Please call your doctor if you have any fingersticks less than 70 or greater than 350.    Increase activity slowly  As directed         Medication List    STOP taking these medications       pioglitazone-metformin 15-850  MG per tablet  Commonly known as:  ACTOPLUS MET      TAKE these medications       albuterol 108 (90 BASE) MCG/ACT inhaler  Commonly known as:  PROVENTIL HFA;VENTOLIN HFA  Inhale 2 puffs into the lungs every 6 (six) hours as needed.     amLODipine 10 MG tablet  Commonly known as:  NORVASC  Take 10 mg by mouth daily.     ferrous fumarate 325 (106 FE) MG Tabs  Commonly known as:  HEMOCYTE - 106 mg FE  Take 1 tablet by mouth.     glucagon 1 MG injection  Inject 1 mg into the vein once as needed (low blood sugar and not able to drink juice).     insulin aspart 100 UNIT/ML injection  Commonly known as:  novoLOG  Inject 0-9 Units into the skin 3 (three) times daily with meals. Per sliding scale     insulin glargine 100 UNIT/ML injection  Commonly known as:  LANTUS  Inject 0.2 mLs (20 Units total) into the skin at bedtime.     lamoTRIgine 200 MG tablet  Commonly known as:  LAMICTAL  Take 200 mg by mouth 2 (two) times daily. Pt has to have brand name per pt's pharmacy daw 1     levETIRAcetam 500 MG tablet  Commonly known as:  KEPPRA  Take 2,000 mg by mouth 2 (two) times daily. 500mg  in the morning. 2000mg  at night.     loratadine 10 MG tablet  Commonly known as:  CLARITIN  Take 10 mg by mouth daily.     montelukast 10 MG  tablet  Commonly known as:  SINGULAIR  Take 10 mg by mouth at bedtime.     multivitamin with minerals Tabs  Take 1 tablet by mouth daily.     omeprazole 20 MG capsule  Commonly known as:  PRILOSEC  Take 20 mg by mouth daily.     vitamin C 100 MG tablet  Take 100 mg by mouth daily.     vitamin E 400 UNIT capsule  Take 400 Units by mouth daily.       Follow-up Information   Follow up with PANG,RICHARD, MD. Schedule an appointment as soon as possible for a visit in 1 week.   Contact information:   [address] [place], [address] [place] [address] [phone]        The results of significant diagnostics from this hospitalization (including imaging, microbiology, ancillary and laboratory) are listed below for reference.    Significant Diagnostic Studies: Dg Chest Portable 1 View  06/19/2012   *RADIOLOGY REPORT*  Clinical Data: Seizure, hypoglycemia  PORTABLE CHEST - 1 VIEW  Comparison: None.  Findings: Stable enlarged cardiac silhouette.  No effusion, infiltrate, pneumothorax.  Central venous pulmonary congestion.  IMPRESSION: Mild cardiomegaly and central venous congestion.   Original Report Authenticated By: Genevive Bi, M.D.    Microbiology: No results found for this or any previous visit (from the past 240 hour(s)).   Labs: Basic Metabolic Panel:  Recent Labs Lab 06/18/12 0935 06/19/12 0109 06/19/12 0116 06/20/12 0535  NA 135 138 142 139  K 5.6* 3.5 3.5 4.3  CL 100 101 104 102  CO2 27 28  --  31  GLUCOSE 228* 52* 49* 156*  BUN 8 14 14 10   CREATININE 0.35* 0.63 0.70 0.54  CALCIUM 9.4 9.4  --  9.5   Liver Function Tests:  Recent Labs Lab 06/18/12 0935 06/19/12 0109  AST 34 19  ALT 19 16  ALKPHOS 57 61  BILITOT 0.2* 0.2*  PROT 7.8 7.3  ALBUMIN 3.3* 3.3*   No results found for this basename: LIPASE, AMYLASE,  in the last 168 hours No results found for this basename: AMMONIA,  in the last 168 hours CBC:  Recent Labs Lab 06/18/12 0935  06/19/12 0109 06/19/12 0116  WBC 4.8 6.7  --   NEUTROABS 3.8  --   --   HGB 12.9 11.7* 12.9  HCT 39.0 35.7* 38.0  MCV 80.7 81.0  --   PLT 194 165  --    Cardiac Enzymes: No results found for this basename: CKTOTAL, CKMB, CKMBINDEX, TROPONINI,  in the last 168 hours BNP: BNP (last 3 results) No results found for this basename: PROBNP,  in the last 8760 hours CBG:  Recent Labs Lab 06/19/12 2353 06/20/12 0444 06/20/12 0729 06/20/12 0848 06/20/12 0930  GLUCAP 247* 180* 82 124* 145*    Time coordinating discharge: 45 minutes  Signed:  Antonette Hendricks  [facility] 06/20/2012, 11:53 AM

## 2012-07-03 DIAGNOSIS — G40909 Epilepsy, unspecified, not intractable, without status epilepticus: Secondary | ICD-10-CM | POA: Diagnosis not present

## 2012-07-03 DIAGNOSIS — R5381 Other malaise: Secondary | ICD-10-CM | POA: Diagnosis not present

## 2012-07-03 DIAGNOSIS — I10 Essential (primary) hypertension: Secondary | ICD-10-CM | POA: Diagnosis not present

## 2012-07-03 DIAGNOSIS — Z006 Encounter for examination for normal comparison and control in clinical research program: Secondary | ICD-10-CM | POA: Diagnosis not present

## 2012-07-03 DIAGNOSIS — R5383 Other fatigue: Secondary | ICD-10-CM | POA: Diagnosis not present

## 2012-07-03 DIAGNOSIS — IMO0001 Reserved for inherently not codable concepts without codable children: Secondary | ICD-10-CM | POA: Diagnosis not present

## 2012-07-12 ENCOUNTER — Telehealth: Payer: Self-pay

## 2012-07-12 NOTE — Telephone Encounter (Signed)
Message copied by [facility] on Wed Jul 12, 2012  4:41 PM ------      Message from: Seth Bake      Created: Mon Jul 10, 2012  4:13 PM      Contact: [phone]       States she has been in the hospital several times due to her seizures and must be seen asap.  She was there about the middle of the month and was instructed to see him asap but she has not been able to call.  Insists January just wont do.  ------

## 2012-07-12 NOTE — Telephone Encounter (Signed)
I called patient. She indicates that she recently has had frequent seizures. The patient has had documented low blood sugars into the 30s, and her diabetic medications have been changed to prevent hypoglycemia. The patient in the past has had seizures when she has not taken her medications properly. We will try to get her in for a revisit. The patient also has a history of pseudoseizures.

## 2012-07-12 NOTE — Telephone Encounter (Signed)
Dr. Anne Hahn, I returned patient's call and reviewed medical encounters. Patient was last seen by at [facility] in November 2013. She has been having frequent seizures that have resulted in repeated ER visits. Patient is requesting that we fit her in for an office visit. Please advise.

## 2012-07-24 DIAGNOSIS — IMO0001 Reserved for inherently not codable concepts without codable children: Secondary | ICD-10-CM | POA: Diagnosis not present

## 2012-08-11 ENCOUNTER — Other Ambulatory Visit: Payer: Self-pay

## 2012-08-11 MED ORDER — LAMOTRIGINE 200 MG PO TABS: 200.0000 mg | ORAL_TABLET | Freq: Two times a day (BID) | ORAL | Status: DC

## 2012-08-15 ENCOUNTER — Encounter: Payer: Medicare Other | Attending: Internal Medicine | Admitting: *Deleted

## 2012-08-15 ENCOUNTER — Encounter: Payer: Self-pay | Admitting: *Deleted

## 2012-08-15 VITALS — Ht 64.5 in | Wt 231.8 lb

## 2012-08-15 DIAGNOSIS — E1165 Type 2 diabetes mellitus with hyperglycemia: Secondary | ICD-10-CM

## 2012-08-15 DIAGNOSIS — Z713 Dietary counseling and surveillance: Secondary | ICD-10-CM | POA: Insufficient documentation

## 2012-08-15 DIAGNOSIS — E161 Other hypoglycemia: Secondary | ICD-10-CM

## 2012-08-15 DIAGNOSIS — E1169 Type 2 diabetes mellitus with other specified complication: Secondary | ICD-10-CM | POA: Insufficient documentation

## 2012-08-15 DIAGNOSIS — IMO0002 Reserved for concepts with insufficient information to code with codable children: Secondary | ICD-10-CM | POA: Insufficient documentation

## 2012-08-15 DIAGNOSIS — E119 Type 2 diabetes mellitus without complications: Secondary | ICD-10-CM

## 2012-08-15 NOTE — Patient Instructions (Addendum)
Plan:  Aim for 3 Carb Choices per meal (45 grams) +/- 1 either way  Aim for 0-2 Carbs per snack if hungry  Consider reading food labels for Total Carbohydrate of foods Consider taking medication Lantus @ 25 units in AM and Novolog only if BG is too high as directed by MD

## 2012-08-15 NOTE — Progress Notes (Signed)
  Medical Nutrition Therapy:  Appt start time: 1200 end time:  1300.  Assessment:  Primary concerns today: patient here for diabetes. Lives with sister, Mom brings her to MD appointments. She does her own cooking and shops for herself with her mother. SMBG once each AM, stated range is 85 to 295. Has had hypoglycemia with BG down to 22 mg/dl. She does get symptoms of feeling shaky and dizzy. She attends a [employer] during the day.  MEDICATIONS: see list. She states she takes her diabetes medications as follows:  Actos plus Metformin, Lantus @ 20 units at bedtime (she states she only takes as needed) and Novolog @ 25 units per day which she takes in the AM   DIETARY INTAKE:  Usual eating pattern includes 3 meals and 3 snacks per day.  Everyday foods include good variety of all food groups.  Avoided foods include: none stated.    24-hr recall:  B ( AM): wheaties with 2% milk OR 1 piece of sausage, OR breakfast shake with variety of fresh fruits and yogurt water Snk ( AM): chips OR 5-6 cookies at the [facility], unsweet tea,  L ( PM): brings from home: [place] wing stew with rice, OR sandwich, fresh fruit, water Snk ( PM): fresh fruit D ( PM): enjoys chicken, stir fried, starch like rice, vegetables, usually a salad, water Snk ( PM): popsicle  OR [place] Ice Beverages: water  Usual physical activity: walks or plays basketball at [employer]  Estimated energy needs: 1400 calories 158 g carbohydrates 105 g protein 39 g fat  Progress Towards Goal(s):  In progress.   Nutritional Diagnosis:  NB-1.1 Food and nutrition-related knowledge deficit As related to diabetes control.  As evidenced by heistory of hypoglycemia.    Intervention:  Nutrition counseling and diabetes education initiated. Discussed basic physiology of diabetes, SMBG, Carb Counting and reading food labels, and insulin action. Informed her of importance of taking Lantus every day at set time of day and to use Novolog only to  correct high BG's. She expressed good verbal understanding and information was provided in writing. Discussed causes, symptoms and treatment of hypoglycemia to help prevent future incidences.  Plan:  Aim for 3 Carb Choices per meal (45 grams) +/- 1 either way  Aim for 0-2 Carbs per snack if hungry  Consider reading food labels for Total Carbohydrate of foods Consider taking medication Lantus @ 25 units in AM and Novolog only if BG is too high as directed by MD  Handouts given during visit include: Living Well with Diabetes Carb Counting and Food Label handouts Meal Plan Card Insulin Handout  Monitoring/Evaluation:  Dietary intake, exercise, insulin administration times, and body weight in 4 week(s).

## 2012-08-18 DIAGNOSIS — IMO0001 Reserved for inherently not codable concepts without codable children: Secondary | ICD-10-CM | POA: Diagnosis not present

## 2012-08-18 DIAGNOSIS — I10 Essential (primary) hypertension: Secondary | ICD-10-CM | POA: Diagnosis not present

## 2012-08-23 DIAGNOSIS — IMO0001 Reserved for inherently not codable concepts without codable children: Secondary | ICD-10-CM | POA: Diagnosis not present

## 2012-08-28 DIAGNOSIS — E119 Type 2 diabetes mellitus without complications: Secondary | ICD-10-CM | POA: Diagnosis not present

## 2012-09-21 DIAGNOSIS — Z23 Encounter for immunization: Secondary | ICD-10-CM | POA: Diagnosis not present

## 2012-09-21 DIAGNOSIS — IMO0001 Reserved for inherently not codable concepts without codable children: Secondary | ICD-10-CM | POA: Diagnosis not present

## 2012-09-26 ENCOUNTER — Encounter: Payer: Self-pay | Admitting: *Deleted

## 2012-09-26 ENCOUNTER — Encounter: Payer: Medicare Other | Attending: Internal Medicine | Admitting: *Deleted

## 2012-09-26 VITALS — Ht 64.5 in | Wt 229.6 lb

## 2012-09-26 DIAGNOSIS — E1169 Type 2 diabetes mellitus with other specified complication: Secondary | ICD-10-CM | POA: Insufficient documentation

## 2012-09-26 DIAGNOSIS — Z713 Dietary counseling and surveillance: Secondary | ICD-10-CM | POA: Diagnosis not present

## 2012-09-26 DIAGNOSIS — IMO0002 Reserved for concepts with insufficient information to code with codable children: Secondary | ICD-10-CM | POA: Insufficient documentation

## 2012-09-26 DIAGNOSIS — E119 Type 2 diabetes mellitus without complications: Secondary | ICD-10-CM

## 2012-09-26 DIAGNOSIS — E1165 Type 2 diabetes mellitus with hyperglycemia: Secondary | ICD-10-CM | POA: Insufficient documentation

## 2012-09-26 NOTE — Patient Instructions (Signed)
Plan:  Continue to aim for 3 Carb Choices per meal (45 grams) +/- 1 either way  Aim for 0-2 Carbs per snack if hungry  Continue reading food labels for Total Carbohydrate of foods Continue taking medication Lantus @ 25 units in AM and Novolog only if BG is too high as directed by MD Continue with your current activity level, great job! Consider purchasing Calorie [employer]are Book as reference for food choices

## 2012-09-26 NOTE — Progress Notes (Signed)
  Medical Nutrition Therapy:  Appt start time: 0830 end time:  0900.  Assessment:  Primary concerns today: patient here for diabetes follow up visit. BGs are better controlled with now low BGs since last visit. States she is now taking the Lantus more consistently at night and takes Novolog in the AM as needed which is not very often. States she likes Carb Counting but gets confused when she eats out. States she is more active by playing basket ball, volley ball, tennis and walking a lot! Her goal is to be able to get her own apartment when she is able to.  MEDICATIONS: see list. She states she takes her diabetes medications as follows:  Actos plus Metformin, Lantus @ 20 units at bedtime and Novolog @ 25 units per day which she takes in the AM as needed.   DIETARY INTAKE:  Usual eating pattern includes 3 meals and 3 snacks per day.  Everyday foods include good variety of all food groups.  Avoided foods include: none stated.    24-hr recall:  B ( AM): wheaties with 2% milk OR 1 piece of sausage, OR breakfast shake with variety of fresh fruits and yogurt OR bowl of oatmeal, water Snk ( AM): chips OR 5-6 cookies at the Day Program, unsweet tea,  L ( PM): brings from home: [place] wing stew with rice, OR sandwich, fresh fruit, water Snk ( PM): fresh fruit D ( PM): enjoys chicken, stir fried, starch like rice, vegetables, usually a salad, water Snk ( PM): popsicle  OR [place] Ice Beverages: water  Usual physical activity: walks or plays basketball at [employer]  Estimated energy needs: 1400 calories 158 g carbohydrates 105 g protein 39 g fat  Progress Towards Goal(s):  In progress.   Nutritional Diagnosis:  NB-1.1 Food and nutrition-related knowledge deficit As related to diabetes control.  As evidenced by history of hypoglycemia.    Intervention: Commended her on her success with weight loss and improved BG control. Encouraged her to continue with her current activity level and reviewed  carb counting again today. She prefers to count with grams vs. Carb choices. Offered her the suggestion of buying the Calorie [employer] Book as a resource of carb information when she eats out. Plan to continue with Carb Counting at next visit also    Plan:  Continue to aim for 3 Carb Choices per meal (45 grams) +/- 1 either way  Aim for 0-2 Carbs per snack if hungry  Continue reading food labels for Total Carbohydrate of foods Continue taking medication Lantus @ 25 units in AM and Novolog only if BG is too high as directed by MD Continue with your current activity level, great job! Consider purchasing Calorie [employer]oke Dare Book as reference for food choices   Handouts given during visit include: Carb Counting and Food Label handouts Meal Plan Card  Monitoring/Evaluation:  Dietary intake, exercise, insulin administration times, and body weight in 4 week(s).

## 2012-10-24 ENCOUNTER — Ambulatory Visit: Payer: Medicare Other | Admitting: *Deleted

## 2012-11-21 ENCOUNTER — Ambulatory Visit: Payer: Medicare Other | Admitting: *Deleted

## 2012-11-28 ENCOUNTER — Ambulatory Visit: Payer: Medicare Other | Admitting: *Deleted

## 2012-12-06 ENCOUNTER — Other Ambulatory Visit: Payer: Self-pay | Admitting: Neurology

## 2012-12-06 DIAGNOSIS — Z0000 Encounter for general adult medical examination without abnormal findings: Secondary | ICD-10-CM | POA: Diagnosis not present

## 2012-12-06 DIAGNOSIS — I10 Essential (primary) hypertension: Secondary | ICD-10-CM | POA: Diagnosis not present

## 2012-12-06 DIAGNOSIS — IMO0001 Reserved for inherently not codable concepts without codable children: Secondary | ICD-10-CM | POA: Diagnosis not present

## 2012-12-06 DIAGNOSIS — J45909 Unspecified asthma, uncomplicated: Secondary | ICD-10-CM | POA: Diagnosis not present

## 2013-02-05 ENCOUNTER — Other Ambulatory Visit: Payer: Self-pay | Admitting: Neurology

## 2013-02-06 MED ORDER — LEVETIRACETAM 1000 MG PO TABS: ORAL_TABLET | ORAL | Status: DC

## 2013-02-25 ENCOUNTER — Emergency Department (HOSPITAL_COMMUNITY)
Admission: EM | Admit: 2013-02-25 | Discharge: 2013-02-25 | Disposition: A | Discharge status: Home / Self Care | Payer: Medicare Other | Attending: Emergency Medicine | Admitting: Emergency Medicine

## 2013-02-25 ENCOUNTER — Encounter (HOSPITAL_COMMUNITY): Payer: Self-pay | Admitting: Emergency Medicine

## 2013-02-25 DIAGNOSIS — E860 Dehydration: Secondary | ICD-10-CM | POA: Diagnosis not present

## 2013-02-25 DIAGNOSIS — Z880 Allergy status to penicillin: Secondary | ICD-10-CM | POA: Diagnosis not present

## 2013-02-25 DIAGNOSIS — Z8659 Personal history of other mental and behavioral disorders: Secondary | ICD-10-CM | POA: Insufficient documentation

## 2013-02-25 DIAGNOSIS — R000 Tachycardia, unspecified: Secondary | ICD-10-CM | POA: Diagnosis not present

## 2013-02-25 DIAGNOSIS — R112 Nausea with vomiting, unspecified: Secondary | ICD-10-CM | POA: Diagnosis not present

## 2013-02-25 DIAGNOSIS — Z794 Long term (current) use of insulin: Secondary | ICD-10-CM | POA: Insufficient documentation

## 2013-02-25 DIAGNOSIS — G40909 Epilepsy, unspecified, not intractable, without status epilepticus: Secondary | ICD-10-CM | POA: Insufficient documentation

## 2013-02-25 DIAGNOSIS — E119 Type 2 diabetes mellitus without complications: Secondary | ICD-10-CM | POA: Insufficient documentation

## 2013-02-25 DIAGNOSIS — IMO0002 Reserved for concepts with insufficient information to code with codable children: Secondary | ICD-10-CM | POA: Diagnosis not present

## 2013-02-25 DIAGNOSIS — R739 Hyperglycemia, unspecified: Secondary | ICD-10-CM

## 2013-02-25 DIAGNOSIS — R197 Diarrhea, unspecified: Secondary | ICD-10-CM

## 2013-02-25 DIAGNOSIS — Z79899 Other long term (current) drug therapy: Secondary | ICD-10-CM | POA: Diagnosis not present

## 2013-02-25 DIAGNOSIS — R1110 Vomiting, unspecified: Secondary | ICD-10-CM | POA: Insufficient documentation

## 2013-02-25 LAB — COMPREHENSIVE METABOLIC PANEL
ALBUMIN: 3.5 g/dL (ref 3.5–5.2)
ALT: 25 U/L (ref 0–35)
AST: 26 U/L (ref 0–37)
Alkaline Phosphatase: 71 U/L (ref 39–117)
BILIRUBIN TOTAL: 0.5 mg/dL (ref 0.3–1.2)
BUN: 12 mg/dL (ref 6–23)
CHLORIDE: 92 meq/L — AB (ref 96–112)
CO2: 24 mEq/L (ref 19–32)
CREATININE: 0.49 mg/dL — AB (ref 0.50–1.10)
Calcium: 8.1 mg/dL — ABNORMAL LOW (ref 8.4–10.5)
GFR calc Af Amer: >90 mL/min (ref >90)
GFR calc non Af Amer: >90 mL/min (ref >90)
Glucose, Bld: 384 mg/dL — ABNORMAL HIGH (ref 70–99)
POTASSIUM: 3.4 meq/L — AB (ref 3.7–5.3)
Sodium: 132 mEq/L — ABNORMAL LOW (ref 137–147)
Total Protein: 7.5 g/dL (ref 6.0–8.3)

## 2013-02-25 LAB — CBC WITH DIFFERENTIAL/PLATELET
BASOS ABS: 0 10*3/uL (ref 0.0–0.1)
BASOS PCT: 0 % (ref 0–1)
Eosinophils Absolute: 0 10*3/uL (ref 0.0–0.7)
Eosinophils Relative: 0 % (ref 0–5)
HCT: 40.1 % (ref 36.0–46.0)
Hemoglobin: 13.4 g/dL (ref 12.0–15.0)
Lymphocytes Relative: 18 % (ref 12–46)
Lymphs Abs: 0.7 10*3/uL (ref 0.7–4.0)
MCH: 26.5 pg (ref 26.0–34.0)
MCHC: 33.4 g/dL (ref 30.0–36.0)
MCV: 79.2 fL (ref 78.0–100.0)
MONO ABS: 0.3 10*3/uL (ref 0.1–1.0)
Monocytes Relative: 7 % (ref 3–12)
NEUTROS ABS: 2.7 10*3/uL (ref 1.7–7.7)
NEUTROS PCT: 74 % (ref 43–77)
Platelets: 142 10*3/uL — ABNORMAL LOW (ref 150–400)
RBC: 5.06 MIL/uL (ref 3.87–5.11)
RDW: 14 % (ref 11.5–15.5)
WBC: 3.7 10*3/uL — ABNORMAL LOW (ref 4.0–10.5)

## 2013-02-25 LAB — URINALYSIS, ROUTINE W REFLEX MICROSCOPIC
Bilirubin Urine: NEGATIVE
Hgb urine dipstick: NEGATIVE
KETONES UR: NEGATIVE mg/dL
Nitrite: NEGATIVE
PH: 6 (ref 5.0–8.0)
Protein, ur: NEGATIVE mg/dL
SPECIFIC GRAVITY, URINE: 1.018 (ref 1.005–1.030)
Urobilinogen, UA: 0.2 mg/dL (ref 0.0–1.0)

## 2013-02-25 LAB — POCT I-STAT, CHEM 8
BUN: 7 mg/dL (ref 6–23)
Calcium, Ion: 1.03 mmol/L — ABNORMAL LOW (ref 1.12–1.23)
Chloride: 98 mEq/L (ref 96–112)
Creatinine, Ser: 0.6 mg/dL (ref 0.50–1.10)
Glucose, Bld: 271 mg/dL — ABNORMAL HIGH (ref 70–99)
HEMATOCRIT: 43 % (ref 36.0–46.0)
HEMOGLOBIN: 14.6 g/dL (ref 12.0–15.0)
POTASSIUM: 3.4 meq/L — AB (ref 3.7–5.3)
SODIUM: 139 meq/L (ref 137–147)
TCO2: 26 mmol/L (ref 0–100)

## 2013-02-25 LAB — URINE MICROSCOPIC-ADD ON

## 2013-02-25 LAB — GLUCOSE, CAPILLARY: GLUCOSE-CAPILLARY: 346 mg/dL — AB (ref 70–99)

## 2013-02-25 LAB — LIPASE, BLOOD: Lipase: 20 U/L (ref 11–59)

## 2013-02-25 MED ORDER — ONDANSETRON HCL 4 MG/2ML IJ SOLN
4.0000 mg | Freq: Once | INTRAMUSCULAR | Status: AC
  Administered 2013-02-25: 4 mg via INTRAVENOUS
  Filled 2013-02-25: qty 2

## 2013-02-25 MED ORDER — PROMETHAZINE HCL 25 MG PO TABS: 25.0000 mg | ORAL_TABLET | Freq: Four times a day (QID) | ORAL | Status: DC | PRN

## 2013-02-25 MED ORDER — SODIUM CHLORIDE 0.9 % IV BOLUS (SEPSIS)
2000.0000 mL | Freq: Once | INTRAVENOUS | Status: AC
  Administered 2013-02-25: 2000 mL via INTRAVENOUS

## 2013-02-25 NOTE — ED Notes (Signed)
Pt from home reports N/V/D x2 days. Pt reports inability to keep fluids, food or meds down. Pt A&O and in NAD

## 2013-02-25 NOTE — ED Provider Notes (Signed)
CSN: [id number]     Arrival date & time 02/25/13  1020 History   First MD Initiated Contact with Patient 02/25/13 1041     Chief Complaint  Patient presents with  . Emesis  . Diarrhea   (Consider location/radiation/quality/duration/timing/severity/associated sxs/prior Treatment) The history is provided by the patient.  Tammy Arellano is a 48 y.o. female hx of type 2 diabetes, here with vomiting. Vomiting since 5 PM yesterday. Vomiting numerous times but nonbilious nonbilious. Unable to keep any food or fluids down. Also several episodes of diarrhea. Family had similar symptoms but not as severe. Some subjective fevers yesterday but no fever here. Denies being pregnant.    Past Medical History  Diagnosis Date  . Seizures   . Diabetes mellitus   . Mental retardation   . Pseudoseizures    Past Surgical History  Procedure Laterality Date  . No past surgeries     Family History  Problem Relation Age of Onset  . Osteoarthritis Mother   . Diabetes Mother   . Cancer Father    History  Substance Use Topics  . Smoking status: Never Smoker   . Smokeless tobacco: Never Used  . Alcohol Use: No   OB History   Grav Para Term Preterm Abortions TAB SAB Ect Mult Living                 Review of Systems  Gastrointestinal: Positive for vomiting and diarrhea.  All other systems reviewed and are negative.    Allergies  Furosemide; Shellfish allergy; Dilantin; Lip balm; and Penicillins  Home Medications   Current Outpatient Rx  Name  Route  Sig  Dispense  Refill  . albuterol (PROVENTIL HFA;VENTOLIN HFA) 108 (90 BASE) MCG/ACT inhaler   Inhalation   Inhale 2 puffs into the lungs every 6 (six) hours as needed.         Marland Kitchen amLODipine (NORVASC) 5 MG tablet   Oral   Take 5 mg by mouth every morning.         . beclomethasone (QVAR) 80 MCG/ACT inhaler   Inhalation   Inhale 2 puffs into the lungs 2 (two) times daily.         Marland Kitchen glucagon 1 MG injection   Intravenous   Inject 1  mg into the vein once as needed (low blood sugar and not able to drink juice).   1 each   12   . insulin aspart (NOVOLOG FLEXPEN) 100 UNIT/ML FlexPen   Subcutaneous   Inject 0-9 Units into the skin 3 (three) times daily with meals. Sliding scale         . insulin detemir (LEVEMIR) 100 unit/ml SOLN   Subcutaneous   Inject 23 Units into the skin every morning.         Marland Kitchen LAMICTAL 200 MG tablet      TAKE 1 TABLET BY MOUTH TWICE DAILY   180 tablet   3     Dispense as written.   . levETIRAcetam (KEPPRA) 500 MG tablet   Oral   Take 1,500-2,000 mg by mouth 2 (two) times daily. Take 3 tablets in the morning, take 4 tablets in the evening         . loratadine (CLARITIN) 10 MG tablet   Oral   Take 10 mg by mouth daily.         . montelukast (SINGULAIR) 10 MG tablet   Oral   Take 10 mg by mouth at bedtime.         Marland Kitchen  Multiple Vitamin (MULTIVITAMIN WITH MINERALS) TABS   Oral   Take 1 tablet by mouth daily.         Marland Kitchen omeprazole (PRILOSEC) 20 MG capsule   Oral   Take 20 mg by mouth daily.         . pioglitazone-metformin (ACTOPLUS MET) 15-850 MG per tablet   Oral   Take 1 tablet by mouth daily with breakfast.         . promethazine (PHENERGAN) 12.5 MG tablet   Oral   Take 12.5 mg by mouth every 6 (six) hours as needed for nausea or vomiting.         . vitamin C (ASCORBIC ACID) 500 MG tablet   Oral   Take 500 mg by mouth daily.         . vitamin E 400 UNIT capsule   Oral   Take 400 Units by mouth daily.          BP 129/85  Pulse 94  Temp(Src) 98.6 F (37 C) (Oral)  Resp 18  SpO2 98% Physical Exam  Nursing note and vitals reviewed. Constitutional: She is oriented to person, place, and time.  Uncomfortable, vomiting   HENT:  Head: Normocephalic.  MM dry   Eyes: Conjunctivae are normal. Pupils are equal, round, and reactive to light.  Neck: Normal range of motion. Neck supple.  Cardiovascular: Regular rhythm and normal heart sounds.    Slightly tachy   Pulmonary/Chest: Effort normal and breath sounds normal. No respiratory distress. She has no wheezes. She has no rales.  Abdominal: Soft. Bowel sounds are normal. She exhibits no distension. There is no tenderness. There is no rebound and no guarding.  Musculoskeletal: Normal range of motion.  Neurological: She is alert and oriented to person, place, and time.  Skin: Skin is warm and dry.  Psychiatric: She has a normal mood and affect. Her behavior is normal. Judgment and thought content normal.    ED Course  Procedures (including critical care time) Labs Review Labs Reviewed  CBC WITH DIFFERENTIAL - Abnormal; Notable for the following:    WBC 3.7 (*)    Platelets 142 (*)    All other components within normal limits  COMPREHENSIVE METABOLIC PANEL - Abnormal; Notable for the following:    Sodium 132 (*)    Potassium 3.4 (*)    Chloride 92 (*)    Glucose, Bld 384 (*)    Creatinine, Ser 0.49 (*)    Calcium 8.1 (*)    All other components within normal limits  URINALYSIS, ROUTINE W REFLEX MICROSCOPIC - Abnormal; Notable for the following:    Glucose, UA >1000 (*)    Leukocytes, UA MODERATE (*)    All other components within normal limits  GLUCOSE, CAPILLARY - Abnormal; Notable for the following:    Glucose-Capillary 346 (*)    All other components within normal limits  POCT I-STAT, CHEM 8 - Abnormal; Notable for the following:    Potassium 3.4 (*)    Glucose, Bld 271 (*)    Calcium, Ion 1.03 (*)    All other components within normal limits  LIPASE, BLOOD  URINE MICROSCOPIC-ADD ON   Imaging Review No results found.  EKG Interpretation   None       MDM   1. Vomiting   2. Diarrhea   3. Hyperglycemia   4. Dehydration    Tammy Arellano is a 48 y.o. female here with vomiting. Likely gastroenteritis. She had similar symptoms previously and  was admitted for DKA. Will get labs to r/o DKA. Will hydrate and give zofran and reassess. Abdomen nontender so I  doubt SBO.   3:06 PM CBG elevated, AG of 16. After 2L NS, repeat istat chem 8 showed glucose 271 and AG 15. She tolerated fluids and felt better. I don't think she has DKA, likely hyperglycemia from dehydration. UA mod leuk but minimal WBC count and she is asymptomatic so will not treat. Will d/c home on prn phenergan.     Wandra Arthurs, MD 02/25/13 [phone]

## 2013-02-25 NOTE — Discharge Instructions (Signed)
Stay hydrated.   Take phenergan for nausea.   Follow up with your doctor.   Return to ER if you have severe pain, vomiting, worse dehydration.

## 2013-02-25 NOTE — ED Notes (Signed)
She is resting comfortably in the presence of her mother. 

## 2013-02-25 NOTE — ED Notes (Signed)
She tells me that since 0500 yesterday, she has had several episodes of diarrhea and a few episodes of emesis.  She is in no distress; and her mucous membranes are moist.  Her mother is with her.  Her abd. Is soft and diffusely, mildly tender to palpation.

## 2013-02-25 NOTE — ED Notes (Signed)
She says she is feeling better.  She ambulates to b.r. Without difficulty.  She has had no vomiting since receiving Zofran.

## 2013-02-28 DIAGNOSIS — R1110 Vomiting, unspecified: Secondary | ICD-10-CM | POA: Diagnosis not present

## 2013-02-28 DIAGNOSIS — E119 Type 2 diabetes mellitus without complications: Secondary | ICD-10-CM | POA: Diagnosis not present

## 2013-02-28 DIAGNOSIS — R197 Diarrhea, unspecified: Secondary | ICD-10-CM | POA: Diagnosis not present

## 2013-03-02 DIAGNOSIS — IMO0001 Reserved for inherently not codable concepts without codable children: Secondary | ICD-10-CM | POA: Diagnosis not present

## 2013-03-02 DIAGNOSIS — N390 Urinary tract infection, site not specified: Secondary | ICD-10-CM | POA: Diagnosis not present

## 2013-03-08 ENCOUNTER — Other Ambulatory Visit: Payer: Self-pay | Admitting: Neurology

## 2013-03-08 DIAGNOSIS — IMO0001 Reserved for inherently not codable concepts without codable children: Secondary | ICD-10-CM | POA: Diagnosis not present

## 2013-03-08 DIAGNOSIS — Z9119 Patient's noncompliance with other medical treatment and regimen: Secondary | ICD-10-CM | POA: Diagnosis not present

## 2013-03-08 DIAGNOSIS — Z91199 Patient's noncompliance with other medical treatment and regimen due to unspecified reason: Secondary | ICD-10-CM | POA: Diagnosis not present

## 2013-03-08 MED ORDER — LEVETIRACETAM 1000 MG PO TABS: ORAL_TABLET | ORAL | Status: DC

## 2013-03-13 ENCOUNTER — Other Ambulatory Visit: Payer: Self-pay

## 2013-03-13 MED ORDER — LEVETIRACETAM 1000 MG PO TABS: ORAL_TABLET | ORAL | Status: DC

## 2013-03-28 DIAGNOSIS — IMO0001 Reserved for inherently not codable concepts without codable children: Secondary | ICD-10-CM | POA: Diagnosis not present

## 2013-03-28 DIAGNOSIS — H6120 Impacted cerumen, unspecified ear: Secondary | ICD-10-CM | POA: Diagnosis not present

## 2013-05-04 ENCOUNTER — Other Ambulatory Visit: Payer: Self-pay | Admitting: Neurology

## 2013-05-15 ENCOUNTER — Telehealth: Payer: Self-pay | Admitting: *Deleted

## 2013-05-15 MED ORDER — LAMICTAL 200 MG PO TABS: 200.0000 mg | ORAL_TABLET | Freq: Two times a day (BID) | ORAL | Status: DC

## 2013-05-15 MED ORDER — KEPPRA 1000 MG PO TABS: ORAL_TABLET | ORAL | Status: DC

## 2013-05-15 NOTE — Telephone Encounter (Signed)
Rx has been sent  

## 2013-06-06 DIAGNOSIS — IMO0001 Reserved for inherently not codable concepts without codable children: Secondary | ICD-10-CM | POA: Diagnosis not present

## 2013-06-06 DIAGNOSIS — Z91199 Patient's noncompliance with other medical treatment and regimen due to unspecified reason: Secondary | ICD-10-CM | POA: Diagnosis not present

## 2013-06-06 DIAGNOSIS — Z9119 Patient's noncompliance with other medical treatment and regimen: Secondary | ICD-10-CM | POA: Diagnosis not present

## 2013-06-27 ENCOUNTER — Telehealth: Payer: Self-pay

## 2013-06-27 NOTE — Telephone Encounter (Signed)
Called to offer earlier appt (on waitlist). Patients mother stated they are on vacation in [place]. Will not return until after 07/10/13.

## 2013-08-03 ENCOUNTER — Telehealth: Payer: Self-pay | Admitting: *Deleted

## 2013-08-03 NOTE — Telephone Encounter (Signed)
Called patient and spoke with patient's mother, patient is on the wait list for an appointment, patient was scheduled with NP MM on 08/08/13 at 3:30 pm, patient was last seen by Dr Anne HahnWillis on 12/02/2011 per centricity.

## 2013-08-07 ENCOUNTER — Encounter: Payer: Self-pay | Admitting: *Deleted

## 2013-08-08 ENCOUNTER — Ambulatory Visit: Payer: Self-pay | Admitting: Adult Health

## 2013-08-30 DIAGNOSIS — I10 Essential (primary) hypertension: Secondary | ICD-10-CM | POA: Diagnosis not present

## 2013-08-30 DIAGNOSIS — IMO0001 Reserved for inherently not codable concepts without codable children: Secondary | ICD-10-CM | POA: Diagnosis not present

## 2013-09-06 DIAGNOSIS — I10 Essential (primary) hypertension: Secondary | ICD-10-CM | POA: Diagnosis not present

## 2013-09-06 DIAGNOSIS — K219 Gastro-esophageal reflux disease without esophagitis: Secondary | ICD-10-CM | POA: Diagnosis not present

## 2013-09-06 DIAGNOSIS — J45909 Unspecified asthma, uncomplicated: Secondary | ICD-10-CM | POA: Diagnosis not present

## 2013-09-06 DIAGNOSIS — E1129 Type 2 diabetes mellitus with other diabetic kidney complication: Secondary | ICD-10-CM | POA: Diagnosis not present

## 2013-09-24 DIAGNOSIS — E119 Type 2 diabetes mellitus without complications: Secondary | ICD-10-CM | POA: Diagnosis not present

## 2013-09-24 DIAGNOSIS — Z794 Long term (current) use of insulin: Secondary | ICD-10-CM | POA: Diagnosis not present

## 2013-09-28 ENCOUNTER — Ambulatory Visit (INDEPENDENT_AMBULATORY_CARE_PROVIDER_SITE_OTHER): Payer: Medicare Other | Admitting: Neurology

## 2013-09-28 ENCOUNTER — Encounter: Payer: Self-pay | Admitting: Neurology

## 2013-09-28 VITALS — BP 132/86 | HR 96 | Ht 63.5 in | Wt 239.0 lb

## 2013-09-28 DIAGNOSIS — Z5181 Encounter for therapeutic drug level monitoring: Secondary | ICD-10-CM

## 2013-09-28 DIAGNOSIS — G40909 Epilepsy, unspecified, not intractable, without status epilepticus: Secondary | ICD-10-CM

## 2013-09-28 NOTE — Progress Notes (Signed)
Reason for visit: Seizures  Tammy Arellano is an 48 y.o. female  History of present illness:  Tammy Arellano is a 48 year old left-handed black female with a history of seizures and diabetes. The patient has done relatively well since last seen in November 2013. Her last seizure was about 6 months ago, and the patient indicates that she will have an occasional seizure if she misses a dose of her medications. The patient is on Keppra and Lamictal. She is tolerating the medications well. She does not operate a motor vehicle. The patient denies any new significant medical issues that have come up in. The patient returns to this office for an evaluation.  Past Medical History  Diagnosis Date  . Seizures   . Diabetes mellitus   . Mental retardation   . Pseudoseizures   . Obesity   . Hypertension   . Dyslipidemia   . Asthma     Past Surgical History  Procedure Laterality Date  . Breast reduction surgery    . Breast cyst resection      Family History  Problem Relation Age of Onset  . Osteoarthritis Mother   . Diabetes Mother   . Hypertension Mother   . Clotting disorder Mother     blood disorder  . Cancer Father     Lung  . Seizures Father   . Cancer Maternal Grandmother   . Heart attack Paternal Grandmother     Social history:  reports that she has never smoked. She has never used smokeless tobacco. She reports that she does not drink alcohol or use illicit drugs.    Allergies  Allergen Reactions  . Dilantin [Phenytoin Sodium Extended] Anaphylaxis    Induces seizures   . Furosemide Anaphylaxis  . Shellfish Allergy Anaphylaxis  . Depakote [Divalproex Sodium] Other (See Comments)    Leukopenia and weight gain  . Lip Balm [Chapstick] Other (See Comments)    Lips swell only chapstick brand  . Penicillins Nausea And Vomiting  . Latex Rash    Medications:  Current Outpatient Prescriptions on File Prior to Visit  Medication Sig Dispense Refill  . albuterol  (PROVENTIL HFA;VENTOLIN HFA) 108 (90 BASE) MCG/ACT inhaler Inhale 2 puffs into the lungs every 6 (six) hours as needed.      Marland Kitchen amLODipine (NORVASC) 5 MG tablet Take 5 mg by mouth every morning.      . beclomethasone (QVAR) 80 MCG/ACT inhaler Inhale 2 puffs into the lungs 2 (two) times daily.      . Calcium Carbonate-Vitamin D (CALTRATE 600+D) 600-400 MG-UNIT per tablet Take 1 tablet by mouth daily.      . ferrous fumarate (HEMOCYTE - 106 MG FE) 325 (106 FE) MG TABS tablet Take 1 tablet by mouth.      . fexofenadine (ALLEGRA) 180 MG tablet Take 180 mg by mouth daily.      Marland Kitchen glucagon 1 MG injection Inject 1 mg into the vein once as needed (low blood sugar and not able to drink juice).  1 each  12  . insulin aspart (NOVOLOG FLEXPEN) 100 UNIT/ML FlexPen Inject 0-9 Units into the skin 3 (three) times daily with meals. Sliding scale      . insulin detemir (LEVEMIR) 100 unit/ml SOLN Inject 23 Units into the skin every morning.      . insulin glargine (LANTUS) 100 UNIT/ML injection Inject 100 Units into the skin at bedtime.      . insulin NPH-insulin regular (HUMULIN 50/50) (50-50) 100 UNIT/ML injection  Inject 100 Units into the skin 2 (two) times daily before a meal.      . KEPPRA 1000 MG tablet Take one and one half tablets each morning and take two tablets each evening  315 tablet  1  . LAMICTAL 200 MG tablet Take 1 tablet (200 mg total) by mouth 2 (two) times daily.  180 tablet  1  . loratadine (CLARITIN) 10 MG tablet Take 10 mg by mouth daily.      . metFORMIN (GLUCOPHAGE) 850 MG tablet Take 850 mg by mouth 2 (two) times daily with a meal.      . montelukast (SINGULAIR) 10 MG tablet Take 10 mg by mouth at bedtime.      . Multiple Vitamin (MULTIVITAMIN WITH MINERALS) TABS Take 1 tablet by mouth daily.      Marland Kitchen omeprazole (PRILOSEC) 20 MG capsule Take 20 mg by mouth daily.      . pioglitazone-metformin (ACTOPLUS MET) 15-850 MG per tablet Take 1 tablet by mouth daily with breakfast.      . promethazine  (PHENERGAN) 12.5 MG tablet Take 12.5 mg by mouth every 6 (six) hours as needed for nausea or vomiting.      . promethazine (PHENERGAN) 25 MG tablet Take 1 tablet (25 mg total) by mouth every 6 (six) hours as needed for nausea or vomiting.  15 tablet  0  . vitamin C (ASCORBIC ACID) 500 MG tablet Take 500 mg by mouth daily.      . vitamin E 400 UNIT capsule Take 400 Units by mouth daily.       No current facility-administered medications on file prior to visit.    ROS:  Out of a complete 14 system review of symptoms, the patient complains only of the following symptoms, and all other reviewed systems are negative.  Cough Excessive thirst, excessive bleeding Environmental allergies Seizures  Blood pressure 132/86, pulse 96, height 5' 3.5" (1.613 m), weight 239 lb (108.41 kg).  Physical Exam  General: The patient is alert and cooperative at the time of the examination. The patient is moderately to markedly obese.  Skin: No significant peripheral edema is noted.   Neurologic Exam  Mental status: The patient is oriented x 3.  Cranial nerves: Facial symmetry is present. Speech is normal, no aphasia or dysarthria is noted. Extraocular movements are full. Visual fields are full. There appears to be frontal bossing.  Motor: The patient has good strength in all 4 extremities.  Sensory examination: Soft touch sensation is symmetric on the face, arms, and legs.  Coordination: The patient has good finger-nose-finger and heel-to-shin bilaterally.  Gait and station: The patient has a normal gait. Tandem gait is normal. Romberg is negative. No drift is seen.  Reflexes: Deep tendon reflexes are symmetric.   Assessment/Plan:  1. History of seizures  2. Diabetes  3. Obesity  The patient is tolerating her medications well at this point. She will continue the Lamictal and Keppra at the current dose. She will followup in one year. Blood work will be done today. She will contact our office  if any new issues arise. She does not operate a motor vehicle.  Jill Alexanders MD 09/29/2013 9:46 AM  [facility] [address] [place], [place] [address]  Phone [phone] Fax [phone]

## 2013-09-28 NOTE — Patient Instructions (Signed)

## 2013-10-04 ENCOUNTER — Telehealth: Payer: Self-pay | Admitting: Neurology

## 2013-10-04 LAB — COMPREHENSIVE METABOLIC PANEL
ALT: 17 IU/L (ref 0–32)
AST: 16 IU/L (ref 0–40)
Albumin/Globulin Ratio: 1.4 (ref 1.1–2.5)
Albumin: 4.3 g/dL (ref 3.5–5.5)
Alkaline Phosphatase: 83 IU/L (ref 39–117)
BUN/Creatinine Ratio: 11 (ref 9–23)
BUN: 7 mg/dL (ref 6–24)
CO2: 28 mmol/L (ref 18–29)
Calcium: 9.1 mg/dL (ref 8.7–10.2)
Chloride: 93 mmol/L — ABNORMAL LOW (ref 97–108)
Creatinine, Ser: 0.63 mg/dL (ref 0.57–1.00)
GFR calc Af Amer: 125 mL/min/{1.73_m2} (ref >59)
GFR calc non Af Amer: 109 mL/min/{1.73_m2} (ref >59)
Globulin, Total: 3.1 g/dL (ref 1.5–4.5)
Glucose: 456 mg/dL — ABNORMAL HIGH (ref 65–99)
Potassium: 4.2 mmol/L (ref 3.5–5.2)
Sodium: 136 mmol/L (ref 134–144)
Total Bilirubin: 0.5 mg/dL (ref 0.0–1.2)
Total Protein: 7.4 g/dL (ref 6.0–8.5)

## 2013-10-04 LAB — CBC WITH DIFFERENTIAL
BASOS ABS: 0 10*3/uL (ref 0.0–0.2)
Basos: 1 %
Eos: 2 %
Eosinophils Absolute: 0.1 10*3/uL (ref 0.0–0.4)
HCT: 41.6 % (ref 34.0–46.6)
Hemoglobin: 12.7 g/dL (ref 11.1–15.9)
IMMATURE GRANULOCYTES: 0 %
Immature Grans (Abs): 0 10*3/uL (ref 0.0–0.1)
Lymphocytes Absolute: 1.2 10*3/uL (ref 0.7–3.1)
Lymphs: 40 %
MCH: 25.9 pg — AB (ref 26.6–33.0)
MCHC: 30.5 g/dL — ABNORMAL LOW (ref 31.5–35.7)
MCV: 85 fL (ref 79–97)
MONOS ABS: 0.2 10*3/uL (ref 0.1–0.9)
Monocytes: 8 %
NEUTROS PCT: 49 %
Neutrophils Absolute: 1.5 10*3/uL (ref 1.4–7.0)
PLATELETS: 178 10*3/uL (ref 150–379)
RBC: 4.9 x10E6/uL (ref 3.77–5.28)
RDW: 14.3 % (ref 12.3–15.4)
WBC: 3 10*3/uL — ABNORMAL LOW (ref 3.4–10.8)

## 2013-10-04 LAB — LAMOTRIGINE LEVEL: Lamotrigine Lvl: 5.7 ug/mL (ref 2.0–20.0)

## 2013-10-04 NOTE — Telephone Encounter (Signed)
I called the patient, talked with the mother. The blood work shows a slightly low white blood count of 3.0, and the blood sugar was fairly high at 456. I'll send blood work to her primary care doctor, Dr. time. We will recheck the CBC in 6-8 weeks.

## 2013-10-29 DIAGNOSIS — Z23 Encounter for immunization: Secondary | ICD-10-CM | POA: Diagnosis not present

## 2013-11-06 ENCOUNTER — Other Ambulatory Visit: Payer: Self-pay

## 2013-11-06 MED ORDER — LAMICTAL 200 MG PO TABS: 200.0000 mg | ORAL_TABLET | Freq: Two times a day (BID) | ORAL | Status: DC

## 2013-11-06 MED ORDER — KEPPRA 1000 MG PO TABS: ORAL_TABLET | ORAL | Status: DC

## 2013-11-29 ENCOUNTER — Telehealth: Payer: Self-pay | Admitting: Neurology

## 2013-11-29 DIAGNOSIS — Z5181 Encounter for therapeutic drug level monitoring: Secondary | ICD-10-CM

## 2013-11-29 NOTE — Telephone Encounter (Signed)
I called the patient, the patient is to come in and get the CBC drawn to evaluate for the low white blood count seen previously.

## 2013-12-06 ENCOUNTER — Other Ambulatory Visit: Payer: Medicare Other

## 2013-12-06 DIAGNOSIS — Z5181 Encounter for therapeutic drug level monitoring: Secondary | ICD-10-CM | POA: Diagnosis not present

## 2013-12-07 LAB — CBC WITH DIFFERENTIAL
BASOS ABS: 0 10*3/uL (ref 0.0–0.2)
Basos: 1 %
Eos: 2 %
Eosinophils Absolute: 0.1 10*3/uL (ref 0.0–0.4)
HCT: 41.6 % (ref 34.0–46.6)
Hemoglobin: 13.2 g/dL (ref 11.1–15.9)
IMMATURE GRANS (ABS): 0 10*3/uL (ref 0.0–0.1)
IMMATURE GRANULOCYTES: 0 %
Lymphocytes Absolute: 1.7 10*3/uL (ref 0.7–3.1)
Lymphs: 46 %
MCH: 26.3 pg — AB (ref 26.6–33.0)
MCHC: 31.7 g/dL (ref 31.5–35.7)
MCV: 83 fL (ref 79–97)
Monocytes Absolute: 0.4 10*3/uL (ref 0.1–0.9)
Monocytes: 9 %
Neutrophils Absolute: 1.6 10*3/uL (ref 1.4–7.0)
Neutrophils Relative %: 42 %
PLATELETS: 196 10*3/uL (ref 150–379)
RBC: 5.01 x10E6/uL (ref 3.77–5.28)
RDW: 14.3 % (ref 12.3–15.4)
WBC: 3.8 10*3/uL (ref 3.4–10.8)

## 2014-03-01 ENCOUNTER — Other Ambulatory Visit: Payer: Self-pay | Admitting: Neurology

## 2014-03-04 ENCOUNTER — Other Ambulatory Visit: Payer: Self-pay | Admitting: Neurology

## 2014-03-05 NOTE — Telephone Encounter (Signed)
Rx's were already sent on 02/07.  [facility]

## 2014-03-13 DIAGNOSIS — T7840XA Allergy, unspecified, initial encounter: Secondary | ICD-10-CM | POA: Diagnosis not present

## 2014-05-08 ENCOUNTER — Other Ambulatory Visit: Payer: Self-pay

## 2014-05-08 DIAGNOSIS — Z1231 Encounter for screening mammogram for malignant neoplasm of breast: Secondary | ICD-10-CM

## 2014-05-14 DIAGNOSIS — I10 Essential (primary) hypertension: Secondary | ICD-10-CM | POA: Diagnosis not present

## 2014-05-14 DIAGNOSIS — E1129 Type 2 diabetes mellitus with other diabetic kidney complication: Secondary | ICD-10-CM | POA: Diagnosis not present

## 2014-05-16 ENCOUNTER — Ambulatory Visit
Admission: RE | Admit: 2014-05-16 | Discharge: 2014-05-16 | Disposition: A | Discharge status: Home / Self Care | Payer: Medicare Other | Source: Ambulatory Visit

## 2014-05-16 DIAGNOSIS — Z1231 Encounter for screening mammogram for malignant neoplasm of breast: Secondary | ICD-10-CM | POA: Diagnosis not present

## 2014-05-21 ENCOUNTER — Emergency Department (HOSPITAL_COMMUNITY)
Admission: EM | Admit: 2014-05-21 | Discharge: 2014-05-22 | Disposition: A | Discharge status: Home / Self Care | Payer: No Typology Code available for payment source | Attending: Emergency Medicine | Admitting: Emergency Medicine

## 2014-05-21 DIAGNOSIS — Z9989 Dependence on other enabling machines and devices: Secondary | ICD-10-CM | POA: Diagnosis not present

## 2014-05-21 DIAGNOSIS — Z91040 Latex allergy status: Secondary | ICD-10-CM | POA: Insufficient documentation

## 2014-05-21 DIAGNOSIS — I10 Essential (primary) hypertension: Secondary | ICD-10-CM | POA: Insufficient documentation

## 2014-05-21 DIAGNOSIS — R569 Unspecified convulsions: Secondary | ICD-10-CM | POA: Diagnosis present

## 2014-05-21 DIAGNOSIS — Z7951 Long term (current) use of inhaled steroids: Secondary | ICD-10-CM | POA: Diagnosis not present

## 2014-05-21 DIAGNOSIS — Z794 Long term (current) use of insulin: Secondary | ICD-10-CM | POA: Diagnosis not present

## 2014-05-21 DIAGNOSIS — E669 Obesity, unspecified: Secondary | ICD-10-CM | POA: Diagnosis not present

## 2014-05-21 DIAGNOSIS — G40909 Epilepsy, unspecified, not intractable, without status epilepticus: Secondary | ICD-10-CM | POA: Insufficient documentation

## 2014-05-21 DIAGNOSIS — J45909 Unspecified asthma, uncomplicated: Secondary | ICD-10-CM | POA: Diagnosis not present

## 2014-05-21 DIAGNOSIS — M549 Dorsalgia, unspecified: Secondary | ICD-10-CM | POA: Diagnosis not present

## 2014-05-21 DIAGNOSIS — Z880 Allergy status to penicillin: Secondary | ICD-10-CM | POA: Diagnosis not present

## 2014-05-21 DIAGNOSIS — Y9389 Activity, other specified: Secondary | ICD-10-CM | POA: Insufficient documentation

## 2014-05-21 DIAGNOSIS — Z043 Encounter for examination and observation following other accident: Secondary | ICD-10-CM | POA: Diagnosis not present

## 2014-05-21 DIAGNOSIS — E119 Type 2 diabetes mellitus without complications: Secondary | ICD-10-CM | POA: Diagnosis not present

## 2014-05-21 DIAGNOSIS — Y92410 Unspecified street and highway as the place of occurrence of the external cause: Secondary | ICD-10-CM | POA: Diagnosis not present

## 2014-05-21 DIAGNOSIS — T148 Other injury of unspecified body region: Secondary | ICD-10-CM | POA: Diagnosis not present

## 2014-05-21 DIAGNOSIS — Y998 Other external cause status: Secondary | ICD-10-CM | POA: Insufficient documentation

## 2014-05-21 MED ORDER — NAPROXEN 500 MG PO TABS: 500.0000 mg | ORAL_TABLET | Freq: Two times a day (BID) | ORAL | Status: DC

## 2014-05-21 MED ORDER — METHOCARBAMOL 500 MG PO TABS: 500.0000 mg | ORAL_TABLET | Freq: Two times a day (BID) | ORAL | Status: DC

## 2014-05-21 MED ORDER — OXYCODONE-ACETAMINOPHEN 5-325 MG PO TABS
2.0000 | ORAL_TABLET | Freq: Once | ORAL | Status: AC
  Administered 2014-05-22: 2 via ORAL
  Filled 2014-05-21: qty 2

## 2014-05-21 MED ORDER — OXYCODONE-ACETAMINOPHEN 5-325 MG PO TABS: 2.0000 | ORAL_TABLET | ORAL | Status: DC | PRN
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
## 2014-05-22 DIAGNOSIS — G40909 Epilepsy, unspecified, not intractable, without status epilepticus: Secondary | ICD-10-CM | POA: Diagnosis not present

## 2014-05-22 DIAGNOSIS — I10 Essential (primary) hypertension: Secondary | ICD-10-CM | POA: Diagnosis not present

## 2014-05-22 DIAGNOSIS — E1165 Type 2 diabetes mellitus with hyperglycemia: Secondary | ICD-10-CM | POA: Diagnosis not present

## 2014-06-19 DIAGNOSIS — I10 Essential (primary) hypertension: Secondary | ICD-10-CM | POA: Diagnosis not present

## 2014-06-19 DIAGNOSIS — E1165 Type 2 diabetes mellitus with hyperglycemia: Secondary | ICD-10-CM | POA: Diagnosis not present

## 2014-06-21 ENCOUNTER — Encounter: Payer: Self-pay | Admitting: Endocrinology

## 2014-06-21 ENCOUNTER — Ambulatory Visit (INDEPENDENT_AMBULATORY_CARE_PROVIDER_SITE_OTHER): Payer: Medicare Other | Admitting: Endocrinology

## 2014-06-21 VITALS — BP 122/78 | HR 86 | Temp 97.7°F | Resp 16 | Ht 63.5 in | Wt 220.4 lb

## 2014-06-21 DIAGNOSIS — E7800 Pure hypercholesterolemia, unspecified: Secondary | ICD-10-CM

## 2014-06-21 DIAGNOSIS — E1165 Type 2 diabetes mellitus with hyperglycemia: Secondary | ICD-10-CM

## 2014-06-21 DIAGNOSIS — IMO0002 Reserved for concepts with insufficient information to code with codable children: Secondary | ICD-10-CM

## 2014-06-21 DIAGNOSIS — I10 Essential (primary) hypertension: Secondary | ICD-10-CM | POA: Diagnosis not present

## 2014-06-21 NOTE — Progress Notes (Signed)
Patient ID: Tammy Arellano, female   DOB: April 11, 1965, 49 y.o.   MRN: [id number]           Reason for Appointment: Consultation for Type 2 Diabetes  Referring physician: Tommy Medal  History of Present Illness:          Date of diagnosis of type 2 diabetes mellitus :        Background history:  Detailed records are not available and the patient is a poor historian Most likely she had been on oral hypoglycemic drugs for several years and this was switched to insulin about 10 years ago Not clear if she ever took any GLP-1 drugs, she does not remember taking Victoza Records available indicate that her A1c in 2015 was 12.1 in May and 9.5, 3 months later Her records indicate that she has been on various insulin regimens in the past including Lantus and 50/50 insulin but most recently has been taking Levemir and Novolog for some time  Recent history:  She is a little confused about how she is taking which kind of insulin and she recognizes the insulin maintenance by the color of the pen rather than the name of the insulin She thinks she is taking Levemir at night once a day although by mistake she took another dose today at lunchtime of 10 units She also think that she is taking Novolog before a meal and usually 10 units regardless of her meal or blood sugar level However it is not clear that she is understanding the differences between the 2 types of insulin She does not like to check her blood sugar and probably check status infrequently Did not bring her monitor for download INSULIN regimen is described as: Levemir Insulin 30 hs  ? Novolog 10 actid      Current blood sugar patterns and problems identified:  Diet: She is not able to follow her diet and admits to drinking occasional regular soft drinks; also likes to eat junk food like candy, chips and sweets  Her blood sugars are probably higher than what she is estimating from her recall as A1c has been recently over 12%  She thinks  her blood sugars are consistently high after supper  Unbalanced breakfast usually with high carbohydrate intake     Oral hypoglycemic drugs the patient is taking are: ?  Actoplusmet once a day.  Patient is unsure about this      Side effects from medications have been: None  Compliance with the medical regimen: Poor Hypoglycemia:   none  Glucose monitoring:  done 0-1 times a day         Glucometer: Accucheck     Blood Glucose readings by recall:   PREMEAL Breakfast Lunch Dinner Bedtime  Overall   Glucose range: 120-210   235   Median:        Self-care: The diet that the patient has been following is: none, may eat junk food     Typical meal intake: Breakfast is cereal, rice cakes, usually 3 meals a day               Dietician visit, most recent: 9/14 Her weight appears to be better than last year               Exercise:  occasional exercise bike at home, generally once a week  Weight history:  Wt Readings from Last 3 Encounters:  06/21/14 220 lb 6.4 oz (99.973 kg)  09/28/13 239 lb (108.41 kg)  09/26/12 229 lb 9.6 oz (104.146 kg)    Glycemic control:   Lab Results  Component Value Date   HGBA1C 12.3* 08/24/2011   Lab Results  Component Value Date   CREATININE 0.63 09/28/2013         Medication List       This list is accurate as of: 06/21/14  2:47 PM.  Always use your most recent med list.               albuterol 108 (90 BASE) MCG/ACT inhaler  Commonly known as:  PROVENTIL HFA;VENTOLIN HFA  Inhale 2 puffs into the lungs every 6 (six) hours as needed.     amLODipine 5 MG tablet  Commonly known as:  NORVASC  Take 5 mg by mouth every morning.     beclomethasone 80 MCG/ACT inhaler  Commonly known as:  QVAR  Inhale 2 puffs into the lungs 2 (two) times daily.     CALTRATE 600+D 600-400 MG-UNIT per tablet  Generic drug:  Calcium Carbonate-Vitamin D  Take 1 tablet by mouth daily.     ferrous fumarate 325 (106 FE) MG Tabs tablet  Commonly known as:   HEMOCYTE - 106 mg FE  Take 1 tablet by mouth.     fexofenadine 180 MG tablet  Commonly known as:  ALLEGRA  Take 180 mg by mouth daily.     glucagon 1 MG injection  Inject 1 mg into the vein once as needed (low blood sugar and not able to drink juice).     insulin detemir 100 unit/ml Soln  Commonly known as:  LEVEMIR  Inject 30 Units into the skin every morning.     LAMICTAL 200 MG tablet  Generic drug:  lamoTRIgine  TAKE 1 TABLET BY MOUTH TWICE DAILY     levETIRAcetam 1000 MG tablet  Commonly known as:  KEPPRA  Take one and one half tablets each morning and take two tablets each evening     loratadine 10 MG tablet  Commonly known as:  CLARITIN  Take 10 mg by mouth daily.     methocarbamol 500 MG tablet  Commonly known as:  ROBAXIN  Take 1 tablet (500 mg total) by mouth 2 (two) times daily.     montelukast 10 MG tablet  Commonly known as:  SINGULAIR  Take 10 mg by mouth at bedtime.     multivitamin with minerals Tabs tablet  Take 1 tablet by mouth daily.     naproxen 500 MG tablet  Commonly known as:  NAPROSYN  Take 1 tablet (500 mg total) by mouth 2 (two) times daily.     NOVOLOG FLEXPEN 100 UNIT/ML FlexPen  Generic drug:  insulin aspart  Inject 0-9 Units into the skin 3 (three) times daily with meals. Sliding scale     omeprazole 20 MG capsule  Commonly known as:  PRILOSEC  Take 20 mg by mouth daily.     oxyCODONE-acetaminophen 5-325 MG per tablet  Commonly known as:  PERCOCET/ROXICET  Take 2 tablets by mouth every 4 (four) hours as needed for severe pain.     pioglitazone-metformin 15-850 MG per tablet  Commonly known as:  ACTOPLUS MET  Take 1 tablet by mouth daily with breakfast.     pravastatin 40 MG tablet  Commonly known as:  PRAVACHOL     promethazine 25 MG tablet  Commonly known as:  PHENERGAN  Take 1 tablet (25 mg total) by mouth every 6 (six) hours as needed for nausea or  vomiting.     promethazine 12.5 MG tablet  Commonly known as:   PHENERGAN  Take 12.5 mg by mouth every 6 (six) hours as needed for nausea or vomiting.     vitamin C 500 MG tablet  Commonly known as:  ASCORBIC ACID  Take 500 mg by mouth daily.     vitamin E 400 UNIT capsule  Take 400 Units by mouth daily.        Allergies:  Allergies  Allergen Reactions  . Dilantin [Phenytoin Sodium Extended] Anaphylaxis    Induces seizures   . Furosemide Anaphylaxis  . Shellfish Allergy Anaphylaxis  . Depakote [Divalproex Sodium] Other (See Comments)    Leukopenia and weight gain  . Lip Balm [Chapstick] Other (See Comments)    Lips swell only chapstick brand  . Penicillins Nausea And Vomiting  . Latex Rash    Past Medical History  Diagnosis Date  . Seizures   . Diabetes mellitus   . Mental retardation   . Pseudoseizures   . Obesity   . Hypertension   . Dyslipidemia   . Asthma     Past Surgical History  Procedure Laterality Date  . Breast reduction surgery    . Breast cyst resection    . Breast surgery      Breast reduction    Family History  Problem Relation Age of Onset  . Osteoarthritis Mother   . Diabetes Mother   . Hypertension Mother   . Clotting disorder Mother     blood disorder  . Cancer Father     Lung  . Seizures Father   . Cancer Maternal Grandmother   . Heart attack Paternal Grandmother     Social History:  reports that she has never smoked. She has never used smokeless tobacco. She reports that she does not drink alcohol or use illicit drugs.    Review of Systems    Lipid history: Last LDL was 93 in 4/16 with normal triglycerides  She is reportedly taking pravastatin although this may have been started in 4/16 only   No results found for: CHOL, HDL, LDLCALC, LDLDIRECT, TRIG, CHOLHDL         Constitutional: no recent weight gain/loss, no complaints of unusual fatigue   Eyes: no history of blurred vision.  Most recent eye exam was in 2015  ENT: no nasal congestion, difficulty swallowing, no hoarseness    Cardiovascular: no chest pain or tightness on exertion.  No leg swelling.  Hypertension: This is being treated with amlodipine, not on ACE inhibitor  Respiratory: no cough/shortness of breath  Gastrointestinal: no constipation, diarrhea, nausea or abdominal pain  Musculoskeletal: no muscle/joint aches   Urological:   No frequency of urination or  nocturia  Skin: no rash or infections  Neurological: no headaches.  Has no numbness, burning, pains or tingling in feet.  History of seizure disorder followed by neurologist  Psychiatric: no symptoms of depression  Endocrine: No unusual fatigue, cold intolerance or history of thyroid disease   LABS:  Most recent creatinine level was 0.5   Physical Examination:  BP 122/78 mmHg  Pulse 86  Temp(Src) 97.7 F (36.5 C)  Resp 16  Ht 5' 3.5" (1.613 m)  Wt 220 lb 6.4 oz (99.973 kg)  BMI 38.43 kg/m2  SpO2 96%  GENERAL:         Patient has generalized obesity.   HEENT:         Eye exam shows normal external appearance. Fundus exam shows no  retinopathy. Oral exam shows normal mucosa .  NECK:   There is no lymphadenopathy Thyroid is not enlarged and no nodules felt.  Carotids are normal to palpation and no bruit heard LUNGS:         Chest is symmetrical. Lungs are clear to auscultation.Marland Kitchen   HEART:         Heart sounds:  S1 and S2 are normal. No murmurs or clicks heard., no S3 or S4.   ABDOMEN:   There is no distention present. Liver and spleen are not palpable. No other mass or tenderness present.   NEUROLOGICAL:   Vibration sense is  moderately reduced in distal first toes. Ankle jerks are absent bilaterally.  biceps reflexes are normal         Diabetic foot exam shows normal monofilament sensation in the toes and plantar surfaces, no skin lesions or ulcers on the feet and normal pedal pulses MUSCULOSKELETAL:  There is no swelling or deformity of the peripheral joints. Spine is normal to inspection.   EXTREMITIES:     There is no edema.  No skin lesions present.Marland Kitchen SKIN:       No rash or lesions of concern.        ASSESSMENT:  Diabetes type 2, uncontrolled with marked obesity She has had long-standing diabetes treated with insulin for several years without adequate control She is also insulin resistant and most likely is taking inadequate doses of insulin Difficult to know whether she is completely compliant with her insulin doses because of her limited understanding Also is checking blood sugars rather infrequently at home and these were not reviewed today   She is not motivated to exercise currently Also eating unbalanced meals especially at breakfast and is not able to control her diet with eating high-fat foods and snacks despite previous instructions from dietitian  She is a good candidate for adding a GLP-1 drug to help her blood sugar control, obesity and reduce her portions and snacking Discussed with the patient the nature of GLP-1 drugs and how they benefit blood glucose control, as well as the benefit of weight loss and  increase satiety . Explained possible side effects especially nausea and vomiting initially; discussed safety information in package insert.  Described the injection technique and dosage titration of Victoza  starting with 0.6 mg once a day at the same time for the first week and then increasing to 1.2 mg if no symptoms of nausea.  Educational brochure on Victoza and written instructions given on her summary as well as explained those instructions in detail to her  Complications: None evident  Other medical problems include hypertension, seizure disorder, questionable history of hyperlipidemia and MR, followed by PCP and neurologist  Hyperlipidemia: She is reportedly taking pravastatin although this is not listed on her PCP record  PLAN:   Start monitoring blood sugars on a regular basis at least once a day on an average at various times including postprandially as discussed  Blood sugar targets  both fasting and after meals were discussed  She will bring her blood sugar monitor for review  Consultation with nurse educator in about 10 days for review of management and detailed education  Continue Levemir for now but consider changing this to [place] or [place] if available on her insurance  Start Victoza as above  Increase mealtime dose to at least 12 units with each meal and probably more at suppertime if needed  Balanced meals especially at breakfast and she will need to reduce  carbohydrate in the morning and increase protein, reviewed breakfast choices in detail and handout given.  Start using exercise bike daily, discussed importance of exercise   Patient Instructions  Check blood sugars on waking up .Marland Kitchen3-4  .Marland Kitchen times a week Also check blood sugars about 2 hours after a meal and do this after different meals by rotation  Recommended blood sugar levels on waking up is 90-130 and about 2 hours after meal is 140-180 Please bring blood sugar monitor to each visit.  ORANGE PEN: TAKE 12 units WITH EACH MEAL  GREEN PEN 30 AT NIGhT once daily only  Start VICTOZA injection as shown once daily at the same time of the day.  Dial the dose to 0.6 mg on the pen for the first week.  You may inject in the stomach, thigh or arm. You may experience nausea in the first few days which usually goes away.  You will feel fullness of the stomach with starting the medication and should try to keep the portions at meals small. After 1 week increase the dose to 1.$RemoveB'2mg'nVPJdaNO$  daily if no nausea present.   If any questions or concerns are present call the office or the [facility] helpline at [phone]. Visit http://www.wall.info/ for more useful information  Stay on actoplusmet  Bring all meds on each visit    Counseling time on subjects discussed above is over 50% of today's 60 minute visit   Tammy Arellano 06/21/2014, 2:47 PM   Note: This office note was prepared with Dragon  voice recognition system technology. Any transcriptional errors that result from this process are unintentional.

## 2014-06-21 NOTE — Patient Instructions (Addendum)
Check blood sugars on waking up .Marland Kitchen.3-4  .Marland Kitchen. times a week Also check blood sugars about 2 hours after a meal and do this after different meals by rotation  Recommended blood sugar levels on waking up is 90-130 and about 2 hours after meal is 140-180 Please bring blood sugar monitor to each visit.  ORANGE PEN: TAKE 12 units WITH EACH MEAL  GREEN PEN 30 AT NIGhT once daily only  Start VICTOZA injection as shown once daily at the same time of the day.  Dial the dose to 0.6 mg on the pen for the first week.  You may inject in the stomach, thigh or arm. You may experience nausea in the first few days which usually goes away.  You will feel fullness of the stomach with starting the medication and should try to keep the portions at meals small. After 1 week increase the dose to 1.2mg  daily if no nausea present.   If any questions or concerns are present call the office or the Victoza [facility] helpline at [phone]. Visit [email or web address] for more useful information  Stay on actoplusmet  Bring all meds on each visit

## 2014-06-23 DIAGNOSIS — E1165 Type 2 diabetes mellitus with hyperglycemia: Secondary | ICD-10-CM | POA: Insufficient documentation

## 2014-06-23 DIAGNOSIS — IMO0002 Reserved for concepts with insufficient information to code with codable children: Secondary | ICD-10-CM | POA: Insufficient documentation

## 2014-06-28 ENCOUNTER — Other Ambulatory Visit: Payer: Self-pay

## 2014-06-28 ENCOUNTER — Other Ambulatory Visit: Payer: Self-pay | Admitting: Endocrinology

## 2014-06-28 MED ORDER — LIRAGLUTIDE 18 MG/3ML ~~LOC~~ SOPN: PEN_INJECTOR | SUBCUTANEOUS | Status: DC

## 2014-06-28 NOTE — Telephone Encounter (Signed)
Patient need a refill of victoza, [facility]  [facility] [place] - [address] [phone] (Phone) [phone] (Fax)

## 2014-06-28 NOTE — Telephone Encounter (Signed)
Rx for Victoza sent per request.

## 2014-07-01 ENCOUNTER — Encounter (HOSPITAL_COMMUNITY): Payer: Self-pay | Admitting: Physical Medicine and Rehabilitation

## 2014-07-01 ENCOUNTER — Emergency Department (HOSPITAL_COMMUNITY)
Admission: EM | Admit: 2014-07-01 | Discharge: 2014-07-01 | Disposition: A | Discharge status: Home / Self Care | Payer: Medicare Other | Attending: Emergency Medicine | Admitting: Emergency Medicine

## 2014-07-01 DIAGNOSIS — E669 Obesity, unspecified: Secondary | ICD-10-CM | POA: Diagnosis not present

## 2014-07-01 DIAGNOSIS — F79 Unspecified intellectual disabilities: Secondary | ICD-10-CM | POA: Diagnosis not present

## 2014-07-01 DIAGNOSIS — Z3202 Encounter for pregnancy test, result negative: Secondary | ICD-10-CM | POA: Insufficient documentation

## 2014-07-01 DIAGNOSIS — Z91040 Latex allergy status: Secondary | ICD-10-CM | POA: Diagnosis not present

## 2014-07-01 DIAGNOSIS — I10 Essential (primary) hypertension: Secondary | ICD-10-CM | POA: Insufficient documentation

## 2014-07-01 DIAGNOSIS — G40909 Epilepsy, unspecified, not intractable, without status epilepticus: Secondary | ICD-10-CM | POA: Insufficient documentation

## 2014-07-01 DIAGNOSIS — Z880 Allergy status to penicillin: Secondary | ICD-10-CM | POA: Insufficient documentation

## 2014-07-01 DIAGNOSIS — Z794 Long term (current) use of insulin: Secondary | ICD-10-CM | POA: Insufficient documentation

## 2014-07-01 DIAGNOSIS — J45909 Unspecified asthma, uncomplicated: Secondary | ICD-10-CM | POA: Insufficient documentation

## 2014-07-01 DIAGNOSIS — R569 Unspecified convulsions: Secondary | ICD-10-CM | POA: Diagnosis present

## 2014-07-01 DIAGNOSIS — R5601 Complex febrile convulsions: Secondary | ICD-10-CM | POA: Diagnosis not present

## 2014-07-01 DIAGNOSIS — Z79899 Other long term (current) drug therapy: Secondary | ICD-10-CM | POA: Insufficient documentation

## 2014-07-01 DIAGNOSIS — E119 Type 2 diabetes mellitus without complications: Secondary | ICD-10-CM | POA: Insufficient documentation

## 2014-07-01 LAB — I-STAT CHEM 8, ED
BUN: 10 mg/dL (ref 6–20)
Calcium, Ion: 1.13 mmol/L (ref 1.12–1.23)
Chloride: 100 mmol/L — ABNORMAL LOW (ref 101–111)
Creatinine, Ser: 0.5 mg/dL (ref 0.44–1.00)
GLUCOSE: 213 mg/dL — AB (ref 65–99)
HCT: 41 % (ref 36.0–46.0)
Hemoglobin: 13.9 g/dL (ref 12.0–15.0)
Potassium: 3.7 mmol/L (ref 3.5–5.1)
Sodium: 138 mmol/L (ref 135–145)
TCO2: 23 mmol/L (ref 0–100)

## 2014-07-01 LAB — URINALYSIS, ROUTINE W REFLEX MICROSCOPIC
Bilirubin Urine: NEGATIVE
Glucose, UA: NEGATIVE mg/dL
Hgb urine dipstick: NEGATIVE
Ketones, ur: NEGATIVE mg/dL
Leukocytes, UA: NEGATIVE
Nitrite: NEGATIVE
SPECIFIC GRAVITY, URINE: 1.016 (ref 1.005–1.030)
Urobilinogen, UA: 0.2 mg/dL (ref 0.0–1.0)
pH: 5.5 (ref 5.0–8.0)

## 2014-07-01 LAB — POC URINE PREG, ED: Preg Test, Ur: NEGATIVE

## 2014-07-01 LAB — URINE MICROSCOPIC-ADD ON

## 2014-07-01 LAB — CBG MONITORING, ED: Glucose-Capillary: 187 mg/dL — ABNORMAL HIGH (ref 65–99)

## 2014-07-01 MED ORDER — ONDANSETRON HCL 4 MG PO TABS: 4.0000 mg | ORAL_TABLET | Freq: Four times a day (QID) | ORAL | Status: DC

## 2014-07-01 MED ORDER — LEVETIRACETAM 750 MG PO TABS
1500.0000 mg | ORAL_TABLET | Freq: Once | ORAL | Status: AC
  Administered 2014-07-01: 1500 mg via ORAL
  Filled 2014-07-01: qty 2

## 2014-07-01 NOTE — ED Notes (Signed)
[facility] at bedside to take patient home.

## 2014-07-01 NOTE — Discharge Instructions (Signed)
Continue taking your medications as prescribed.  You may take the zofran as needed for nausea.  Please follow up with your family doctor and Neurologist.    Seizure, Adult A seizure is abnormal electrical activity in the brain. Seizures usually last from 30 seconds to 2 minutes. There are various types of seizures. Before a seizure, you may have a warning sensation (aura) that a seizure is about to occur. An aura may include the following symptoms:   Fear or anxiety.  Nausea.  Feeling like the room is spinning (vertigo).  Vision changes, such as seeing flashing lights or spots. Common symptoms during a seizure include:  A change in attention or behavior (altered mental status).  Convulsions with rhythmic jerking movements.  Drooling.  Rapid eye movements.  Grunting.  Loss of bladder and bowel control.  Bitter taste in the mouth.  Tongue biting. After a seizure, you may feel confused and sleepy. You may also have an injury resulting from convulsions during the seizure. HOME CARE INSTRUCTIONS   If you are given medicines, take them exactly as prescribed by your health care provider.  Keep all follow-up appointments as directed by your health care provider.  Do not swim or drive or engage in risky activity during which a seizure could cause further injury to you or others until your health care provider says it is OK.  Get adequate rest.  Teach friends and family what to do if you have a seizure. They should:  Lay you on the ground to prevent a fall.  Put a cushion under your head.  Loosen any tight clothing around your neck.  Turn you on your side. If vomiting occurs, this helps keep your airway clear.  Stay with you until you recover.  Know whether or not you need emergency care. SEEK IMMEDIATE MEDICAL CARE IF:  The seizure lasts longer than 5 minutes.  The seizure is severe or you do not wake up immediately after the seizure.  You have an altered mental  status after the seizure.  You are having more frequent or worsening seizures. Someone should drive you to the emergency department or call local emergency services (911 in [place] MAKE SURE YOU:  Understand these instructions.  Will watch your condition.  Will get help right away if you are not doing well or get worse. Document Released: 01/09/2000 Document Revised: 11/01/2012 Document Reviewed: 08/23/2012 [facility] Patient Information 2015 [place], [place]. This information is not intended to replace advice given to you by your health care provider. Make sure you discuss any questions you have with your health care provider.

## 2014-07-01 NOTE — ED Notes (Signed)
Spoke with patient's mother on phone, unable to find ride. [facility] called per [place], Engineer, [place] (Charge), pt to be transported home.

## 2014-07-01 NOTE — ED Notes (Signed)
Pt reports she had seizure at home this morning, doesn't remember exact events, states she woke up on laundry room floor. Injury noted to bottom lip, bleeding controlled upon arrival to ED. Pt is alert and answering questions appropriately at the time. Denies pain. Reports she has been taking Keppra as prescribed at home.

## 2014-07-01 NOTE — ED Provider Notes (Signed)
CSN: [id number]     Arrival date & time 07/01/14  0815 History   First MD Initiated Contact with Patient 07/01/14 [phone]     Chief Complaint  Patient presents with  . Seizures     Patient is a 49 y.o. female presenting with seizures. The history is provided by the patient. No language interpreter was used.  Seizures  Ms. Tammy Arellano presents for evaluation of a seizure.  Complete history is not available as patient is amnestic to recent events. Per EMS report she had a seizure at home, but she woke up on the laundry room floor. Per report the seizure lasted about 1 minute. She does have a history of seizure disorder, last seizure was about a year ago. She states that she feels like she is at her baseline currently. She takes Keppra and Lamictal for seizures, she has no missed doses but did have a lot of vomiting yesterday and believes she may have vomited up some of her medications. She denies any fevers, abdominal pain, dysuria, diarrhea. Symptoms are moderate, improving.   Past Medical History  Diagnosis Date  . Seizures   . Diabetes mellitus   . Mental retardation   . Pseudoseizures   . Obesity   . Hypertension   . Dyslipidemia   . Asthma    Past Surgical History  Procedure Laterality Date  . Breast reduction surgery    . Breast cyst resection    . Breast surgery      Breast reduction   Family History  Problem Relation Age of Onset  . Osteoarthritis Mother   . Diabetes Mother   . Hypertension Mother   . Clotting disorder Mother     blood disorder  . Cancer Father     Lung  . Seizures Father   . Cancer Maternal Grandmother   . Heart attack Paternal Grandmother    History  Substance Use Topics  . Smoking status: Never Smoker   . Smokeless tobacco: Never Used  . Alcohol Use: No   OB History    No data available     Review of Systems  Neurological: Positive for seizures.  All other systems reviewed and are negative.     Allergies  Dilantin; Furosemide; Shellfish  allergy; Depakote; Lip balm; Penicillins; and Latex  Home Medications   Prior to Admission medications   Medication Sig Start Date End Date Taking? Authorizing Provider  albuterol (PROVENTIL HFA;VENTOLIN HFA) 108 (90 BASE) MCG/ACT inhaler Inhale 2 puffs into the lungs every 6 (six) hours as needed.    Historical Provider, MD  amLODipine (NORVASC) 5 MG tablet Take 5 mg by mouth every morning.    Historical Provider, MD  beclomethasone (QVAR) 80 MCG/ACT inhaler Inhale 2 puffs into the lungs 2 (two) times daily.    Historical Provider, MD  Calcium Carbonate-Vitamin D (CALTRATE 600+D) 600-400 MG-UNIT per tablet Take 1 tablet by mouth daily.    Historical Provider, MD  ferrous fumarate (HEMOCYTE - 106 MG FE) 325 (106 FE) MG TABS tablet Take 1 tablet by mouth.    Historical Provider, MD  fexofenadine (ALLEGRA) 180 MG tablet Take 180 mg by mouth daily.    Historical Provider, MD  glucagon 1 MG injection Inject 1 mg into the vein once as needed (low blood sugar and not able to drink juice). 06/20/12   [name], MD  insulin aspart (NOVOLOG FLEXPEN) 100 UNIT/ML FlexPen Inject 0-9 Units into the skin 3 (three) times daily with meals. Sliding scale  Historical Provider, MD  insulin detemir (LEVEMIR) 100 unit/ml SOLN Inject 30 Units into the skin every morning.     Historical Provider, MD  LAMICTAL 200 MG tablet TAKE 1 TABLET BY MOUTH TWICE DAILY 03/03/14   [name], MD  levETIRAcetam (KEPPRA) 1000 MG tablet Take one and one half tablets each morning and take two tablets each evening 03/03/14   [name], MD  Liraglutide (VICTOZA) 18 MG/3ML SOPN Inject 0.6 mg into the skin for 7 days, then increase to 1.2 mg daily 06/28/14   [name], MD  loratadine (CLARITIN) 10 MG tablet Take 10 mg by mouth daily.    Historical Provider, MD  methocarbamol (ROBAXIN) 500 MG tablet Take 1 tablet (500 mg total) by mouth 2 (two) times daily. 05/21/14   Hanna Patel-Mills, PA-C  montelukast (SINGULAIR) 10 MG  tablet Take 10 mg by mouth at bedtime.    Historical Provider, MD  Multiple Vitamin (MULTIVITAMIN WITH MINERALS) TABS Take 1 tablet by mouth daily.    Historical Provider, MD  naproxen (NAPROSYN) 500 MG tablet Take 1 tablet (500 mg total) by mouth 2 (two) times daily. 05/21/14   Hanna Patel-Mills, PA-C  omeprazole (PRILOSEC) 20 MG capsule Take 20 mg by mouth daily.    Historical Provider, MD  oxyCODONE-acetaminophen (PERCOCET/ROXICET) 5-325 MG per tablet Take 2 tablets by mouth every 4 (four) hours as needed for severe pain. 05/21/14   Hanna Patel-Mills, PA-C  pioglitazone-metformin (ACTOPLUS MET) 15-850 MG per tablet Take 1 tablet by mouth daily with breakfast.    Historical Provider, MD  pravastatin (PRAVACHOL) 40 MG tablet  06/10/14   Historical Provider, MD  promethazine (PHENERGAN) 12.5 MG tablet Take 12.5 mg by mouth every 6 (six) hours as needed for nausea or vomiting.    Historical Provider, MD  promethazine (PHENERGAN) 25 MG tablet Take 1 tablet (25 mg total) by mouth every 6 (six) hours as needed for nausea or vomiting. 02/25/13   Wandra Arthurs, MD  vitamin C (ASCORBIC ACID) 500 MG tablet Take 500 mg by mouth daily.    Historical Provider, MD  vitamin E 400 UNIT capsule Take 400 Units by mouth daily.    Historical Provider, MD   BP 127/78 mmHg  Pulse 103  Temp(Src) 98.1 F (36.7 C) (Oral)  Resp 20  SpO2 97% Physical Exam  Constitutional: She is oriented to person, place, and time. She appears well-developed and well-nourished.  HENT:  Head: Normocephalic and atraumatic.  Right lateral tongue abrasion, hemostatic.  Cardiovascular: Normal rate and regular rhythm.   No murmur heard. Pulmonary/Chest: Effort normal and breath sounds normal. No respiratory distress.  Abdominal: Soft. There is no tenderness. There is no rebound and no guarding.  Musculoskeletal: She exhibits no edema or tenderness.  Neurological: She is alert and oriented to person, place, and time. No cranial nerve deficit.   Moves all extremities symmetrically  Skin: Skin is warm and dry.  Psychiatric: She has a normal mood and affect. Her behavior is normal.  Nursing note and vitals reviewed.   ED Course  Procedures (including critical care time) Labs Review Labs Reviewed  URINALYSIS, ROUTINE W REFLEX MICROSCOPIC (NOT AT [facility]) - Abnormal; Notable for the following:    APPearance CLOUDY (*)    Protein, ur >300 (*)    All other components within normal limits  URINE MICROSCOPIC-ADD ON - Abnormal; Notable for the following:    Squamous Epithelial / LPF FEW (*)    Bacteria, UA FEW (*)  Casts HYALINE CASTS (*)    All other components within normal limits  I-STAT CHEM 8, ED - Abnormal; Notable for the following:    Chloride 100 (*)    Glucose, Bld 213 (*)    All other components within normal limits  CBG MONITORING, ED - Abnormal; Notable for the following:    Glucose-Capillary 187 (*)    All other components within normal limits  URINE CULTURE  POC URINE PREG, ED    Imaging Review No results found.   EKG Interpretation   Date/Time:  Monday July 01 2014 08:19:35 EDT Ventricular Rate:  102 PR Interval:  188 QRS Duration: 101 QT Interval:  384 QTC Calculation: 500 R Axis:   -48 Text Interpretation:  Sinus tachycardia Ventricular premature complex  Probable left atrial enlargement LAD, consider left anterior fascicular  block Abnormal R-wave progression, late transition Borderline prolonged QT  interval Confirmed by Hazle Coca [phone]) on 07/01/2014 8:26:34 AM      MDM   Final diagnoses:  Seizure    Patient with history of seizure disorder, complaining with medications here for evaluation and seizure. She is currently at her baseline with no complaints. She did have vomiting yesterday and vomited up her seizure medications. Patient has a benign abdomen. Providing prescription for Zofran if she has recurrent nausea. Discussed seizures, taking her home medications, outpatient follow-up,  return precautions. UA not consistent with UTI, will culture. Cultures positive, consider treatment for UTI.   Quintella Reichert, MD 07/01/14 1016

## 2014-07-01 NOTE — ED Notes (Signed)
Pt presents to department via [facility] for evaluation of seizure. Pt had x1 seizure this morning, lasting approximately 1 minute. Injury to bottom lip noted, 20g L forearm. CBG 129. Pt initially postictal, but alert and oriented x4.

## 2014-07-02 ENCOUNTER — Encounter: Payer: Medicare Other | Attending: Endocrinology | Admitting: Nutrition

## 2014-07-02 DIAGNOSIS — E1165 Type 2 diabetes mellitus with hyperglycemia: Secondary | ICD-10-CM | POA: Diagnosis not present

## 2014-07-02 DIAGNOSIS — IMO0002 Reserved for concepts with insufficient information to code with codable children: Secondary | ICD-10-CM

## 2014-07-02 DIAGNOSIS — Z713 Dietary counseling and surveillance: Secondary | ICD-10-CM | POA: Diagnosis not present

## 2014-07-02 DIAGNOSIS — Z794 Long term (current) use of insulin: Secondary | ICD-10-CM | POA: Diagnosis not present

## 2014-07-02 NOTE — Patient Instructions (Signed)
Switch to diet drinks Add protein to breakfast to prevent mid morning snacking.   Limit fruit if snacking after breakfast to only one Test blood sugars once a day--vary it between before breakfast, or before lunch, or supper or 2 hr. After supper.

## 2014-07-02 NOTE — Progress Notes (Signed)
Medications:  Victoza 1.2 q AM Novolog 10u ac meals Levemir: 30u at 8PM  Typical day: 7AM up 8:30: bfast: frosted shredded wheat cereal with milk.   10AM:  Fruit-1-2 apples, or bananas 11AM: back to sleep for 2 hours 1PM: lunch: tuna salad with 6-12 crackers and regular can of soda to drink 3PM: walk with friend for 30-60 min. 4-6PM: snacks on "whatever is in the house"--chips, cookies, pretzels 7PM: supper:  Meat-4 ounces, mixed veg.  Water to drink , no bread 9PM: pickle and water 2PM: bed  SBGM:  Aviva testing once q 2 days:  FBSs: 79-137, acS: 183-213, HS: 187,   Says does not like to prick her finger.  Gave her a Fast clix and showed her how to use it.  She liked it a lot, and said she would test once a day.  Her blood sugar was 159, 2hr. pcB today  Plan. Switch to diet drinks Add protein to breakfast to prevent mid morning snacking.  Suggestions given for this--nuts, 2T peanut butter, cheese, meat from supper the night before Limit fruit if snacking after breakfast to only one Test blood sugars once a day--vary it between before breakfast, or before lunch, or supper or 2 hr. After supper.

## 2014-07-03 ENCOUNTER — Telehealth: Payer: Self-pay | Admitting: Endocrinology

## 2014-07-03 ENCOUNTER — Telehealth: Payer: Self-pay | Admitting: Nutrition

## 2014-07-03 LAB — URINE CULTURE: Colony Count: >=100000

## 2014-07-03 NOTE — Telephone Encounter (Signed)
Tammy Arellano, Please see below, this is the patient you saw yesterday!

## 2014-07-03 NOTE — Telephone Encounter (Signed)
Patients mother called stating that Dr. Lucianne MussKumar had prescribed Mrs. Tammy Arellano a new medication and she was sent to ER with seizure    Please advise   Thank you

## 2014-07-03 NOTE — Telephone Encounter (Signed)
Opened in error

## 2014-07-03 NOTE — Telephone Encounter (Signed)
Mother called saying the her daughter had a seizure last Sunday.  She has a history of seizures X 10 years.  She said that her daughter had not been taking the Victoza for the last 8 days, like she told me, but said that she has only been on it a few days.  (she told me she was taking" 1.2 of Victoza every day now.  She had increased the dose on Saturday")  Patient also told me that she did not have a low blood sugars, and that the EMS told her that her reading was "good".  The mother said that they did not test her blood sugar when she was there, but she did not go with her to the hospital.  Patient denied feeling hot, chilled, wet, or other symptoms of low blood sugar when she awoke from the seizure.  Mother wants to know if she needs to stop the dose of Victoza.

## 2014-07-17 ENCOUNTER — Encounter: Payer: Self-pay | Admitting: Endocrinology

## 2014-07-17 ENCOUNTER — Ambulatory Visit (INDEPENDENT_AMBULATORY_CARE_PROVIDER_SITE_OTHER): Payer: Medicare Other | Admitting: Endocrinology

## 2014-07-17 VITALS — BP 128/82 | HR 92 | Temp 97.7°F | Resp 16 | Ht 63.5 in | Wt 228.0 lb

## 2014-07-17 DIAGNOSIS — E1165 Type 2 diabetes mellitus with hyperglycemia: Secondary | ICD-10-CM

## 2014-07-17 DIAGNOSIS — E7800 Pure hypercholesterolemia, unspecified: Secondary | ICD-10-CM

## 2014-07-17 DIAGNOSIS — G40909 Epilepsy, unspecified, not intractable, without status epilepticus: Secondary | ICD-10-CM | POA: Diagnosis not present

## 2014-07-17 DIAGNOSIS — IMO0002 Reserved for concepts with insufficient information to code with codable children: Secondary | ICD-10-CM

## 2014-07-17 DIAGNOSIS — I10 Essential (primary) hypertension: Secondary | ICD-10-CM | POA: Diagnosis not present

## 2014-07-17 NOTE — Progress Notes (Signed)
Patient ID: Tammy Arellano, female   DOB: Sep 20, 1965, 49 y.o.   MRN: [id number]           Reason for Appointment: Follow-up for Type 2 Diabetes  Referring physician: Tommy Medal  History of Present Illness:          Date of diagnosis of type 2 diabetes mellitus :        Background history:  Most likely she had been on oral hypoglycemic drugs for several years and this was switched to insulin about 10 years ago Not clear if she ever took any GLP-1 drugs, she does not remember taking Victoza Records available indicate that her A1c in 2015 was 12.1 in May and 9.5, 3 months later Her records indicate that she has been on various insulin regimens in the past including Lantus and 50/50 insulin but most recently has been taking Levemir and Novolog for some time  Recent history:  On her initial consultation was felt that she was having poor control of her diabetes related to not following her diet, some possible confusion about the types of insulin she was taking and also checking blood sugars very infrequently Also because of her difficulty with losing weight and going off her diet frequently she was started on Victoza She has taken 1.2 mg Victoza daily but she does not think she has cut back on portions are sweets She has actually gained weight   However she has started checking her blood sugars regularly; also appears to be taking her insulin as directed   INSULIN regimen is described as: Levemir Insulin 30 hs   Novolog 10 actid      Current blood sugar patterns and problems identified:  Diet: She is still drinking occasional regular soft drinks; also will sometimes have junk food like candy, chips and sweets  She is checking her blood sugars mostly in the mornings and these are relatively good  He has sporadic high readings after meals including breakfast  She has only a few readings after supper and these are mostly a little high, not clear if this is mostly related to her  inconsistent diet  Although she has seen the nurse educator she is still not eating balanced meals and will sometimes have cereal and fruit for breakfast: However this morning with this her blood sugar went up to 166 only    she is still not motivated to do any exercise  Oral hypoglycemic drugs the patient is taking are: ?  Actoplusmet once a day.  Patient is unsure about this      Side effects from medications have been: None  Compliance with the medical regimen: Poor Hypoglycemia:   none  Glucose monitoring:  done 1-2 times a day         Glucometer: Accucheck     Blood Glucose readings by recall:   Mean values apply above for all meters except median for One Touch  PRE-MEAL Fasting Lunch Dinner Bedtime Overall  Glucose range: 79-171  129-153    72, 195    Mean/median:     146   POST-MEAL PC Breakfast PC Lunch PC Dinner  Glucose range:   122-237   157-213   Mean/median:      Self-care: The diet that the patient has been following is: none, may eat junk food     Typical meal intake: Breakfast is cereal, rice cakes, usually 3 meals a day  Dietician visit, most recent: 9/14. CDE 5/16               Exercise: exercise bike at home, generally once a week  Weight history:  Wt Readings from Last 3 Encounters:  07/17/14 228 lb (103.42 kg)  06/21/14 220 lb 6.4 oz (99.973 kg)  09/28/13 239 lb (108.41 kg)    Glycemic control:   Lab Results  Component Value Date   HGBA1C 12.3* 08/24/2011   Lab Results  Component Value Date   CREATININE 0.50 07/01/2014         Medication List       This list is accurate as of: 07/17/14  2:22 PM.  Always use your most recent med list.               albuterol 108 (90 BASE) MCG/ACT inhaler  Commonly known as:  PROVENTIL HFA;VENTOLIN HFA  Inhale 2 puffs into the lungs every 6 (six) hours as needed.     amLODipine 5 MG tablet  Commonly known as:  NORVASC  Take 5 mg by mouth every morning.     beclomethasone 80 MCG/ACT  inhaler  Commonly known as:  QVAR  Inhale 2 puffs into the lungs 2 (two) times daily.     CALTRATE 600+D 600-400 MG-UNIT per tablet  Generic drug:  Calcium Carbonate-Vitamin D  Take 1 tablet by mouth daily.     ferrous fumarate 325 (106 FE) MG Tabs tablet  Commonly known as:  HEMOCYTE - 106 mg FE  Take 1 tablet by mouth.     fexofenadine 180 MG tablet  Commonly known as:  ALLEGRA  Take 180 mg by mouth daily.     glucagon 1 MG injection  Inject 1 mg into the vein once as needed (low blood sugar and not able to drink juice).     insulin detemir 100 unit/ml Soln  Commonly known as:  LEVEMIR  Inject 30 Units into the skin every morning.     LAMICTAL 200 MG tablet  Generic drug:  lamoTRIgine  TAKE 1 TABLET BY MOUTH TWICE DAILY     levETIRAcetam 1000 MG tablet  Commonly known as:  KEPPRA  Take one and one half tablets each morning and take two tablets each evening     Liraglutide 18 MG/3ML Sopn  Commonly known as:  VICTOZA  Inject 0.6 mg into the skin for 7 days, then increase to 1.2 mg daily     loratadine 10 MG tablet  Commonly known as:  CLARITIN  Take 10 mg by mouth daily.     methocarbamol 500 MG tablet  Commonly known as:  ROBAXIN  Take 1 tablet (500 mg total) by mouth 2 (two) times daily.     montelukast 10 MG tablet  Commonly known as:  SINGULAIR  Take 10 mg by mouth at bedtime.     multivitamin with minerals Tabs tablet  Take 1 tablet by mouth daily.     naproxen 500 MG tablet  Commonly known as:  NAPROSYN  Take 1 tablet (500 mg total) by mouth 2 (two) times daily.     NOVOLOG FLEXPEN 100 UNIT/ML FlexPen  Generic drug:  insulin aspart  Inject 0-9 Units into the skin 3 (three) times daily with meals. Sliding scale     omeprazole 20 MG capsule  Commonly known as:  PRILOSEC  Take 20 mg by mouth daily.     ondansetron 4 MG tablet  Commonly known as:  ZOFRAN  Take 1 tablet (  4 mg total) by mouth every 6 (six) hours.     oxyCODONE-acetaminophen 5-325 MG  per tablet  Commonly known as:  PERCOCET/ROXICET  Take 2 tablets by mouth every 4 (four) hours as needed for severe pain.     pioglitazone-metformin 15-850 MG per tablet  Commonly known as:  ACTOPLUS MET  Take 1 tablet by mouth daily with breakfast.     pravastatin 40 MG tablet  Commonly known as:  PRAVACHOL  Take 40 mg by mouth daily.     promethazine 25 MG tablet  Commonly known as:  PHENERGAN  Take 1 tablet (25 mg total) by mouth every 6 (six) hours as needed for nausea or vomiting.     promethazine 12.5 MG tablet  Commonly known as:  PHENERGAN  Take 12.5 mg by mouth every 6 (six) hours as needed for nausea or vomiting.     vitamin C 500 MG tablet  Commonly known as:  ASCORBIC ACID  Take 500 mg by mouth daily.     vitamin E 400 UNIT capsule  Take 400 Units by mouth daily.        Allergies:  Allergies  Allergen Reactions  . Dilantin [Phenytoin Sodium Extended] Anaphylaxis    Induces seizures   . Furosemide Anaphylaxis  . Shellfish Allergy Anaphylaxis  . Depakote [Divalproex Sodium] Other (See Comments)    Leukopenia and weight gain  . Lip Balm [Chapstick] Other (See Comments)    Lips swell only chapstick brand  . Penicillins Nausea And Vomiting  . Latex Rash    Past Medical History  Diagnosis Date  . Seizures   . Diabetes mellitus   . Mental retardation   . Pseudoseizures   . Obesity   . Hypertension   . Dyslipidemia   . Asthma     Past Surgical History  Procedure Laterality Date  . Breast reduction surgery    . Breast cyst resection    . Breast surgery      Breast reduction    Family History  Problem Relation Age of Onset  . Osteoarthritis Mother   . Diabetes Mother   . Hypertension Mother   . Clotting disorder Mother     blood disorder  . Cancer Father     Lung  . Seizures Father   . Cancer Maternal Grandmother   . Heart attack Paternal Grandmother     Social History:  reports that she has never smoked. She has never used smokeless  tobacco. She reports that she does not drink alcohol or use illicit drugs.    Review of Systems   She had a seizure and was seen in the ER.  She says she has difficulty getting appointment with her neurologist for follow-up   Lipid history: Last LDL was 93 in 4/16 with normal triglycerides  She is reportedly taking pravastatin although this may have been started in 4/16 only   No results found for: CHOL, HDL, LDLCALC, LDLDIRECT, TRIG, CHOLHDL          Most recent eye exam was in 2015   Hypertension: This is being treated with amlodipine, not on ACE inhibitor  Last diabetic foot exam was in 05/2014   Physical Examination:  BP 128/82 mmHg  Pulse 92  Temp(Src) 97.7 F (36.5 C)  Resp 16  Ht 5' 3.5" (1.613 m)  Wt 228 lb (103.42 kg)  BMI 39.75 kg/m2  SpO2 98%        ASSESSMENT:  Diabetes type 2, uncontrolled with BMI around  40  See history of present illness for detailed discussion of his current management, blood sugar patterns and problems identified Although she has started checking blood sugars since her initial consultation she is not consistent with other self-care measures Has gained weight despite taking Victoza although this may be related to somewhat better blood sugar control overall Her main difficulty is inconsistent diet and periodically will have high postprandial readings Also he is not motivated to exercise She has taken her insulin regimen as directed  PLAN:   Continue current regimen unchanged  She will try to be more consistent with diet  He will schedule her to see a nutritionist and have her review day-to-day management of diabetes also.  Start using exercise bike daily, discussed importance of exercise: Her mother will encourage her to do this  Balanced meals with some protein at each meal and avoid foods and drinks with high sugar or fat content.  She was advised to make a note of what kind of foods make her sugar go up  Check lipids on the  next visit  Needs annual eye exams  Referral made to neurologist for seizure management, she is unable to get appointment with her present neurologist  Patient Instructions  Check blood sugars on waking up .Marland Kitchen3-4  .Marland Kitchen times a week Also check blood sugars about 2 hours after a meal and do this after different meals by rotation  Recommended blood sugar levels on waking up is 90-130 and about 2 hours after meal is 140-180 Please bring blood sugar monitor to each visit.  Reduce sweet foods and drinks  Exercise daily   Counseling time on subjects discussed above is over 50% of today's 25 minute visit   Tammy Arellano 07/17/2014, 2:22 PM   Note: This office note was prepared with Estate agent. Any transcriptional errors that result from this process are unintentional.

## 2014-07-17 NOTE — Patient Instructions (Signed)
Check blood sugars on waking up .Marland Kitchen3-4  .Marland Kitchen times a week Also check blood sugars about 2 hours after a meal and do this after different meals by rotation  Recommended blood sugar levels on waking up is 90-130 and about 2 hours after meal is 140-180 Please bring blood sugar monitor to each visit.  Reduce sweet foods and drinks  Exercise daily

## 2014-07-23 NOTE — Telephone Encounter (Signed)
Noted  

## 2014-07-24 ENCOUNTER — Ambulatory Visit: Payer: Medicare Other | Admitting: *Deleted

## 2014-08-22 ENCOUNTER — Ambulatory Visit: Payer: Medicare Other | Admitting: *Deleted

## 2014-08-25 ENCOUNTER — Emergency Department (HOSPITAL_COMMUNITY): Payer: Medicare Other

## 2014-08-25 ENCOUNTER — Encounter (HOSPITAL_COMMUNITY): Payer: Self-pay | Admitting: *Deleted

## 2014-08-25 ENCOUNTER — Emergency Department (HOSPITAL_COMMUNITY)
Admission: EM | Admit: 2014-08-25 | Discharge: 2014-08-25 | Disposition: A | Discharge status: Home / Self Care | Payer: Medicare Other | Attending: Emergency Medicine | Admitting: Emergency Medicine

## 2014-08-25 DIAGNOSIS — Y9389 Activity, other specified: Secondary | ICD-10-CM | POA: Insufficient documentation

## 2014-08-25 DIAGNOSIS — Y998 Other external cause status: Secondary | ICD-10-CM | POA: Diagnosis not present

## 2014-08-25 DIAGNOSIS — E669 Obesity, unspecified: Secondary | ICD-10-CM | POA: Insufficient documentation

## 2014-08-25 DIAGNOSIS — F79 Unspecified intellectual disabilities: Secondary | ICD-10-CM | POA: Insufficient documentation

## 2014-08-25 DIAGNOSIS — E119 Type 2 diabetes mellitus without complications: Secondary | ICD-10-CM | POA: Insufficient documentation

## 2014-08-25 DIAGNOSIS — Y929 Unspecified place or not applicable: Secondary | ICD-10-CM | POA: Diagnosis not present

## 2014-08-25 DIAGNOSIS — J45909 Unspecified asthma, uncomplicated: Secondary | ICD-10-CM | POA: Insufficient documentation

## 2014-08-25 DIAGNOSIS — I10 Essential (primary) hypertension: Secondary | ICD-10-CM | POA: Insufficient documentation

## 2014-08-25 DIAGNOSIS — G40909 Epilepsy, unspecified, not intractable, without status epilepticus: Secondary | ICD-10-CM | POA: Diagnosis not present

## 2014-08-25 DIAGNOSIS — T426X1A Poisoning by other antiepileptic and sedative-hypnotic drugs, accidental (unintentional), initial encounter: Secondary | ICD-10-CM | POA: Insufficient documentation

## 2014-08-25 DIAGNOSIS — T4271XA Poisoning by unspecified antiepileptic and sedative-hypnotic drugs, accidental (unintentional), initial encounter: Secondary | ICD-10-CM

## 2014-08-25 DIAGNOSIS — R930 Abnormal findings on diagnostic imaging of skull and head, not elsewhere classified: Secondary | ICD-10-CM | POA: Diagnosis not present

## 2014-08-25 LAB — VALPROIC ACID LEVEL

## 2014-08-25 LAB — CBC WITH DIFFERENTIAL/PLATELET
Basophils Absolute: 0 10*3/uL (ref 0.0–0.1)
Basophils Relative: 1 % (ref 0–1)
EOS PCT: 0 % (ref 0–5)
Eosinophils Absolute: 0 10*3/uL (ref 0.0–0.7)
HCT: 36.5 % (ref 36.0–46.0)
HEMOGLOBIN: 12.1 g/dL (ref 12.0–15.0)
LYMPHS ABS: 0.9 10*3/uL (ref 0.7–4.0)
Lymphocytes Relative: 25 % (ref 12–46)
MCH: 26.9 pg (ref 26.0–34.0)
MCHC: 33.2 g/dL (ref 30.0–36.0)
MCV: 81.3 fL (ref 78.0–100.0)
MONO ABS: 0.3 10*3/uL (ref 0.1–1.0)
Monocytes Relative: 9 % (ref 3–12)
NEUTROS ABS: 2.4 10*3/uL (ref 1.7–7.7)
Neutrophils Relative %: 65 % (ref 43–77)
Platelets: 189 10*3/uL (ref 150–400)
RBC: 4.49 MIL/uL (ref 3.87–5.11)
RDW: 14.8 % (ref 11.5–15.5)
WBC: 3.7 10*3/uL — AB (ref 4.0–10.5)

## 2014-08-25 LAB — URINE MICROSCOPIC-ADD ON

## 2014-08-25 LAB — I-STAT CHEM 8, ED
BUN: 9 mg/dL (ref 6–20)
Calcium, Ion: 1.16 mmol/L (ref 1.12–1.23)
Chloride: 97 mmol/L — ABNORMAL LOW (ref 101–111)
Creatinine, Ser: 0.5 mg/dL (ref 0.44–1.00)
GLUCOSE: 118 mg/dL — AB (ref 65–99)
HCT: 43 % (ref 36.0–46.0)
Hemoglobin: 14.6 g/dL (ref 12.0–15.0)
POTASSIUM: 3.7 mmol/L (ref 3.5–5.1)
SODIUM: 139 mmol/L (ref 135–145)
TCO2: 28 mmol/L (ref 0–100)

## 2014-08-25 LAB — URINALYSIS, ROUTINE W REFLEX MICROSCOPIC
BILIRUBIN URINE: NEGATIVE
GLUCOSE, UA: 500 mg/dL — AB
Hgb urine dipstick: NEGATIVE
Ketones, ur: NEGATIVE mg/dL
Leukocytes, UA: NEGATIVE
Nitrite: NEGATIVE
Protein, ur: 30 mg/dL — AB
SPECIFIC GRAVITY, URINE: 1.027 (ref 1.005–1.030)
UROBILINOGEN UA: 0.2 mg/dL (ref 0.0–1.0)
pH: 7.5 (ref 5.0–8.0)

## 2014-08-25 LAB — BASIC METABOLIC PANEL
ANION GAP: 10 (ref 5–15)
BUN: 7 mg/dL (ref 6–20)
CALCIUM: 9.4 mg/dL (ref 8.9–10.3)
CO2: 28 mmol/L (ref 22–32)
CREATININE: 0.5 mg/dL (ref 0.44–1.00)
Chloride: 100 mmol/L — ABNORMAL LOW (ref 101–111)
GFR calc non Af Amer: >60 mL/min (ref >60)
Glucose, Bld: 119 mg/dL — ABNORMAL HIGH (ref 65–99)
Potassium: 3.7 mmol/L (ref 3.5–5.1)
SODIUM: 138 mmol/L (ref 135–145)

## 2014-08-25 LAB — CBG MONITORING, ED: Glucose-Capillary: 132 mg/dL — ABNORMAL HIGH (ref 65–99)

## 2014-08-25 NOTE — Discharge Instructions (Signed)
Accidental Overdose  A drug overdose occurs when a chemical substance (drug or medication) is used in amounts large enough to overcome a person. This may result in severe illness or death. This is a type of poisoning. Accidental overdoses of medications or other substances come from a variety of reasons. When this happens accidentally, it is often because the person taking the substance does not know enough about what they have taken. Drugs which commonly cause overdose deaths are alcohol, psychotropic medications (medications which affect the mind), pain medications, illegal drugs (street drugs) such as cocaine and heroin, and multiple drugs taken at the same time. It may result from careless behavior (such as over-indulging at a party). Other causes of overdose may include multiple drug use, a lapse in memory, or drug use after a period of no drug use.   Sometimes overdosing occurs because a person cannot remember if they have taken their medication.   A common unintentional overdose in young children involves multi-vitamins containing iron. Iron is a part of the hemoglobin molecule in blood. It is used to transport oxygen to living cells. When taken in small amounts, iron allows the body to restock hemoglobin. In large amounts, it causes problems in the body. If this overdose is not treated, it can lead to death.  Never take medicines that show signs of tampering or do not seem quite right. Never take medicines in the dark or in poor lighting. Read the label and check each dose of medicine before you take it. When adults are poisoned, it happens most often through carelessness or lack of information. Taking medicines in the dark or taking medicine prescribed for someone else to treat the same type of problem is a dangerous practice.  SYMPTOMS   Symptoms of overdose depend on the medication and amount taken. They can vary from over-activity with stimulant over-dosage, to sleepiness from depressants such as  alcohol, narcotics and tranquilizers. Confusion, dizziness, nausea and vomiting may be present. If problems are severe enough coma and death may result.  DIAGNOSIS   Diagnosis and management are generally straightforward if the drug is known. Otherwise it is more difficult. At times, certain symptoms and signs exhibited by the patient, or blood tests, can reveal the drug in question.   TREATMENT   In an emergency department, most patients can be treated with supportive measures. Antidotes may be available if there has been an overdose of opioids or benzodiazepines. A rapid improvement will often occur if this is the cause of overdose.  At home or away from medical care:   There may be no immediate problems or warning signs in children.   Not everything works well in all cases of poisoning.   Take immediate action. Poisons may act quickly.   If you think someone has swallowed medicine or a household product, and the person is unconscious, having seizures (convulsions), or is not breathing, immediately call for an ambulance.  IF a person is conscious and appears to be doing OK but has swallowed a poison:   Do not wait to see what effect the poison will have. Immediately call a poison control center (listed in the white pages of your telephone book under "Poison Control" or inside the front cover with other emergency numbers). Some poison control centers have TTY capability for the deaf. Check with your local center if you or someone in your family requires this service.   Keep the container so you can read the label on the product for ingredients.     Describe what, when, and how much was taken and the age and condition of the person poisoned. Inform them if the person is vomiting, choking, drowsy, shows a change in color or temperature of skin, is conscious or unconscious, or is convulsing.   Do not cause vomiting unless instructed by medical personnel. Do not induce vomiting or force liquids into a person who  is convulsing, unconscious, or very drowsy.  Stay calm and in control.    Activated charcoal also is sometimes used in certain types of poisoning and you may wish to add a supply to your emergency medicines. It is available without a prescription. Call a poison control center before using this medication.  PREVENTION   Thousands of children die every year from unintentional poisoning. This may be from household chemicals, poisoning from carbon monoxide in a car, taking their parent's medications, or simply taking a few iron pills or vitamins with iron. Poisoning comes from unexpected sources.   Store medicines out of the sight and reach of children, preferably in a locked cabinet. Do not keep medications in a food cabinet. Always store your medicines in a secure place. Get rid of expired medications.   If you have children living with you or have them as occasional guests, you should have child-resistant caps on your medicine containers. Keep everything out of reach. Child proof your home.   If you are called to the telephone or to answer the door while you are taking a medicine, take the container with you or put the medicine out of the reach of small children.   Do not take your medication in front of children. Do not tell your child how good a medication is and how good it is for them. They may get the idea it is more of a treat.   If you are an adult and have accidentally taken an overdose, you need to consider how this happened and what can be done to prevent it from happening again. If this was from a street drug or alcohol, determine if there is a problem that needs addressing. If you are not sure a problems exists, it is easy to talk to a professional and ask them if they think you have a problem. It is better to handle this problem in this way before it happens again and has a much worse consequence.  Document Released: 03/27/2004 Document Revised: 04/05/2011 Document Reviewed: 09/02/2008  [employer]  Patient Information 2015 [employer]. This information is not intended to replace advice given to you by your health care provider. Make sure you discuss any questions you have with your health care provider.

## 2014-08-25 NOTE — ED Provider Notes (Signed)
History   Chief Complaint  Patient presents with  . Fatigue    pt not taking meds    HPI Patient is a 49 year old female past history is below notable for mild mental retardation, diabetes, history of seizures, history of pseudoseizures, asthma who presents ED accompanied by her sister for evaluation of fatigue and sleepiness. Sister reports that patient normally lives with her mother who is on vacation for the past 10 days and patient has been living alone. Sister says that they went to lunch today she noted that the patient was falling asleep during their meal and conversation which is atypical for her. Sister says pt will act this way sometimes after having seizures although pt denies having any seizures (sister says she may not know however). Additionally, sister checked patient's medications and found that patient had not been taking her nighttime medications for the past 10 days. This included 2000 mg of Keppra and 200 mg of Lamictal that she was not taking. Patient was getting those dosages during the morning however. Patient is without complaints currently. Sister also reports the patient is acting normally now and her gait is normal as well. Patient specifically denies any cough, fever, nausea, vomiting, diarrhea, chest pain, shortness of breath or other symptoms. Onset of symptoms: gradual. Duration 1 day.  Modifying factors none.  Severity: mild.  Associated symptoms: none.  Hx of similar symptoms: no.    Past medical/surgical history, social history, medications, allergies and FH have been reviewed with patient and/or in documentation. Furthermore, if pt family or friend(s) present, additional historical information was obtained from them.  Past Medical History  Diagnosis Date  . Seizures   . Diabetes mellitus   . Mental retardation   . Pseudoseizures   . Obesity   . Hypertension   . Dyslipidemia   . Asthma    Past Surgical History  Procedure Laterality Date  . Breast  reduction surgery    . Breast cyst resection    . Breast surgery      Breast reduction   Family History  Problem Relation Age of Onset  . Osteoarthritis Mother   . Diabetes Mother   . Hypertension Mother   . Clotting disorder Mother     blood disorder  . Cancer Father     Lung  . Seizures Father   . Cancer Maternal Grandmother   . Heart attack Paternal Grandmother    History  Substance Use Topics  . Smoking status: Never Smoker   . Smokeless tobacco: Never Used  . Alcohol Use: No     Review of Systems Unable to obtain 2/2 MR   Physical Exam  Physical Exam  ED Triage Vitals  Enc Vitals Group     BP 08/25/14 1557 145/75 mmHg     Pulse Rate 08/25/14 1557 95     Resp 08/25/14 1557 14     Temp 08/25/14 1557 98.8 F (37.1 C)     Temp Source 08/25/14 1557 Oral     SpO2 08/25/14 1557 98 %     Weight --      Height --      Head Cir --      Peak Flow --      Pain Score 08/25/14 1607 0     Pain Loc --      Pain Edu? --      Excl. in GC? --    Constitutional: Patient is well appearing fem and in no acute distress Head: Normocephalic and  atraumatic.  Eyes: Extraocular motion intact, no scleral icterus Mouth: MMM, OP clear Neck: Supple without meningismus, mass, or overt JVD Respiratory: No respiratory distress. Normal WOB. No w/r/g. CV: RRR, no obvious murmurs.  Pulses +2 and symmetric. Euvolemic Abdomen: Soft, NT, ND, no r/g. No mass.  MSK: Extremities are atraumatic without deformity, ROM intact Skin: Warm, dry, intact without rash Neuro: HDS, AAOx4. PERRL, EOMI, TML, face sym. CN 2-12 grossly intact. 5/5 sym, no drift, SILT, normal gait and coordination.   ED Course  Procedures   Labs Reviewed  BASIC METABOLIC PANEL - Abnormal; Notable for the following:    Chloride 100 (*)    Glucose, Bld 119 (*)    All other components within normal limits  VALPROIC ACID LEVEL - Abnormal; Notable for the following:    Valproic Acid Lvl <10 (*)    All other components  within normal limits  URINALYSIS, ROUTINE W REFLEX MICROSCOPIC (NOT AT [facility]) - Abnormal; Notable for the following:    Glucose, UA 500 (*)    Protein, ur 30 (*)    All other components within normal limits  CBC WITH DIFFERENTIAL/PLATELET - Abnormal; Notable for the following:    WBC 3.7 (*)    All other components within normal limits  URINE MICROSCOPIC-ADD ON - Abnormal; Notable for the following:    Squamous Epithelial / LPF FEW (*)    All other components within normal limits  CBG MONITORING, ED - Abnormal; Notable for the following:    Glucose-Capillary 132 (*)    All other components within normal limits  I-STAT CHEM 8, ED - Abnormal; Notable for the following:    Chloride 97 (*)    Glucose, Bld 118 (*)    All other components within normal limits   I personally reviewed and interpreted all labs.  CT Head Wo Contrast    (Results Pending)   I personally viewed above image(s) which were used in my medical decision making. Formal interpretations by Radiology.  MDM: JESSY CALIXTE is a 49 y.o. female with H&P as above who p/w CC: fatigue/sleepiness Patient is well-appearing, well-hydrated, hemodynamically stable and in no apparent distress on arrival. She has a benign exam as above. Given the findings as stated above will get screening labs, check Depakote level, head CT.  On further discussion with sister, patient apparently took an entire day's worth of Keppra and Lamictal this morning. This could explain patient's sleepiness today while patient was at lunch with her sister. Patient had a proximally 4000 mg Keppra and 400 mg of Lamictal. EKG shows normal sinus rhythm and normal QT.  Workup was otherwise unremarkable today. Head CT was negative. Depakote level was actually low. Patient was advised to follow closely with her PCP. Sister urged to assist with pills. Sister states that she will administer all of her medications until her mother is back from vacation. Stable for  discharge.  Old records reviewed (if available). Labs and imaging reviewed personally by myself and considered in medical decision making if ordered. Clinical Impression: 1. Accidental overdose of anticonvulsant, initial encounter     Disposition: Discharge  Condition: Good  I have discussed the results, Dx and Tx plan with the pt(& family if present). He/she/they expressed understanding and agree(s) with the plan. Discharge instructions discussed at great length. Strict return precautions discussed and pt &/or family have verbalized understanding of the instructions. No further questions at time of discharge.    Discharge Medication List as of 08/25/2014  7:26 PM  Follow Up: Juline Patch, MD [address], [address] [place] [address] [phone]  Schedule an appointment as soon as possible for a visit in 1 week   [facility] EMERGENCY DEPARTMENT [address] [id number] mc [place] [address] [phone]  If symptoms worsen   Pt seen in conjunction with Dr. Vinnie Langton, DO [facility] Emergency Medicine Resident - PGY-3      Ames Dura, MD 08/26/14 [id number]  Eber Hong, MD 08/26/14 [phone]

## 2014-08-25 NOTE — ED Notes (Signed)
Family states pt lives with mother and mother is out of town. Sister noticed this am that pt kept falling to sleep. Sister looked at pt's meds and found that pt has not been taking her night time seizure meds since her mother left.

## 2014-08-26 ENCOUNTER — Other Ambulatory Visit: Payer: Medicare Other

## 2014-08-29 ENCOUNTER — Ambulatory Visit: Payer: Medicare Other | Admitting: Endocrinology

## 2014-08-29 ENCOUNTER — Other Ambulatory Visit (INDEPENDENT_AMBULATORY_CARE_PROVIDER_SITE_OTHER): Payer: Medicare Other

## 2014-08-29 DIAGNOSIS — E1165 Type 2 diabetes mellitus with hyperglycemia: Secondary | ICD-10-CM | POA: Diagnosis not present

## 2014-08-29 DIAGNOSIS — E7800 Pure hypercholesterolemia, unspecified: Secondary | ICD-10-CM

## 2014-08-29 DIAGNOSIS — IMO0002 Reserved for concepts with insufficient information to code with codable children: Secondary | ICD-10-CM

## 2014-08-29 LAB — LIPID PANEL
CHOL/HDL RATIO: 2
Cholesterol: 129 mg/dL (ref 0–200)
HDL: 57.8 mg/dL (ref >39.00)
LDL Cholesterol: 64 mg/dL (ref 0–99)
NONHDL: 70.85
Triglycerides: 35 mg/dL (ref 0.0–149.0)
VLDL: 7 mg/dL (ref 0.0–40.0)

## 2014-08-29 LAB — BASIC METABOLIC PANEL
BUN: 12 mg/dL (ref 6–23)
CALCIUM: 9.3 mg/dL (ref 8.4–10.5)
CO2: 30 meq/L (ref 19–32)
Chloride: 100 mEq/L (ref 96–112)
Creatinine, Ser: 0.59 mg/dL (ref 0.40–1.20)
GFR: 140.6 mL/min (ref >60.00)
GLUCOSE: 280 mg/dL — AB (ref 70–99)
Potassium: 4.4 mEq/L (ref 3.5–5.1)
Sodium: 136 mEq/L (ref 135–145)

## 2014-08-29 LAB — HEMOGLOBIN A1C: Hgb A1c MFr Bld: 8.5 % — ABNORMAL HIGH (ref 4.6–6.5)

## 2014-08-29 LAB — MICROALBUMIN / CREATININE URINE RATIO
CREATININE, U: 130.3 mg/dL
Microalb Creat Ratio: 1.2 mg/g (ref 0.0–30.0)
Microalb, Ur: 1.5 mg/dL (ref 0.0–1.9)

## 2014-09-02 ENCOUNTER — Telehealth: Payer: Self-pay | Admitting: Endocrinology

## 2014-09-02 ENCOUNTER — Ambulatory Visit: Payer: Medicare Other | Admitting: Endocrinology

## 2014-09-02 ENCOUNTER — Encounter: Payer: Self-pay | Admitting: *Deleted

## 2014-09-02 NOTE — Telephone Encounter (Signed)
Patient no showed today's appt. Please advise on how to follow up. °A. No follow up necessary. °B. Follow up urgent. Contact patient immediately. °C. Follow up necessary. Contact patient and schedule visit in ___ days. °D. Follow up advised. Contact patient and schedule visit in ____weeks. ° °

## 2014-09-02 NOTE — Telephone Encounter (Signed)
Letter mailed

## 2014-09-11 ENCOUNTER — Ambulatory Visit: Payer: Medicare Other | Admitting: Endocrinology

## 2014-09-12 ENCOUNTER — Ambulatory Visit (INDEPENDENT_AMBULATORY_CARE_PROVIDER_SITE_OTHER): Payer: Medicare Other | Admitting: Neurology

## 2014-09-12 ENCOUNTER — Encounter: Payer: Self-pay | Admitting: Neurology

## 2014-09-12 VITALS — BP 132/84 | HR 91 | Resp 16 | Ht 63.0 in | Wt 227.0 lb

## 2014-09-12 DIAGNOSIS — G40209 Localization-related (focal) (partial) symptomatic epilepsy and epileptic syndromes with complex partial seizures, not intractable, without status epilepticus: Secondary | ICD-10-CM | POA: Diagnosis not present

## 2014-09-12 DIAGNOSIS — F068 Other specified mental disorders due to known physiological condition: Secondary | ICD-10-CM | POA: Diagnosis not present

## 2014-09-12 DIAGNOSIS — F09 Unspecified mental disorder due to known physiological condition: Secondary | ICD-10-CM

## 2014-09-12 NOTE — Progress Notes (Signed)
NEUROLOGY CONSULTATION NOTE  Tammy Arellano MRN: [id number] DOB: 1965-08-15  Referring provider: Dr. Colin Benton Primary care provider: Dr. Colin Benton  Reason for consult:  seizures  Dear Dr Maudie Mercury:  Thank you for your kind referral of Tammy Arellano for consultation of the above symptoms. Although her history is well known to you, please allow me to reiterate it for the purpose of our medical record. The patient was accompanied to the clinic by her mother who also provides collateral information. Records and images were personally reviewed where available.  HISTORY OF PRESENT ILLNESS: This is a pleasant 49 year old left-handed woman with a history of diabetes, hypertension, hyperlipidemia, and seizures since age 49. Her mother reports she was found at a bus stop with the first seizure. She has had seizures without prior warning, other times she would feel tired, dizzy, and confused, then people would tell her she was shaking and unresponsive. She has had nocturnal seizures as well, where her mother would hear her making funny gurgling sounds. Her mother has witnessed that a week prior to having a convulsion, she would repeat things, have a blank stare. Last convulsion was 07/01/14, her mother heard a sound in the utility room and found her on the floor. Two weeks ago, she had a seizure at church where her sister reported she had a "goofy look," unresponsive, with no convulsive activity. She was amnestic of this. She denies any post-ictal focal weakness, usually she feels tired and drunk ("I can't walk straight"). Her longest seizure-free interval was 2 years. On review of prior records on [facility], she was on Depakote which caused weight gain and low white count. Her mother reports she had swelling with Dilantin. She has been on Keppra for many years, and switched from Depakote to Lamictal for at least 6 years. She has some problems with medication compliance, with seizures occurring when she misses  medications, however she reports having breakthrough seizures even when taking medication regularly.   She carries a diagnosis of pseudoseizures as well. On review of records, in 2009, she was reported to have a pseudoseizure during EEG setup, witnessed by EEG technician. She was described as complaining of head hurting with room spinning and dizziness, during the episode of body shaking and flapping at her side, she bit her tongue, lasting less than 5 minutes. She was immediately conversing after the event. EEG was within normal limits. There is an EEG report by Dr. Erling Cruz from 2007 with abnormal photic response with high-amplitude sharp and slow wave activity noticed synchronously in the temporal and occipital regions. No abnormality seen outside of photic stimulation. No MRI studies available for review. I personally reviewed head CT without contrast done 08/25/2014 which was unremarkable.  She was born with forceps delivery and had ?scalp hematoma. She had delay with motor and speech milestones and was in special education classes, diagnosed with a learning disability. She is unemployed and does not drive. She lives with her mother. She denies any olfactory/gustatory hallucinations, deja vu, rising epigastric sensation, focal numbness/tingling/weakness, myoclonic jerks. Her mother, however, reports body jerks.   Epilepsy Risk Factors:  Her maternal grandfather had seizures, her father started having seizures in his 47s. She was delivered via forceps delivery and has mild cognitive impairment and delay with milestones. Otherwise there is no history of febrile convulsions, CNS infections such as meningitis/encephalitis, significant traumatic brain injury, neurosurgical procedures  Prior AEDs: Dilantin, Depakote Laboratory Data:  Lab Results  Component Value Date  WBC 3.7* 08/25/2014   HGB 14.6 08/25/2014   HCT 43.0 08/25/2014   MCV 81.3 08/25/2014   PLT 189 08/25/2014     Chemistry      Component  Value Date/Time   NA 136 08/29/2014 1103   NA 136 09/28/2013 0818   K 4.4 08/29/2014 1103   CL 100 08/29/2014 1103   CO2 30 08/29/2014 1103   BUN 12 08/29/2014 1103   BUN 7 09/28/2013 0818   CREATININE 0.59 08/29/2014 1103      Component Value Date/Time   CALCIUM 9.3 08/29/2014 1103   ALKPHOS 83 09/28/2013 0818   AST 16 09/28/2013 0818   ALT 17 09/28/2013 0818   BILITOT 0.5 09/28/2013 0818      PAST MEDICAL HISTORY: Past Medical History  Diagnosis Date  . Seizures   . Diabetes mellitus   . Mental retardation   . Pseudoseizures   . Obesity   . Hypertension   . Dyslipidemia   . Asthma     PAST SURGICAL HISTORY: Past Surgical History  Procedure Laterality Date  . Breast reduction surgery    . Breast cyst resection    . Breast surgery      Breast reduction    MEDICATIONS: Current Outpatient Prescriptions on File Prior to Visit  Medication Sig Dispense Refill  . albuterol (PROVENTIL HFA;VENTOLIN HFA) 108 (90 BASE) MCG/ACT inhaler Inhale 2 puffs into the lungs every 6 (six) hours as needed.    Marland Kitchen amLODipine (NORVASC) 5 MG tablet Take 5 mg by mouth every morning.    . beclomethasone (QVAR) 80 MCG/ACT inhaler Inhale 2 puffs into the lungs 2 (two) times daily.    . Calcium Carbonate-Vitamin D (CALTRATE 600+D) 600-400 MG-UNIT per tablet Take 1 tablet by mouth daily.    . ferrous fumarate (HEMOCYTE - 106 MG FE) 325 (106 FE) MG TABS tablet Take 1 tablet by mouth.    . fexofenadine (ALLEGRA) 180 MG tablet Take 180 mg by mouth daily.    . insulin aspart (NOVOLOG FLEXPEN) 100 UNIT/ML FlexPen Inject 0-9 Units into the skin 3 (three) times daily with meals. Sliding scale    . insulin detemir (LEVEMIR) 100 unit/ml SOLN Inject 30 Units into the skin every morning.     Marland Kitchen LAMICTAL 200 MG tablet TAKE 1 TABLET BY MOUTH TWICE DAILY 180 tablet 1  . levETIRAcetam (KEPPRA) 1000 MG tablet Take one and one half tablets each morning and take two tablets each evening 315 tablet 1  . Liraglutide  (VICTOZA) 18 MG/3ML SOPN Inject 0.6 mg into the skin for 7 days, then increase to 1.2 mg daily (Patient taking differently: 1.2 mg. ) 6 mL 2  . loratadine (CLARITIN) 10 MG tablet Take 10 mg by mouth daily.    . methocarbamol (ROBAXIN) 500 MG tablet Take 1 tablet (500 mg total) by mouth 2 (two) times daily. 15 tablet 0  . montelukast (SINGULAIR) 10 MG tablet Take 10 mg by mouth at bedtime.    . Multiple Vitamin (MULTIVITAMIN WITH MINERALS) TABS Take 1 tablet by mouth daily.    . naproxen (NAPROSYN) 500 MG tablet Take 1 tablet (500 mg total) by mouth 2 (two) times daily. (Patient taking differently: Take 500 mg by mouth 2 (two) times daily as needed. ) 30 tablet 0  . omeprazole (PRILOSEC) 20 MG capsule Take 20 mg by mouth daily.    Marland Kitchen oxyCODONE-acetaminophen (PERCOCET/ROXICET) 5-325 MG per tablet Take 2 tablets by mouth every 4 (four) hours as needed for severe  pain. 6 tablet 0  . pioglitazone-metformin (ACTOPLUS MET) 15-850 MG per tablet Take 1 tablet by mouth daily with breakfast.    . pravastatin (PRAVACHOL) 40 MG tablet Take 40 mg by mouth daily.     . vitamin C (ASCORBIC ACID) 500 MG tablet Take 500 mg by mouth daily.    . vitamin E 400 UNIT capsule Take 400 Units by mouth daily.    . ondansetron (ZOFRAN) 4 MG tablet Take 1 tablet (4 mg total) by mouth every 6 (six) hours. (Patient not taking: Reported on 09/12/2014) 12 tablet 0  . promethazine (PHENERGAN) 12.5 MG tablet Take 12.5 mg by mouth every 6 (six) hours as needed for nausea or vomiting.     No current facility-administered medications on file prior to visit.    ALLERGIES: Allergies  Allergen Reactions  . Dilantin [Phenytoin Sodium Extended] Anaphylaxis    Induces seizures   . Furosemide Anaphylaxis  . Shellfish Allergy Anaphylaxis  . Depakote [Divalproex Sodium] Other (See Comments)    Leukopenia and weight gain  . Lip Balm [Chapstick] Other (See Comments)    Lips swell only chapstick brand  . Penicillins Nausea And Vomiting    . Latex Rash    FAMILY HISTORY: Family History  Problem Relation Age of Onset  . Osteoarthritis Mother   . Diabetes Mother   . Hypertension Mother   . Clotting disorder Mother     blood disorder  . Cancer Father     Lung  . Seizures Father   . Cancer Maternal Grandmother   . Heart attack Paternal Grandmother     SOCIAL HISTORY: Social History   Social History  . Marital Status: Single    Spouse Name: N/A  . Number of Children: 0  . Years of Education: 12   Occupational History  . Not on file.   Social History Main Topics  . Smoking status: Never Smoker   . Smokeless tobacco: Never Used  . Alcohol Use: No  . Drug Use: No  . Sexual Activity: No   Other Topics Concern  . Not on file   Social History Narrative   Patient is single with no children and resides with her mother.   Patient is left handed          REVIEW OF SYSTEMS: Constitutional: No fevers, chills, or sweats, no generalized fatigue, change in appetite Eyes: No visual changes, double vision, eye pain Ear, nose and throat: No hearing loss, ear pain, nasal congestion, sore throat Cardiovascular: No chest pain, palpitations Respiratory:  No shortness of breath at rest or with exertion, wheezes GastrointestinaI: No nausea, vomiting, diarrhea, abdominal pain, fecal incontinence Genitourinary:  No dysuria, urinary retention or frequency Musculoskeletal:  No neck pain, back pain Integumentary: No rash, pruritus, skin lesions Neurological: as above Psychiatric: No depression, insomnia, anxiety Endocrine: No palpitations, fatigue, diaphoresis, mood swings, change in appetite, change in weight, increased thirst Hematologic/Lymphatic:  No anemia, purpura, petechiae. Allergic/Immunologic: no itchy/runny eyes, nasal congestion, recent allergic reactions, rashes  PHYSICAL EXAM: Filed Vitals:   09/12/14 0826  BP: 132/84  Pulse: 91  Resp: 16   General: No acute distress Head:   Normocephalic/atraumatic Eyes: Fundoscopic exam shows bilateral sharp discs, no vessel changes, exudates, or hemorrhages Neck: supple, no paraspinal tenderness, full range of motion Back: No paraspinal tenderness Heart: regular rate and rhythm Lungs: Clear to auscultation bilaterally. Vascular: No carotid bruits. Skin/Extremities: No rash, no edema Neurological Exam: Mental status: alert and oriented to person, place, and time,  no dysarthria or aphasia, Fund of knowledge is appropriate.  Recent and remote memory are intact. 2/3 delayed recall.  Attention and concentration are normal.    Able to name objects and repeat phrases. Cranial nerves: CN I: not tested CN II: pupils equal, round and reactive to light, visual fields intact, fundi unremarkable. CN III, IV, VI:  full range of motion, no nystagmus, no ptosis CN V: facial sensation intact CN VII: upper and lower face symmetric CN VIII: hearing intact to finger rub CN IX, X: gag intact, uvula midline CN XI: sternocleidomastoid and trapezius muscles intact CN XII: tongue midline Bulk & Tone: normal, no fasciculations. Motor: 5/5 throughout with no pronator drift. Sensation: intact to light touch, cold, pin, vibration and joint position sense.  No extinction to double simultaneous stimulation.  Romberg test negative Deep Tendon Reflexes: brisk +3 left UE with +Hoffman sign, brisk +2 on right UE and bilateral patella, absent ankle jerks bilaterally, no ankle clonus Plantar responses: mute on left, downgoing on right Cerebellar: no incoordination on finger to nose, heel to shin. No dysdiadochokinesia Gait: narrow-based and steady, able to tandem walk adequately. Tremor: none  IMPRESSION: This is a pleasant 49 year old left-handed woman with a history of diabetes, hypertension, hyperlipidemia, mild cognitive impairment, and seizures since age 5. By semiology, family has witnessed her with blank stare, unresponsiveness for several minutes,  which may or may not progress to a convulsion, suggestive of localization-related epilepsy. A prior EEG in 2007 reported high-amplitude sharp and slow wave activity noticed synchronously in the temporal and occipital regions. Her neurological exam shows asymmetric reflexes over the left side. MRI brain with and without contrast seizure protocol and a 1-hour sleep deprived EEG will be ordered to further classify her seizures. There is note of a non-epileptic event in 2009, however the most recent seizures are suggestive of epileptic seizures. Most occur when she misses medications. Keppra and Lamictal levels will be ordered, continue current doses of Lamictal $RemoveBef'200mg'[id number]$  BID and Keppra 1500, $RemoveBefo'2000mg'BCDMcLemlBx$  for now. She does not drive. She will follow-up in 3 months and knows to call our office for any changes.   Thank you for allowing me to participate in the care of this patient. Please do not hesitate to call for any questions or concerns.   Ellouise Newer, M.D.  CC: Dr. Maudie Mercury

## 2014-09-12 NOTE — Patient Instructions (Signed)
1. Schedule MRI brain with and without contrast 2. Schedule 1-hour sleep deprived EEG 3. Continue Keppra and Lamictal 4. Bloodwork for Keppra and Lamictal levels 5. Follow-up in 3 months

## 2014-09-13 ENCOUNTER — Other Ambulatory Visit: Payer: Medicare Other

## 2014-09-13 ENCOUNTER — Ambulatory Visit: Payer: Medicare Other | Admitting: Neurology

## 2014-09-13 DIAGNOSIS — G40209 Localization-related (focal) (partial) symptomatic epilepsy and epileptic syndromes with complex partial seizures, not intractable, without status epilepticus: Secondary | ICD-10-CM | POA: Diagnosis not present

## 2014-09-13 DIAGNOSIS — F09 Unspecified mental disorder due to known physiological condition: Secondary | ICD-10-CM | POA: Insufficient documentation

## 2014-09-15 LAB — LEVETIRACETAM LEVEL: Keppra (Levetiracetam): 21.1 ug/mL

## 2014-09-15 LAB — LAMOTRIGINE LEVEL: LAMOTRIGINE LVL: 8.9 ug/mL (ref 4.0–18.0)

## 2014-09-16 ENCOUNTER — Ambulatory Visit (INDEPENDENT_AMBULATORY_CARE_PROVIDER_SITE_OTHER): Payer: Medicare Other | Admitting: Endocrinology

## 2014-09-16 VITALS — BP 140/88 | HR 92 | Temp 98.7°F | Ht 63.0 in | Wt 231.0 lb

## 2014-09-16 DIAGNOSIS — I10 Essential (primary) hypertension: Secondary | ICD-10-CM

## 2014-09-16 DIAGNOSIS — IMO0002 Reserved for concepts with insufficient information to code with codable children: Secondary | ICD-10-CM

## 2014-09-16 DIAGNOSIS — E1165 Type 2 diabetes mellitus with hyperglycemia: Secondary | ICD-10-CM | POA: Diagnosis not present

## 2014-09-16 LAB — LIPID PANEL: HDL: 66 mg/dL (ref 35–70)

## 2014-09-16 LAB — HEMOGLOBIN A1C
HEMOGLOBIN A1C: 8.6 % — AB (ref 4.0–6.0)
Hgb A1c MFr Bld: 8.6 % — AB (ref 4.0–6.0)

## 2014-09-16 MED ORDER — RAMIPRIL 5 MG PO CAPS: 5.0000 mg | ORAL_CAPSULE | Freq: Every day | ORAL | Status: DC

## 2014-09-16 MED ORDER — INSULIN ASPART 100 UNIT/ML FLEXPEN: 0.0000 [IU] | PEN_INJECTOR | Freq: Three times a day (TID) | SUBCUTANEOUS | Status: DC

## 2014-09-16 MED ORDER — LIRAGLUTIDE 18 MG/3ML ~~LOC~~ SOPN: PEN_INJECTOR | SUBCUTANEOUS | Status: DC

## 2014-09-16 MED ORDER — INSULIN DETEMIR 100 UNIT/ML FLEXPEN: 30.0000 [IU] | Freq: Every morning | SUBCUTANEOUS | Status: DC

## 2014-09-16 NOTE — Progress Notes (Signed)
Patient ID: Tammy Arellano, female   DOB: [birth year], 49 y.o.   MRN: [id number]           Reason for Appointment: Follow-up for Type 2 Diabetes  Referring physician: Tommy Medal  History of Present Illness:          Date of diagnosis of type 2 diabetes mellitus :        Background history:  Most likely she had been on oral hypoglycemic drugs for several years and this was switched to insulin about 10 years ago Not clear if she ever took any GLP-1 drugs, she does not remember taking Victoza Records available indicate that her A1c in 2015 was 12.1 in May and 9.5, 3 months later Her records indicate that she had been on various insulin regimens in the past including Lantus and 50/50 insulin as also Levemir Novolog  Recent history:   INSULIN regimen is described as: Levemir Insulin 30 hs   Novolog 9 ac tid      On her initial consultation was felt that she was having poor control of her diabetes related to not following her diet, some possible confusion about the types of insulin she was taking and also checking blood sugars very infrequently Also because of her difficulty with losing weight and going off her diet frequently she was started on Victoza She has taken 1.2 mg Victoza daily but she has not been able to lose any weight  It is not clear how often she is checking her blood sugars and see again did not bring her monitor for follow-up review  Current blood sugar patterns and problems identified:   Diet: She is still drinking occasional regular soft drinks; also not avoiding sweets consistently, is waiting for appointment with dietitian this month She is still eating unbalanced meals at breakfast and generally eating fruits and cereals and occasionally eggs  She states she is checking her blood sugars at different times but she is not clear how often  She was taking 10 units of Novolog before meals on the last visit but is taking only 9 units  Although she thinks her fasting  readings are sometimes fairly good it was high today for no reason.  She thinks she has been compliant with Levemir in the evening  Has not been able to lose weight even though she thinks she is walking daily now  She reports fairly consistent high readings after evening meal  Oral hypoglycemic drugs the patient is taking are: ?  Actoplusmet once a day.       Side effects from medications have been: None  Compliance with the medical regimen: fair Hypoglycemia:   none  Glucose monitoring:  done 1-2 times a day         Glucometer: Accucheck     Blood Glucose readings by recall:   Mean values apply above for all meters except median for One Touch  PRE-MEAL Fasting Lunch Dinner Bedtime Overall  Glucose range: 120- 210 200+ 120-130 210-215   Mean/median:         Self-care: The diet that the patient has been following is: none, may eat junk food     Typical meal intake: Breakfast is cereal, egg, usually 3 meals a day               Dietician visit, most recent: 9/14. CDE 5/16               Exercise: exercise bike at home, generally once a  week  Weight history:  Wt Readings from Last 3 Encounters:  09/16/14 231 lb (104.781 kg)  09/12/14 227 lb (102.967 kg)  07/17/14 228 lb (103.42 kg)    Glycemic control:   Lab Results  Component Value Date   HGBA1C 8.5* 08/29/2014   HGBA1C 12.3* 08/24/2011   Lab Results  Component Value Date   MICROALBUR 1.5 08/29/2014   LDLCALC 64 08/29/2014   CREATININE 0.59 08/29/2014         Medication List       This list is accurate as of: 09/16/14  8:49 AM.  Always use your most recent med list.               albuterol 108 (90 BASE) MCG/ACT inhaler  Commonly known as:  PROVENTIL HFA;VENTOLIN HFA  Inhale 2 puffs into the lungs every 6 (six) hours as needed.     amLODipine 5 MG tablet  Commonly known as:  NORVASC  Take 5 mg by mouth every morning.     beclomethasone 80 MCG/ACT inhaler  Commonly known as:  QVAR  Inhale 2 puffs into  the lungs 2 (two) times daily.     CALTRATE 600+D 600-400 MG-UNIT per tablet  Generic drug:  Calcium Carbonate-Vitamin D  Take 1 tablet by mouth daily.     ferrous fumarate 325 (106 FE) MG Tabs tablet  Commonly known as:  HEMOCYTE - 106 mg FE  Take 1 tablet by mouth.     fexofenadine 180 MG tablet  Commonly known as:  ALLEGRA  Take 180 mg by mouth daily.     insulin aspart 100 UNIT/ML FlexPen  Commonly known as:  NOVOLOG FLEXPEN  Inject 0-9 Units into the skin 3 (three) times daily with meals. Sliding scale     insulin detemir 100 unit/ml Soln  Commonly known as:  LEVEMIR  Inject 0.3 mLs (30 Units total) into the skin every morning.     LAMICTAL 200 MG tablet  Generic drug:  lamoTRIgine  TAKE 1 TABLET BY MOUTH TWICE DAILY     levETIRAcetam 1000 MG tablet  Commonly known as:  KEPPRA  Take one and one half tablets each morning and take two tablets each evening     Liraglutide 18 MG/3ML Sopn  Commonly known as:  VICTOZA  0.6  Mg into the skin per day     loratadine 10 MG tablet  Commonly known as:  CLARITIN  Take 10 mg by mouth daily.     methocarbamol 500 MG tablet  Commonly known as:  ROBAXIN  Take 1 tablet (500 mg total) by mouth 2 (two) times daily.     montelukast 10 MG tablet  Commonly known as:  SINGULAIR  Take 10 mg by mouth at bedtime.     multivitamin with minerals Tabs tablet  Take 1 tablet by mouth daily.     naproxen 500 MG tablet  Commonly known as:  NAPROSYN  Take 1 tablet (500 mg total) by mouth 2 (two) times daily.     omeprazole 20 MG capsule  Commonly known as:  PRILOSEC  Take 20 mg by mouth daily.     ondansetron 4 MG tablet  Commonly known as:  ZOFRAN  Take 1 tablet (4 mg total) by mouth every 6 (six) hours.     oxyCODONE-acetaminophen 5-325 MG per tablet  Commonly known as:  PERCOCET/ROXICET  Take 2 tablets by mouth every 4 (four) hours as needed for severe pain.     pioglitazone-metformin 15-850 MG  per tablet  Commonly known as:   ACTOPLUS MET  Take 1 tablet by mouth daily with breakfast.     pravastatin 40 MG tablet  Commonly known as:  PRAVACHOL  Take 40 mg by mouth daily.     promethazine 12.5 MG tablet  Commonly known as:  PHENERGAN  Take 12.5 mg by mouth every 6 (six) hours as needed for nausea or vomiting.     ramipril 5 MG capsule  Commonly known as:  ALTACE  Take 1 capsule (5 mg total) by mouth daily.     vitamin C 500 MG tablet  Commonly known as:  ASCORBIC ACID  Take 500 mg by mouth daily.     vitamin E 400 UNIT capsule  Take 400 Units by mouth daily.        Allergies:  Allergies  Allergen Reactions  . Dilantin [Phenytoin Sodium Extended] Anaphylaxis    Induces seizures   . Furosemide Anaphylaxis  . Shellfish Allergy Anaphylaxis  . Depakote [Divalproex Sodium] Other (See Comments)    Leukopenia and weight gain  . Lip Balm [Chapstick] Other (See Comments)    Lips swell only chapstick brand  . Penicillins Nausea And Vomiting  . Latex Rash    Past Medical History  Diagnosis Date  . Seizures   . Diabetes mellitus   . Mental retardation   . Pseudoseizures   . Obesity   . Hypertension   . Dyslipidemia   . Asthma     Past Surgical History  Procedure Laterality Date  . Breast reduction surgery    . Breast cyst resection    . Breast surgery      Breast reduction    Family History  Problem Relation Age of Onset  . Osteoarthritis Mother   . Diabetes Mother   . Hypertension Mother   . Clotting disorder Mother     blood disorder  . Cancer Father     Lung  . Seizures Father   . Cancer Maternal Grandmother   . Heart attack Paternal Grandmother     Social History:  reports that she has never smoked. She has never used smokeless tobacco. She reports that she does not drink alcohol or use illicit drugs.    Review of Systems   She had a seizure and he is being followed by neurologist   Lipid history: Last LDL was 64  She is taking pravastatin although this may have been  started in 4/16     Lab Results  Component Value Date   CHOL 129 08/29/2014   HDL 57.80 08/29/2014   LDLCALC 64 08/29/2014   TRIG 35.0 08/29/2014   CHOLHDL 2 08/29/2014            Most recent eye exam was in 2015   Hypertension: This is being treated with amlodipine, not on ACE inhibitor.  She has not established with her PCP  Last diabetic foot exam was in 05/2014   Physical Examination:  BP 140/88 mmHg  Pulse 92  Temp(Src) 98.7 F (37.1 C)  Ht $R'5\' 3"'EE$  (1.6 m)  Wt 231 lb (104.781 kg)  BMI 40.93 kg/m2  SpO2 98%    repeat blood pressure 144/88    ASSESSMENT:  Diabetes type 2, uncontrolled with BMI around 40  See history of present illness for detailed discussion of his current management, blood sugar patterns and problems identified Although she has started checking blood sugars she has not brought her monitor for download despite reminders Her glucose in the  lab was significantly high at 280; A1c is still high at 8.5 She still has difficulty losing weight despite using Victoza and starting a walking program She has taken her insulin regimen as directed but is getting only small amounts of mealtime insulin and she thinks her blood sugars are the highest after supper  PLAN:   Meal planning to be with dietitian  Increase Novolog at breakfast and supper  Increase Levemir to 34 and take it at suppertime  Bring blood sugar monitor for review on each visit, we will discuss in 6 weeks  Given examples of protein to have at breakfast instead of shredded wheat  Consistent diet with avoiding excess carbohydrate and simple sugars  Continue Victoza, consider increasing to 1.8, Will hold off now as she has occasional nausea  Start ramipril 5 mg daily for better blood pressure control and multiple benefits  Needs annual eye exams  Referral made to neurologist for seizure management, she is unable to get appointment with her present neurologist  Patient Instructions    Check blood sugars on waking up ..3  .. times a week Also check blood sugars about 2 hours after a meal and do this after different meals by rotation Recommended blood sugar levels on waking up is 90-130 and about 2 hours after meal is 140-180 Please bring blood sugar monitor to each visit.  LEVEMIR 34 units at SUPPER   Novolog 14 in am, 10 at lunch and 14 at supper  Avoid cereal in am and have low Calorie yogurt, cheese or egg for protein  Ramipril $RemoveB'5mg'IzKnmmQB$  daily, new    Counseling time on subjects discussed above is over 50% of today's 25 minute visit      Tammy Arellano 09/16/2014, 8:49 AM   Note: This office note was prepared with Estate agent. Any transcriptional errors that result from this process are unintentional.

## 2014-09-16 NOTE — Patient Instructions (Addendum)
Check blood sugars on waking up ..3  .. times a week Also check blood sugars about 2 hours after a meal and do this after different meals by rotation Recommended blood sugar levels on waking up is 90-130 and about 2 hours after meal is 140-180 Please bring blood sugar monitor to each visit.  LEVEMIR 34 units at SUPPER   Novolog 14 in am, 10 at lunch and 14 at supper  Avoid cereal in am and have low Calorie yogurt, cheese or egg for protein  Ramipril  daily, new

## 2014-09-18 ENCOUNTER — Telehealth: Payer: Self-pay | Admitting: Neurology

## 2014-09-18 ENCOUNTER — Telehealth: Payer: Self-pay | Admitting: Endocrinology

## 2014-09-18 ENCOUNTER — Encounter (HOSPITAL_COMMUNITY): Payer: Self-pay | Admitting: Emergency Medicine

## 2014-09-18 ENCOUNTER — Encounter: Payer: Self-pay | Admitting: *Deleted

## 2014-09-18 ENCOUNTER — Emergency Department (HOSPITAL_COMMUNITY): Payer: Medicare Other

## 2014-09-18 ENCOUNTER — Inpatient Hospital Stay (HOSPITAL_COMMUNITY)
Admission: EM | Admit: 2014-09-18 | Discharge: 2014-09-20 | DRG: 101 | Disposition: A | Discharge status: Home / Self Care | Payer: Medicare Other | Attending: Internal Medicine | Admitting: Internal Medicine

## 2014-09-18 DIAGNOSIS — Z9114 Patient's other noncompliance with medication regimen: Secondary | ICD-10-CM | POA: Diagnosis present

## 2014-09-18 DIAGNOSIS — I517 Cardiomegaly: Secondary | ICD-10-CM | POA: Diagnosis not present

## 2014-09-18 DIAGNOSIS — R251 Tremor, unspecified: Secondary | ICD-10-CM | POA: Diagnosis present

## 2014-09-18 DIAGNOSIS — R569 Unspecified convulsions: Secondary | ICD-10-CM

## 2014-09-18 DIAGNOSIS — E11649 Type 2 diabetes mellitus with hypoglycemia without coma: Secondary | ICD-10-CM | POA: Diagnosis present

## 2014-09-18 DIAGNOSIS — G40901 Epilepsy, unspecified, not intractable, with status epilepticus: Principal | ICD-10-CM | POA: Diagnosis present

## 2014-09-18 DIAGNOSIS — N390 Urinary tract infection, site not specified: Secondary | ICD-10-CM | POA: Diagnosis present

## 2014-09-18 DIAGNOSIS — K219 Gastro-esophageal reflux disease without esophagitis: Secondary | ICD-10-CM | POA: Diagnosis present

## 2014-09-18 DIAGNOSIS — Z794 Long term (current) use of insulin: Secondary | ICD-10-CM | POA: Diagnosis not present

## 2014-09-18 DIAGNOSIS — J4520 Mild intermittent asthma, uncomplicated: Secondary | ICD-10-CM

## 2014-09-18 DIAGNOSIS — I10 Essential (primary) hypertension: Secondary | ICD-10-CM | POA: Diagnosis present

## 2014-09-18 DIAGNOSIS — E119 Type 2 diabetes mellitus without complications: Secondary | ICD-10-CM

## 2014-09-18 DIAGNOSIS — E663 Overweight: Secondary | ICD-10-CM | POA: Diagnosis present

## 2014-09-18 DIAGNOSIS — E162 Hypoglycemia, unspecified: Secondary | ICD-10-CM | POA: Diagnosis present

## 2014-09-18 DIAGNOSIS — Z833 Family history of diabetes mellitus: Secondary | ICD-10-CM | POA: Diagnosis not present

## 2014-09-18 DIAGNOSIS — G3184 Mild cognitive impairment, so stated: Secondary | ICD-10-CM | POA: Diagnosis not present

## 2014-09-18 DIAGNOSIS — Z888 Allergy status to other drugs, medicaments and biological substances status: Secondary | ICD-10-CM

## 2014-09-18 DIAGNOSIS — J45909 Unspecified asthma, uncomplicated: Secondary | ICD-10-CM | POA: Diagnosis present

## 2014-09-18 DIAGNOSIS — Z7951 Long term (current) use of inhaled steroids: Secondary | ICD-10-CM

## 2014-09-18 DIAGNOSIS — J811 Chronic pulmonary edema: Secondary | ICD-10-CM | POA: Diagnosis not present

## 2014-09-18 DIAGNOSIS — Z6825 Body mass index (BMI) 25.0-25.9, adult: Secondary | ICD-10-CM | POA: Diagnosis not present

## 2014-09-18 DIAGNOSIS — Z79891 Long term (current) use of opiate analgesic: Secondary | ICD-10-CM

## 2014-09-18 DIAGNOSIS — Z880 Allergy status to penicillin: Secondary | ICD-10-CM | POA: Diagnosis not present

## 2014-09-18 DIAGNOSIS — R112 Nausea with vomiting, unspecified: Secondary | ICD-10-CM | POA: Diagnosis present

## 2014-09-18 DIAGNOSIS — Z91040 Latex allergy status: Secondary | ICD-10-CM | POA: Diagnosis not present

## 2014-09-18 DIAGNOSIS — E1165 Type 2 diabetes mellitus with hyperglycemia: Secondary | ICD-10-CM | POA: Diagnosis not present

## 2014-09-18 DIAGNOSIS — F79 Unspecified intellectual disabilities: Secondary | ICD-10-CM | POA: Diagnosis present

## 2014-09-18 DIAGNOSIS — F09 Unspecified mental disorder due to known physiological condition: Secondary | ICD-10-CM

## 2014-09-18 DIAGNOSIS — E785 Hyperlipidemia, unspecified: Secondary | ICD-10-CM | POA: Diagnosis not present

## 2014-09-18 DIAGNOSIS — G40909 Epilepsy, unspecified, not intractable, without status epilepticus: Secondary | ICD-10-CM

## 2014-09-18 DIAGNOSIS — Z79899 Other long term (current) drug therapy: Secondary | ICD-10-CM

## 2014-09-18 DIAGNOSIS — E11319 Type 2 diabetes mellitus with unspecified diabetic retinopathy without macular edema: Secondary | ICD-10-CM

## 2014-09-18 HISTORY — DX: Epilepsy, unspecified, not intractable, without status epilepticus: G40.909

## 2014-09-18 LAB — CREATININE, SERUM
Creatinine, Ser: 0.5 mg/dL (ref 0.44–1.00)
GFR calc non Af Amer: >60 mL/min (ref >60)

## 2014-09-18 LAB — CBC WITH DIFFERENTIAL/PLATELET
BASOS ABS: 0 10*3/uL (ref 0.0–0.1)
Basophils Relative: 1 % (ref 0–1)
Eosinophils Absolute: 0 10*3/uL (ref 0.0–0.7)
Eosinophils Relative: 1 % (ref 0–5)
HCT: 35.9 % — ABNORMAL LOW (ref 36.0–46.0)
HEMOGLOBIN: 11.9 g/dL — AB (ref 12.0–15.0)
Lymphocytes Relative: 27 % (ref 12–46)
Lymphs Abs: 1 10*3/uL (ref 0.7–4.0)
MCH: 27.2 pg (ref 26.0–34.0)
MCHC: 33.1 g/dL (ref 30.0–36.0)
MCV: 82 fL (ref 78.0–100.0)
Monocytes Absolute: 0.3 10*3/uL (ref 0.1–1.0)
Monocytes Relative: 8 % (ref 3–12)
NEUTROS ABS: 2.4 10*3/uL (ref 1.7–7.7)
NEUTROS PCT: 63 % (ref 43–77)
PLATELETS: 196 10*3/uL (ref 150–400)
RBC: 4.38 MIL/uL (ref 3.87–5.11)
RDW: 14.8 % (ref 11.5–15.5)
WBC: 3.7 10*3/uL — ABNORMAL LOW (ref 4.0–10.5)

## 2014-09-18 LAB — COMPREHENSIVE METABOLIC PANEL
ALT: 14 U/L (ref 14–54)
AST: 30 U/L (ref 15–41)
Albumin: 3.5 g/dL (ref 3.5–5.0)
Alkaline Phosphatase: 46 U/L (ref 38–126)
Anion gap: 7 (ref 5–15)
BUN: 8 mg/dL (ref 6–20)
CHLORIDE: 104 mmol/L (ref 101–111)
CO2: 28 mmol/L (ref 22–32)
Calcium: 9 mg/dL (ref 8.9–10.3)
Creatinine, Ser: 0.57 mg/dL (ref 0.44–1.00)
GFR calc Af Amer: >60 mL/min (ref >60)
Glucose, Bld: 64 mg/dL — ABNORMAL LOW (ref 65–99)
Potassium: 3.8 mmol/L (ref 3.5–5.1)
SODIUM: 139 mmol/L (ref 135–145)
Total Bilirubin: 1.2 mg/dL (ref 0.3–1.2)
Total Protein: 7.1 g/dL (ref 6.5–8.1)

## 2014-09-18 LAB — URINALYSIS, ROUTINE W REFLEX MICROSCOPIC
Bilirubin Urine: NEGATIVE
Glucose, UA: NEGATIVE mg/dL
Hgb urine dipstick: NEGATIVE
Ketones, ur: NEGATIVE mg/dL
LEUKOCYTES UA: NEGATIVE
Nitrite: NEGATIVE
PROTEIN: NEGATIVE mg/dL
Specific Gravity, Urine: 1.01 (ref 1.005–1.030)
Urobilinogen, UA: 0.2 mg/dL (ref 0.0–1.0)
pH: 7 (ref 5.0–8.0)

## 2014-09-18 LAB — CBC
HCT: 36.1 % (ref 36.0–46.0)
HEMOGLOBIN: 11.7 g/dL — AB (ref 12.0–15.0)
MCH: 26.7 pg (ref 26.0–34.0)
MCHC: 32.4 g/dL (ref 30.0–36.0)
MCV: 82.2 fL (ref 78.0–100.0)
Platelets: 178 10*3/uL (ref 150–400)
RBC: 4.39 MIL/uL (ref 3.87–5.11)
RDW: 14.7 % (ref 11.5–15.5)
WBC: 3.4 10*3/uL — ABNORMAL LOW (ref 4.0–10.5)

## 2014-09-18 LAB — CBG MONITORING, ED
GLUCOSE-CAPILLARY: 48 mg/dL — AB (ref 65–99)
Glucose-Capillary: 145 mg/dL — ABNORMAL HIGH (ref 65–99)
Glucose-Capillary: 152 mg/dL — ABNORMAL HIGH (ref 65–99)

## 2014-09-18 LAB — GLUCOSE, CAPILLARY
GLUCOSE-CAPILLARY: 60 mg/dL — AB (ref 65–99)
GLUCOSE-CAPILLARY: 94 mg/dL (ref 65–99)
GLUCOSE-CAPILLARY: 149 mg/dL — AB (ref 65–99)
GLUCOSE-CAPILLARY: 131 mg/dL — AB (ref 65–99)
Glucose-Capillary: 65 mg/dL (ref 65–99)

## 2014-09-18 LAB — RAPID URINE DRUG SCREEN, HOSP PERFORMED
AMPHETAMINES: NOT DETECTED
BARBITURATES: NOT DETECTED
Benzodiazepines: POSITIVE — AB
Cocaine: NOT DETECTED
OPIATES: NOT DETECTED
TETRAHYDROCANNABINOL: NOT DETECTED

## 2014-09-18 LAB — PHOSPHORUS: Phosphorus: 3.4 mg/dL (ref 2.5–4.6)

## 2014-09-18 LAB — TSH: TSH: 0.43 u[IU]/mL (ref 0.350–4.500)

## 2014-09-18 LAB — MAGNESIUM: Magnesium: 1.5 mg/dL — ABNORMAL LOW (ref 1.7–2.4)

## 2014-09-18 LAB — ETHANOL: Alcohol, Ethyl (B): <5 mg/dL (ref <5)

## 2014-09-18 LAB — I-STAT BETA HCG BLOOD, ED (MC, WL, AP ONLY): I-stat hCG, quantitative: <5 m[IU]/mL (ref <5)

## 2014-09-18 LAB — LIPASE, BLOOD: LIPASE: 21 U/L — AB (ref 22–51)

## 2014-09-18 MED ORDER — ONDANSETRON HCL 4 MG/2ML IJ SOLN: 4.0000 mg | Freq: Three times a day (TID) | INTRAMUSCULAR | Status: DC | PRN

## 2014-09-18 MED ORDER — SODIUM CHLORIDE 0.9 % IJ SOLN
3.0000 mL | Freq: Two times a day (BID) | INTRAMUSCULAR | Status: DC
  Administered 2014-09-18 – 2014-09-20 (×5): 3 mL via INTRAVENOUS

## 2014-09-18 MED ORDER — DEXTROSE 50 % IV SOLN
1.0000 | Freq: Once | INTRAVENOUS | Status: AC
  Administered 2014-09-18: 50 mL via INTRAVENOUS

## 2014-09-18 MED ORDER — LEVETIRACETAM 500 MG PO TABS
1000.0000 mg | ORAL_TABLET | Freq: Two times a day (BID) | ORAL | Status: DC
  Administered 2014-09-18 – 2014-09-19 (×2): 1000 mg via ORAL
  Filled 2014-09-18 (×2): qty 2

## 2014-09-18 MED ORDER — LORAZEPAM 2 MG/ML IJ SOLN
1.0000 mg | Freq: Once | INTRAMUSCULAR | Status: AC
  Administered 2014-09-18: 1 mg via INTRAVENOUS

## 2014-09-18 MED ORDER — LAMOTRIGINE 25 MG PO TABS
200.0000 mg | ORAL_TABLET | Freq: Two times a day (BID) | ORAL | Status: DC
  Filled 2014-09-18: qty 8

## 2014-09-18 MED ORDER — LEVETIRACETAM 500 MG/5ML IV SOLN
1500.0000 mg | Freq: Two times a day (BID) | INTRAVENOUS | Status: DC
  Filled 2014-09-18: qty 15

## 2014-09-18 MED ORDER — LEVETIRACETAM 500 MG PO TABS: 1000.0000 mg | ORAL_TABLET | Freq: Two times a day (BID) | ORAL | Status: DC

## 2014-09-18 MED ORDER — SODIUM CHLORIDE 0.9 % IV SOLN: 500.0000 mg | Freq: Once | INTRAVENOUS | Status: DC

## 2014-09-18 MED ORDER — LORAZEPAM 2 MG/ML IJ SOLN
INTRAMUSCULAR | Status: AC
  Filled 2014-09-18: qty 1

## 2014-09-18 MED ORDER — ACETAMINOPHEN 650 MG RE SUPP: 650.0000 mg | Freq: Four times a day (QID) | RECTAL | Status: DC | PRN

## 2014-09-18 MED ORDER — ACETAMINOPHEN 325 MG PO TABS: 650.0000 mg | ORAL_TABLET | Freq: Four times a day (QID) | ORAL | Status: DC | PRN

## 2014-09-18 MED ORDER — PRAVASTATIN SODIUM 40 MG PO TABS
40.0000 mg | ORAL_TABLET | Freq: Every day | ORAL | Status: DC
  Administered 2014-09-18 – 2014-09-19 (×2): 40 mg via ORAL
  Filled 2014-09-18 (×2): qty 1

## 2014-09-18 MED ORDER — ALBUTEROL SULFATE HFA 108 (90 BASE) MCG/ACT IN AERS: 2.0000 | INHALATION_SPRAY | Freq: Four times a day (QID) | RESPIRATORY_TRACT | Status: DC | PRN

## 2014-09-18 MED ORDER — OXYCODONE-ACETAMINOPHEN 5-325 MG PO TABS
1.0000 | ORAL_TABLET | Freq: Four times a day (QID) | ORAL | Status: DC | PRN
  Administered 2014-09-19: 1 via ORAL
  Filled 2014-09-18: qty 1

## 2014-09-18 MED ORDER — DEXTROSE 50 % IV SOLN
INTRAVENOUS | Status: AC
  Filled 2014-09-18: qty 50

## 2014-09-18 MED ORDER — KCL IN DEXTROSE-NACL 10-5-0.45 MEQ/L-%-% IV SOLN
INTRAVENOUS | Status: DC
  Administered 2014-09-18: 15:00:00 via INTRAVENOUS
  Filled 2014-09-18: qty 1000

## 2014-09-18 MED ORDER — LAMOTRIGINE 100 MG PO TABS
200.0000 mg | ORAL_TABLET | Freq: Two times a day (BID) | ORAL | Status: DC
  Administered 2014-09-18 – 2014-09-20 (×4): 200 mg via ORAL
  Filled 2014-09-18 (×4): qty 2
  Filled 2014-09-18 (×3): qty 8

## 2014-09-18 MED ORDER — LORAZEPAM 2 MG/ML IJ SOLN
1.0000 mg | Freq: Once | INTRAMUSCULAR | Status: AC
  Administered 2014-09-18: 1 mg via INTRAVENOUS
  Filled 2014-09-18: qty 1

## 2014-09-18 MED ORDER — PANTOPRAZOLE SODIUM 40 MG PO TBEC
40.0000 mg | DELAYED_RELEASE_TABLET | Freq: Two times a day (BID) | ORAL | Status: DC
  Administered 2014-09-18 – 2014-09-20 (×4): 40 mg via ORAL
  Filled 2014-09-18 (×4): qty 1

## 2014-09-18 MED ORDER — CIPROFLOXACIN IN D5W 400 MG/200ML IV SOLN
400.0000 mg | Freq: Two times a day (BID) | INTRAVENOUS | Status: DC
  Administered 2014-09-18 – 2014-09-19 (×2): 400 mg via INTRAVENOUS
  Filled 2014-09-18 (×2): qty 200

## 2014-09-18 MED ORDER — BUDESONIDE 0.25 MG/2ML IN SUSP
0.2500 mg | Freq: Two times a day (BID) | RESPIRATORY_TRACT | Status: DC
  Administered 2014-09-18 – 2014-09-20 (×4): 0.25 mg via RESPIRATORY_TRACT
  Filled 2014-09-18 (×4): qty 2

## 2014-09-18 MED ORDER — ENOXAPARIN SODIUM 40 MG/0.4ML ~~LOC~~ SOLN
40.0000 mg | SUBCUTANEOUS | Status: DC
  Administered 2014-09-18 – 2014-09-19 (×2): 40 mg via SUBCUTANEOUS
  Filled 2014-09-18 (×2): qty 0.4

## 2014-09-18 MED ORDER — SODIUM CHLORIDE 0.9 % IV SOLN
500.0000 mg | Freq: Once | INTRAVENOUS | Status: AC
  Administered 2014-09-18: 500 mg via INTRAVENOUS

## 2014-09-18 MED ORDER — LORAZEPAM 2 MG/ML IJ SOLN
2.0000 mg | Freq: Once | INTRAMUSCULAR | Status: AC
  Administered 2014-09-18: 2 mg via INTRAMUSCULAR

## 2014-09-18 MED ORDER — ONDANSETRON HCL 4 MG PO TABS: 4.0000 mg | ORAL_TABLET | Freq: Four times a day (QID) | ORAL | Status: DC | PRN

## 2014-09-18 MED ORDER — SODIUM CHLORIDE 0.9 % IV BOLUS (SEPSIS)
500.0000 mL | Freq: Once | INTRAVENOUS | Status: AC
  Administered 2014-09-18: 500 mL via INTRAVENOUS

## 2014-09-18 MED ORDER — RAMIPRIL 5 MG PO CAPS
5.0000 mg | ORAL_CAPSULE | Freq: Every day | ORAL | Status: DC
  Administered 2014-09-18 – 2014-09-20 (×3): 5 mg via ORAL
  Filled 2014-09-18 (×3): qty 1

## 2014-09-18 MED ORDER — ONDANSETRON HCL 4 MG/2ML IJ SOLN
4.0000 mg | Freq: Four times a day (QID) | INTRAMUSCULAR | Status: DC | PRN
  Administered 2014-09-19: 4 mg via INTRAVENOUS
  Filled 2014-09-18: qty 2

## 2014-09-18 MED ORDER — MAGNESIUM OXIDE 400 (241.3 MG) MG PO TABS
400.0000 mg | ORAL_TABLET | Freq: Every day | ORAL | Status: DC
  Administered 2014-09-18 – 2014-09-20 (×3): 400 mg via ORAL
  Filled 2014-09-18 (×3): qty 1

## 2014-09-18 MED ORDER — LORATADINE 10 MG PO TABS
10.0000 mg | ORAL_TABLET | Freq: Every day | ORAL | Status: DC
  Administered 2014-09-19 – 2014-09-20 (×2): 10 mg via ORAL
  Filled 2014-09-18 (×2): qty 1

## 2014-09-18 MED ORDER — SODIUM CHLORIDE 0.9 % IV SOLN
500.0000 mg | Freq: Once | INTRAVENOUS | Status: DC
  Filled 2014-09-18: qty 5

## 2014-09-18 MED ORDER — SODIUM CHLORIDE 0.9 % IV SOLN
500.0000 mg | Freq: Once | INTRAVENOUS | Status: AC
  Administered 2014-09-18: 500 mg via INTRAVENOUS
  Filled 2014-09-18: qty 5

## 2014-09-18 MED ORDER — MONTELUKAST SODIUM 10 MG PO TABS
10.0000 mg | ORAL_TABLET | Freq: Every day | ORAL | Status: DC
  Administered 2014-09-18 – 2014-09-19 (×2): 10 mg via ORAL
  Filled 2014-09-18 (×2): qty 1

## 2014-09-18 MED ORDER — DEXTROSE-NACL 5-0.9 % IV SOLN
Freq: Once | INTRAVENOUS | Status: AC
  Administered 2014-09-18: 50 mL/h via INTRAVENOUS

## 2014-09-18 MED ORDER — INSULIN ASPART 100 UNIT/ML ~~LOC~~ SOLN
0.0000 [IU] | Freq: Three times a day (TID) | SUBCUTANEOUS | Status: DC
  Administered 2014-09-19: 1 [IU] via SUBCUTANEOUS
  Administered 2014-09-19: 2 [IU] via SUBCUTANEOUS
  Administered 2014-09-20: 1 [IU] via SUBCUTANEOUS

## 2014-09-18 NOTE — ED Notes (Addendum)
Patient having 2nd active seizure in the ED.  PA and MD at bedside.

## 2014-09-18 NOTE — ED Notes (Addendum)
EMS - Patient woke up at 06:00 feeling normal with the exception of having a couple episodes of diarrhea in the past 24 hours.  Came back from walk, became nauseated and had a tonic/clonic seizure with mother present, was assisted to the floor.  Patient has had 3 seizures the morning.  Fire on scene had a CGB of 44, given instant glucose, rechecked to 70.  2.5mg  Versed with EMS, intranasal with EMS.  Postical with EMS after a couple of minutes and alert and oriented.    CBG in the ED 48.

## 2014-09-18 NOTE — Progress Notes (Signed)
NURSING PROGRESS NOTE  Tammy Arellano [id number] Admission Data: 09/18/2014 6:25 PM Attending Provider: Vassie Loll, MD PCP:KIM, Damita Lack., DO Code Status: Full Allergies:  Dilantin; Furosemide; Shellfish allergy; Depakote; Lip balm; Penicillins; and Latex Past Medical History:   has a past medical history of Mental retardation; Pseudoseizures; Obesity; Hypertension; Dyslipidemia; Asthma; Diabetes mellitus; and Epileptic seizures. Past Surgical History:   has past surgical history that includes Reduction mammaplasty (Bilateral, ~ 1988) and Breast cyst excision (Left, ~ 1988). Social History:   reports that she has never smoked. She has never used smokeless tobacco. She reports that she does not drink alcohol or use illicit drugs.  Tammy Arellano is a 49 y.o. female patient admitted from ED:   Last Documented Vital Signs: Blood pressure 101/53, pulse 80, temperature 97.8 F (36.6 C), temperature source Oral, resp. rate 18, SpO2 100 %.  Cardiac Monitoring: Box # 5 in place. Cardiac monitor yields:normal sinus rhythm.  IV Fluids:  IV in place, occlusive dsg intact without redness, IV cath antecubital left, condition patent and no redness D5W/0.45 NaCl/K+10MEQ/L  Skin: WDL  Patient/Family orientated to room. Information packet given to patient/family. Admission inpatient armband information verified with patient/family to include name and date of birth and placed on patient arm. Side rails up x 2, fall assessment and education completed with patient/family. Patient/family able to verbalize understanding of risk associated with falls and verbalized understanding to call for assistance before getting out of bed. Call light within reach. Patient/family able to voice and demonstrate understanding of unit orientation instructions.    Will continue to evaluate and treat per MD orders.   Leane Platt RN, BS, BSN

## 2014-09-18 NOTE — ED Notes (Signed)
5th witnessed seizure by this RN and PA.

## 2014-09-18 NOTE — Telephone Encounter (Signed)
No new insulin, Sx not from any of my Rx

## 2014-09-18 NOTE — Progress Notes (Addendum)
Hypoglycemic Event  CBG: 60  Treatment: 12 oz of grape juice  Symptoms: None  Follow-up CBG: Time: 1422 CBG Result: 94  Possible Reasons for Event: Unknown   Comments/MD notified: MD Gwenlyn Perking paged and aware    Leane Platt  Remember to initiate Hypoglycemia Order Set & complete

## 2014-09-18 NOTE — Telephone Encounter (Signed)
Pt mother called said they just took pt to [place] for seizures and they think she is having reaction to her new medication because she been vomiting , diarrhea  and having low blood sugars please call mother at [phone]  Home number [phone]

## 2014-09-18 NOTE — Progress Notes (Signed)
MD Gwenlyn Perking paged patient magnesium 1.5

## 2014-09-18 NOTE — Telephone Encounter (Signed)
Please see below, mother thinks is the new insulin you prescribed?

## 2014-09-18 NOTE — Telephone Encounter (Signed)
Noted, mother is aware 

## 2014-09-18 NOTE — ED Provider Notes (Signed)
CSN: [id number]     Arrival date & time 09/18/14  0902 History   First MD Initiated Contact with Patient 09/18/14 [phone]     Chief Complaint  Patient presents with  . Seizures     (Consider location/radiation/quality/duration/timing/severity/associated sxs/prior Treatment) Patient is a 49 y.o. female presenting with seizures. The history is provided by the patient and medical records. No language interpreter was used.  Seizures   Tammy Arellano is a 49 y.o. female  with a hx of seizures, IDDM, mental retardation, obesity presents to the Emergency Department complaining of seizure x2 at home, one tonic clonic witnessed by EMS and resolved with 2.$RemoveBeforeD'5mg'xurdxgbtCtIOPa$  Versed IN.  Pt with new diabetes medication (pioglitazone) begun on Tuesday.  Pt reports hypoglycemia in spite of sugary foods yesterday.  She reports CBG 115 this am before taking her medications.  Pt has returned to baseline.  She reports several days of intermittent diarrhea with several episodes of emesis in the last 24 hours including this morning prior to her seizure.  Last seizure was 1 week ago.  EMS reports pt's mother was out of town last week and there was questionable compliance with her Keppra.  Pt denies dysuria, hematuria, SOB, CP, cough.    EMS reports initial CBG was 44.  Pt was given oral glucose with a rise to 70.  EMS unable to obtain IV.  EMS reports no oral trauma or incontinence.      Past Medical History  Diagnosis Date  . Seizures   . Diabetes mellitus   . Mental retardation   . Pseudoseizures   . Obesity   . Hypertension   . Dyslipidemia   . Asthma    Past Surgical History  Procedure Laterality Date  . Breast reduction surgery    . Breast cyst resection    . Breast surgery      Breast reduction   Family History  Problem Relation Age of Onset  . Osteoarthritis Mother   . Diabetes Mother   . Hypertension Mother   . Clotting disorder Mother     blood disorder  . Cancer Father     Lung  . Seizures Father    . Cancer Maternal Grandmother   . Heart attack Paternal Grandmother    Social History  Substance Use Topics  . Smoking status: Never Smoker   . Smokeless tobacco: Never Used  . Alcohol Use: No   OB History    No data available     Review of Systems  Constitutional: Negative for fever, diaphoresis, appetite change, fatigue and unexpected weight change.  HENT: Negative for mouth sores.   Eyes: Negative for visual disturbance.  Respiratory: Negative for cough, chest tightness, shortness of breath and wheezing.   Cardiovascular: Negative for chest pain.  Gastrointestinal: Positive for nausea, vomiting and diarrhea. Negative for abdominal pain and constipation.  Endocrine: Negative for polydipsia, polyphagia and polyuria.  Genitourinary: Negative for dysuria, urgency, frequency and hematuria.  Musculoskeletal: Negative for back pain and neck stiffness.  Skin: Negative for rash.  Allergic/Immunologic: Negative for immunocompromised state.  Neurological: Positive for seizures. Negative for syncope, light-headedness and headaches.  Hematological: Does not bruise/bleed easily.  Psychiatric/Behavioral: Negative for sleep disturbance. The patient is not nervous/anxious.       Allergies  Dilantin; Furosemide; Shellfish allergy; Depakote; Lip balm; Penicillins; and Latex  Home Medications   Prior to Admission medications   Medication Sig Start Date End Date Taking? Authorizing Provider  albuterol (PROVENTIL HFA;VENTOLIN HFA) 108 (90 BASE)  MCG/ACT inhaler Inhale 2 puffs into the lungs every 6 (six) hours as needed.    Historical Provider, MD  amLODipine (NORVASC) 5 MG tablet Take 5 mg by mouth every morning.    Historical Provider, MD  beclomethasone (QVAR) 80 MCG/ACT inhaler Inhale 2 puffs into the lungs 2 (two) times daily.    Historical Provider, MD  Calcium Carbonate-Vitamin D (CALTRATE 600+D) 600-400 MG-UNIT per tablet Take 1 tablet by mouth daily.    Historical Provider, MD   ferrous fumarate (HEMOCYTE - 106 MG FE) 325 (106 FE) MG TABS tablet Take 1 tablet by mouth.    Historical Provider, MD  fexofenadine (ALLEGRA) 180 MG tablet Take 180 mg by mouth daily.    Historical Provider, MD  insulin aspart (NOVOLOG FLEXPEN) 100 UNIT/ML FlexPen Inject 0-9 Units into the skin 3 (three) times daily with meals. Sliding scale 09/16/14   [name], MD  insulin detemir (LEVEMIR) 100 unit/ml SOLN Inject 0.3 mLs (30 Units total) into the skin every morning. 09/16/14   [name], MD  LAMICTAL 200 MG tablet TAKE 1 TABLET BY MOUTH TWICE DAILY 03/03/14   [name], MD  levETIRAcetam (KEPPRA) 1000 MG tablet Take one and one half tablets each morning and take two tablets each evening 03/03/14   [name], MD  Liraglutide (VICTOZA) 18 MG/3ML SOPN 0.6  Mg into the skin per day 09/16/14   [name], MD  loratadine (CLARITIN) 10 MG tablet Take 10 mg by mouth daily.    Historical Provider, MD  methocarbamol (ROBAXIN) 500 MG tablet Take 1 tablet (500 mg total) by mouth 2 (two) times daily. 05/21/14   Hanna Patel-Mills, PA-C  montelukast (SINGULAIR) 10 MG tablet Take 10 mg by mouth at bedtime.    Historical Provider, MD  Multiple Vitamin (MULTIVITAMIN WITH MINERALS) TABS Take 1 tablet by mouth daily.    Historical Provider, MD  naproxen (NAPROSYN) 500 MG tablet Take 1 tablet (500 mg total) by mouth 2 (two) times daily. Patient taking differently: Take 500 mg by mouth 2 (two) times daily as needed.  05/21/14   Hanna Patel-Mills, PA-C  omeprazole (PRILOSEC) 20 MG capsule Take 20 mg by mouth daily.    Historical Provider, MD  ondansetron (ZOFRAN) 4 MG tablet Take 1 tablet (4 mg total) by mouth every 6 (six) hours. 07/01/14   Quintella Reichert, MD  oxyCODONE-acetaminophen (PERCOCET/ROXICET) 5-325 MG per tablet Take 2 tablets by mouth every 4 (four) hours as needed for severe pain. 05/21/14   Hanna Patel-Mills, PA-C  pioglitazone-metformin (ACTOPLUS MET) 15-850 MG per tablet Take 1 tablet by mouth  daily with breakfast.    Historical Provider, MD  pravastatin (PRAVACHOL) 40 MG tablet Take 40 mg by mouth daily.  06/10/14   Historical Provider, MD  promethazine (PHENERGAN) 12.5 MG tablet Take 12.5 mg by mouth every 6 (six) hours as needed for nausea or vomiting.    Historical Provider, MD  ramipril (ALTACE) 5 MG capsule Take 1 capsule (5 mg total) by mouth daily. 09/16/14   [name], MD  vitamin C (ASCORBIC ACID) 500 MG tablet Take 500 mg by mouth daily.    Historical Provider, MD  vitamin E 400 UNIT capsule Take 400 Units by mouth daily.    Historical Provider, MD   BP 132/75 mmHg  Pulse 94  Temp(Src) 97.9 F (36.6 C) (Oral)  Resp 18  SpO2 98% Physical Exam  Constitutional: She appears well-developed and well-nourished. No distress.  Awake, alert, nontoxic appearance  HENT:  Head: Normocephalic and atraumatic.  Mouth/Throat: Oropharynx is clear and moist. No oropharyngeal exudate.  Eyes: Conjunctivae are normal. No scleral icterus.  Neck: Normal range of motion. Neck supple.  Cardiovascular: Normal rate, regular rhythm and intact distal pulses.   Pulmonary/Chest: Effort normal and breath sounds normal. No respiratory distress. She has no wheezes.  Equal chest expansion  Abdominal: Soft. Bowel sounds are normal. She exhibits no mass. There is no tenderness. There is no rebound and no guarding.  Musculoskeletal: Normal range of motion. She exhibits no edema.  Neurological: She is alert.  Speech is clear and goal oriented Moves extremities without ataxia  Skin: Skin is warm and dry. She is not diaphoretic.  Psychiatric: She has a normal mood and affect.  Nursing note and vitals reviewed.   ED Course  Procedures (including critical care time) Labs Review Labs Reviewed  COMPREHENSIVE METABOLIC PANEL - Abnormal; Notable for the following:    Glucose, Bld 64 (*)    All other components within normal limits  CBC WITH DIFFERENTIAL/PLATELET - Abnormal; Notable for the following:     WBC 3.7 (*)    Hemoglobin 11.9 (*)    HCT 35.9 (*)    All other components within normal limits  LIPASE, BLOOD - Abnormal; Notable for the following:    Lipase 21 (*)    All other components within normal limits  CBG MONITORING, ED - Abnormal; Notable for the following:    Glucose-Capillary 48 (*)    All other components within normal limits  CBG MONITORING, ED - Abnormal; Notable for the following:    Glucose-Capillary 145 (*)    All other components within normal limits  CBG MONITORING, ED - Abnormal; Notable for the following:    Glucose-Capillary 152 (*)    All other components within normal limits  URINALYSIS, ROUTINE W REFLEX MICROSCOPIC (NOT AT [facility])  LEVETIRACETAM LEVEL  ETHANOL  URINE RAPID DRUG SCREEN, HOSP PERFORMED  I-STAT BETA HCG BLOOD, ED ([facility], [facility], [facility] ONLY)    Imaging Review Dg Chest Port 1 View  09/18/2014   CLINICAL DATA:  Seizures  EXAM: PORTABLE CHEST - 1 VIEW  COMPARISON:  06/19/2012  FINDINGS: The heart is mildly enlarged but stable. Moderate vascular congestion without overt pulmonary edema. No pleural effusions or definite infiltrates. The bony thorax is grossly intact.  IMPRESSION: Stable mild cardiac enlargement and moderate vascular congestion. No definite edema or effusions.   Electronically Signed   By: Marijo Sanes M.D.   On: 09/18/2014 10:24   I have personally reviewed and evaluated these images and lab results as part of my medical decision-making.   EKG Interpretation   Date/Time:  Wednesday September 18 2014 09:32:12 EDT Ventricular Rate:  86 PR Interval:  203 QRS Duration: 104 QT Interval:  378 QTC Calculation: 452 R Axis:   -63 Text Interpretation:  Sinus rhythm Borderline prolonged PR interval  Probable left atrial enlargement LAD, consider left anterior fascicular  block Abnormal R-wave progression, late transition No significant change  since last tracing Confirmed by Maryan Rued  MD, Loree Fee ([id number]) on 09/18/2014  10:33:37 AM        Angiocath insertion Performed by: Abigail Butts  Consent: Verbal consent obtained. Risks and benefits: risks, benefits and alternatives were discussed Time out: Immediately prior to procedure a "time out" was called to verify the correct patient, procedure, equipment, support staff and site/side marked as required.  Preparation: Patient was prepped and draped in the usual sterile fashion.  Vein Location: right hand  Not Ultrasound Guided  Gauge: 22  Normal blood return and flush without difficulty Patient tolerance: Patient tolerated the procedure well with no immediate complications.   Angiocath insertion Performed by: Abigail Butts  Consent: Verbal consent obtained. Risks and benefits: risks, benefits and alternatives were discussed Time out: Immediately prior to procedure a "time out" was called to verify the correct patient, procedure, equipment, support staff and site/side marked as required.  Preparation: Patient was prepped and draped in the usual sterile fashion.  Vein Location: left AC  Ultrasound Guided  Gauge: 20  Normal blood return and flush without difficulty Patient tolerance: Patient tolerated the procedure well with no immediate complications.   CRITICAL CARE Performed by: Abigail Butts Total critical care time: 1 hour Critical care time was exclusive of separately billable procedures and treating other patients. Critical care was necessary to treat or prevent imminent or life-threatening deterioration. Critical care was time spent personally by me on the following activities: development of treatment plan with patient and/or surrogate as well as nursing, discussions with consultants, evaluation of patient's response to treatment, examination of patient, obtaining history from patient or surrogate, ordering and performing treatments and interventions, ordering and review of laboratory studies, ordering and review of radiographic  studies, pulse oximetry and re-evaluation of patient's condition.   MDM   Final diagnoses:  Hypoglycemia  Seizure  Mild cognitive disorder   Tammy Arellano presents with seizure x2 PTA with hypoglycemia to 44 at home; 48 upon arrival in the ED.  Pt with tonic clonic seizure lasting 2 min during Hx and physical.  Pt given ativan $RemoveBefo'2mg'XhiZHRztlUV$  IM with cessation of seizure.  IV obtained by myself with push of D50.  CBG elevated to 145.  Mild oral trauma and incontinence of feces noted.   10:04 AM Pt with 3 seizures here in the ED without returning to baseline.  ? Status epilepticus.  Discussed with Leonel Ramsay who will evaluate.  Pt has already had Ativan $RemoveBe'4mg'AYKzmwNRH$  and Keppra $RemoveBe'500mg'CKOOlbmqL$  IV; will give 2nd $Remove'500mg'xHsNlqC$  bolus of Keppra.  Labs pending.    10:29 AM Pt with continued intermittent shaking, but this is more coordinated and generally more localized to her hands and feet.   Pt maintains her oxygen saturations throughout this time without tachycardia or tachypnea after these episodes.  This does not appear to be status, though she does not respond to verbal stimuli.    10:59 AM Pt evaluated by Etta Quill, PA-C and Dr. Leonel Ramsay.  Will continue with IV Keppra and admit with neuro consult.  Pt's more recent seizure activity in the ED is consistent with pseudoseizure.  No evidence of status at this time.  Pt is maintaining her airway with normal oxygen saturations.    11:29 AM Discussed with Dr. Dyann Kief of [facility] who will admit to tele.    BP 132/75 mmHg  Pulse 94  Temp(Src) 97.9 F (36.6 C) (Oral)  Resp 18  SpO2 98%  The patient was discussed with and seen by Dr. Maryan Rued who agrees with the treatment plan.   Jarrett Soho Debe Anfinson, PA-C 09/18/14 [place], MD 09/18/14 [id number]

## 2014-09-18 NOTE — ED Notes (Signed)
Attempted report 

## 2014-09-18 NOTE — Consult Note (Signed)
NEURO HOSPITALIST CONSULT NOTE   Referring physician: ED MD   Reason for Consult: break through seizure  HPI:                                                                                                                                          Tammy Arellano is an 49 y.o. female with a history of diabetes, hypertension, hyperlipidemia, and seizures since age 24. Her mother has witnessed that a week prior to having a convulsion, she would repeat things, have a blank stare. She recently was seen by Dr Delice Lesch on 09-12-2014. Per her note: "Last convulsion was 07/01/14, her mother heard a sound in the utility room and found her on the floor. Two weeks ago, she had a seizure at church where her sister reported she had a "goofy look," unresponsive, with no convulsive activity. She was amnestic of this. She denies any post-ictal focal weakness, usually she feels tired and drunk ("I can't walk straight"). Her longest seizure-free interval was 2 years."  She has been on Depakote with large weight gain and Dilantin which caused swelling. She has been on Keppra for many years and Lamictal for 6 years. There is known medication non-compliance.   In addition per Dr. Lars Masson note: She carries a diagnosis of pseudoseizures as well. On review of records, in 2009, she was reported to have a pseudoseizure during EEG setup, witnessed by EEG technician. She was described as complaining of head hurting with room spinning and dizziness, during the episode of body shaking and flapping at her side, she bit her tongue, lasting less than 5 minutes. She was immediately conversing after the event. EEG was within normal limits. There is an EEG report by Dr. Erling Cruz from 2007 with abnormal photic response with high-amplitude sharp and slow wave activity noticed synchronously in the temporal and occipital regions. No abnormality seen outside of photic stimulation.  Plan per Dr Delice Lesch was to obtain MRI brain with  and without contrast seizure protocol and a 1-hour sleep deprived EEG will be ordered to further classify her seizures.  Patient was started on Ramapril on 8/22.  She was brought to ED today due to having multiple seizures. On consultation she was noted to have bilateral arm shaking and turning her head from side to side.  When asked if she is having a seizure she responds with a yes.  She then states most of her seizures are like this. When pain illicite to finger on either hand her shaking will stop. At time she will only shake her left arm and states that this is how a seizure starts. Per PA-C in ED she had a prior episode that looked very realistic.   She has received 1 Gram of Keppra while in ED.  Past Medical History  Diagnosis Date  . Seizures   . Diabetes mellitus   . Mental retardation   . Pseudoseizures   . Obesity   . Hypertension   . Dyslipidemia   . Asthma     Past Surgical History  Procedure Laterality Date  . Breast reduction surgery    . Breast cyst resection    . Breast surgery      Breast reduction    Family History  Problem Relation Age of Onset  . Osteoarthritis Mother   . Diabetes Mother   . Hypertension Mother   . Clotting disorder Mother     blood disorder  . Cancer Father     Lung  . Seizures Father   . Cancer Maternal Grandmother   . Heart attack Paternal Grandmother      Social History:  reports that she has never smoked. She has never used smokeless tobacco. She reports that she does not drink alcohol or use illicit drugs.  Allergies  Allergen Reactions  . Dilantin [Phenytoin Sodium Extended] Anaphylaxis    Induces seizures   . Furosemide Anaphylaxis  . Shellfish Allergy Anaphylaxis  . Depakote [Divalproex Sodium] Other (See Comments)    Leukopenia and weight gain  . Lip Balm [Chapstick] Other (See Comments)    Lips swell only chapstick brand  . Penicillins Nausea And Vomiting  . Latex Rash    MEDICATIONS:                                                                                                                      No current facility-administered medications for this encounter.   Current Outpatient Prescriptions  Medication Sig Dispense Refill  . albuterol (PROVENTIL HFA;VENTOLIN HFA) 108 (90 BASE) MCG/ACT inhaler Inhale 2 puffs into the lungs every 6 (six) hours as needed.    Marland Kitchen amLODipine (NORVASC) 5 MG tablet Take 5 mg by mouth every morning.    . beclomethasone (QVAR) 80 MCG/ACT inhaler Inhale 2 puffs into the lungs 2 (two) times daily.    . Calcium Carbonate-Vitamin D (CALTRATE 600+D) 600-400 MG-UNIT per tablet Take 1 tablet by mouth daily.    . ferrous fumarate (HEMOCYTE - 106 MG FE) 325 (106 FE) MG TABS tablet Take 1 tablet by mouth.    . fexofenadine (ALLEGRA) 180 MG tablet Take 180 mg by mouth daily.    . insulin aspart (NOVOLOG FLEXPEN) 100 UNIT/ML FlexPen Inject 0-9 Units into the skin 3 (three) times daily with meals. Sliding scale 15 mL 1  . insulin detemir (LEVEMIR) 100 unit/ml SOLN Inject 0.3 mLs (30 Units total) into the skin every morning. 15 mL 1  . LAMICTAL 200 MG tablet TAKE 1 TABLET BY MOUTH TWICE DAILY 180 tablet 1  . levETIRAcetam (KEPPRA) 1000 MG tablet Take one and one half tablets each morning and take two tablets each evening 315 tablet 1  . Liraglutide (VICTOZA) 18 MG/3ML SOPN 0.6  Mg into the skin per day 6 mL 2  .  loratadine (CLARITIN) 10 MG tablet Take 10 mg by mouth daily.    . methocarbamol (ROBAXIN) 500 MG tablet Take 1 tablet (500 mg total) by mouth 2 (two) times daily. 15 tablet 0  . montelukast (SINGULAIR) 10 MG tablet Take 10 mg by mouth at bedtime.    . Multiple Vitamin (MULTIVITAMIN WITH MINERALS) TABS Take 1 tablet by mouth daily.    . naproxen (NAPROSYN) 500 MG tablet Take 1 tablet (500 mg total) by mouth 2 (two) times daily. (Patient taking differently: Take 500 mg by mouth 2 (two) times daily as needed. ) 30 tablet 0  . omeprazole (PRILOSEC) 20 MG capsule Take 20 mg by mouth  daily.    . ondansetron (ZOFRAN) 4 MG tablet Take 1 tablet (4 mg total) by mouth every 6 (six) hours. 12 tablet 0  . oxyCODONE-acetaminophen (PERCOCET/ROXICET) 5-325 MG per tablet Take 2 tablets by mouth every 4 (four) hours as needed for severe pain. 6 tablet 0  . pioglitazone-metformin (ACTOPLUS MET) 15-850 MG per tablet Take 1 tablet by mouth daily with breakfast.    . pravastatin (PRAVACHOL) 40 MG tablet Take 40 mg by mouth daily.     . promethazine (PHENERGAN) 12.5 MG tablet Take 12.5 mg by mouth every 6 (six) hours as needed for nausea or vomiting.    . ramipril (ALTACE) 5 MG capsule Take 1 capsule (5 mg total) by mouth daily. 90 capsule 1  . vitamin C (ASCORBIC ACID) 500 MG tablet Take 500 mg by mouth daily.    . vitamin E 400 UNIT capsule Take 400 Units by mouth daily.        ROS:                                                                                                                                       History obtained from the patient  General ROS: negative for - chills, fatigue, fever, night sweats, weight gain or weight loss Psychological ROS: negative for - behavioral disorder, hallucinations, memory difficulties, mood swings or suicidal ideation Ophthalmic ROS: negative for - blurry vision, double vision, eye pain or loss of vision ENT ROS: negative for - epistaxis, nasal discharge, oral lesions, sore throat, tinnitus or vertigo Allergy and Immunology ROS: negative for - hives or itchy/watery eyes Hematological and Lymphatic ROS: negative for - bleeding problems, bruising or swollen lymph nodes Endocrine ROS: negative for - galactorrhea, hair pattern changes, polydipsia/polyuria or temperature intolerance Respiratory ROS: negative for - cough, hemoptysis, shortness of breath or wheezing Cardiovascular ROS: negative for - chest pain, dyspnea on exertion, edema or irregular heartbeat Gastrointestinal ROS: negative for - abdominal pain, diarrhea, hematemesis,  nausea/vomiting or stool incontinence Genito-Urinary ROS: negative for - dysuria, hematuria, incontinence or urinary frequency/urgency Musculoskeletal ROS: negative for - joint swelling or muscular weakness Neurological ROS: as noted in HPI Dermatological ROS: negative for rash and skin lesion changes   Blood  pressure 135/67, pulse 97, temperature 97.9 F (36.6 C), temperature source Oral, resp. rate 20, SpO2 100 %.   Neurologic Examination:                                                                                                      HEENT-  Normocephalic, no lesions, without obvious abnormality.  Normal external eye and conjunctiva.  Normal TM's bilaterally.  Normal auditory canals and external ears. Normal external nose, mucus membranes and septum.  Normal pharynx. Cardiovascular- S1, S2 normal, pulses palpable throughout   Lungs- chest clear, no wheezing, rales, normal symmetric air entry Abdomen- normal findings: bowel sounds normal Extremities- no edema Lymph-no adenopathy palpable Musculoskeletal-no joint tenderness, deformity or swelling Skin-warm and dry, no hyperpigmentation, vitiligo, or suspicious lesions  Neurological Examination Mental Status: Alert, oriented to hospital and able to tell me that her seizures often start with bilateral arm jerking. Speech fluent without evidence of aphasia.  Able to follow commands without difficulty. Cranial Nerves: II: Discs flat bilaterally; Visual fields grossly normal, pupils equal, round, reactive to light and accommodation III,IV, VI: ptosis not present, extra-ocular motions intact bilaterally V,VII: smile symmetric, facial light touch sensation normal bilaterally VIII: hearing normal bilaterally IX,X: uvula rises symmetrically XI: bilateral shoulder shrug XII: midline tongue extension Motor: Able to raise all extremities antigravity but then allows it to quickly drop to the bed.  Sensory: Pinprick and light touch intact  throughout, bilaterally Deep Tendon Reflexes: 2+ and symmetric throughout UE No AJ and no KJ Plantars: Right: downgoing   Left: downgoing Cerebellar: No dysmetria noted with F-N and patient would not take part in H-s Shin due to weakness.  Gait: not tested due to multiple leads.       Lab Results: Basic Metabolic Panel: No results for input(s): NA, K, CL, CO2, GLUCOSE, BUN, CREATININE, CALCIUM, MG, PHOS in the last 168 hours.  Liver Function Tests: No results for input(s): AST, ALT, ALKPHOS, BILITOT, PROT, ALBUMIN in the last 168 hours. No results for input(s): LIPASE, AMYLASE in the last 168 hours. No results for input(s): AMMONIA in the last 168 hours.  CBC:  Recent Labs Lab 09/18/14 0930  WBC 3.7*  NEUTROABS 2.4  HGB 11.9*  HCT 35.9*  MCV 82.0  PLT 196    Cardiac Enzymes: No results for input(s): CKTOTAL, CKMB, CKMBINDEX, TROPONINI in the last 168 hours.  Lipid Panel: No results for input(s): CHOL, TRIG, HDL, CHOLHDL, VLDL, LDLCALC in the last 168 hours.  CBG:  Recent Labs Lab 09/18/14 0915 09/18/14 0937 09/18/14 0958  GLUCAP 48* 145* 152*    Microbiology: Results for orders placed or performed during the hospital encounter of 07/01/14  Urine culture     Status: None   Collection Time: 07/01/14  8:49 AM  Result Value Ref Range Status   Specimen Description URINE, CLEAN CATCH  Final   Special Requests ADDED [id number]  Final   Colony Count   Final    >=100,000 COLONIES/ML Performed at [facility]    Culture   Final    Multiple bacterial  morphotypes present, none predominant. Suggest appropriate recollection if clinically indicated. Performed at [facility]    Report Status 07/03/2014 FINAL  Final    Coagulation Studies: No results for input(s): LABPROT, INR in the last 72 hours.  Imaging: No results found.     Assessment and plan per attending neurologist  Etta Quill PA-C [facility] [phone]  09/18/2014, 10:20 AM   Assessment/Plan: 49 yo F with breakthrough seizures in the setting of possible noncompliance and throwing up of meds. The episode that I witnessed today was clearly non-epileptic, but reportedly from ER staff there was an event that looked more convincing earlier. She apparently had an abnormal EEG in the past and therefore would not stop AEDs.   1) Continue keppra and lamictal at home doses.  2) neurology will continue to follow.   Roland Rack, MD [facility] [phone]  If 7pm- 7am, please page neurology on call as listed in [place].

## 2014-09-18 NOTE — Progress Notes (Signed)
Received Report from [place].

## 2014-09-18 NOTE — H&P (Signed)
[facility] History and Physical  Tammy Arellano AXE:[id number] DOB: 22-Sep-1965 DOA: 09/18/2014  Referring physician: Dr. Theora Gianotti  PCP: Lucretia Kern., DO   Chief Complaint: seizure   HPI: Tammy Arellano is a 49 y.o. female with PMH significant for Seizure, pseudoseizure, HTN, obesity, dyslipidemia and type diabetes mellitus; presented to ED due to seizure activity. Patient apparently has back to back seizure episodes at home and then again in ED. Couple of this episodes were witnessed and consistent with tonic clonic activity, others more pseudoseizure and able to response/no post ictal during myoclonic activity. In ED she was found to be hypoglycemic and unable to maintain CBG's up despite D50 given. Patient endorses some N/V, suprapubic pain and dysuria, which in fact were avoiding her to be able to keep meds down; theses symptoms has been present for the last 36-48 hours.   Review of Systems:  Negative except as mentioned on HPI.  Past Medical History  Diagnosis Date  . Seizures   . Diabetes mellitus   . Mental retardation   . Pseudoseizures   . Obesity   . Hypertension   . Dyslipidemia   . Asthma    Past Surgical History  Procedure Laterality Date  . Breast reduction surgery    . Breast cyst resection    . Breast surgery      Breast reduction   Social History:  reports that she has never smoked. She has never used smokeless tobacco. She reports that she does not drink alcohol or use illicit drugs.  Allergies  Allergen Reactions  . Dilantin [Phenytoin Sodium Extended] Anaphylaxis    Induces seizures   . Furosemide Anaphylaxis  . Shellfish Allergy Anaphylaxis  . Depakote [Divalproex Sodium] Other (See Comments)    Leukopenia and weight gain  . Lip Balm [Chapstick] Other (See Comments)    Lips swell only chapstick brand  . Penicillins Nausea And Vomiting  . Latex Rash    Family History  Problem Relation Age of Onset  . Osteoarthritis Mother   .  Diabetes Mother   . Hypertension Mother   . Clotting disorder Mother     blood disorder  . Cancer Father     Lung  . Seizures Father   . Cancer Maternal Grandmother   . Heart attack Paternal Grandmother     Prior to Admission medications   Medication Sig Start Date End Date Taking? Authorizing Provider  albuterol (PROVENTIL HFA;VENTOLIN HFA) 108 (90 BASE) MCG/ACT inhaler Inhale 2 puffs into the lungs every 6 (six) hours as needed.    Historical Provider, MD  amLODipine (NORVASC) 5 MG tablet Take 5 mg by mouth every morning.    Historical Provider, MD  beclomethasone (QVAR) 80 MCG/ACT inhaler Inhale 2 puffs into the lungs 2 (two) times daily.    Historical Provider, MD  Calcium Carbonate-Vitamin D (CALTRATE 600+D) 600-400 MG-UNIT per tablet Take 1 tablet by mouth daily.    Historical Provider, MD  ferrous fumarate (HEMOCYTE - 106 MG FE) 325 (106 FE) MG TABS tablet Take 1 tablet by mouth.    Historical Provider, MD  fexofenadine (ALLEGRA) 180 MG tablet Take 180 mg by mouth daily.    Historical Provider, MD  insulin aspart (NOVOLOG FLEXPEN) 100 UNIT/ML FlexPen Inject 0-9 Units into the skin 3 (three) times daily with meals. Sliding scale 09/16/14   [name], MD  insulin detemir (LEVEMIR) 100 unit/ml SOLN Inject 0.3 mLs (30 Units total) into the skin every morning. 09/16/14   [name],  MD  LAMICTAL 200 MG tablet TAKE 1 TABLET BY MOUTH TWICE DAILY 03/03/14   [name], MD  levETIRAcetam (KEPPRA) 1000 MG tablet Take one and one half tablets each morning and take two tablets each evening 03/03/14   [name], MD  Liraglutide (VICTOZA) 18 MG/3ML SOPN 0.6  Mg into the skin per day 09/16/14   [name], MD  loratadine (CLARITIN) 10 MG tablet Take 10 mg by mouth daily.    Historical Provider, MD  methocarbamol (ROBAXIN) 500 MG tablet Take 1 tablet (500 mg total) by mouth 2 (two) times daily. 05/21/14   Hanna Patel-Mills, PA-C  montelukast (SINGULAIR) 10 MG tablet Take 10 mg by mouth at  bedtime.    Historical Provider, MD  Multiple Vitamin (MULTIVITAMIN WITH MINERALS) TABS Take 1 tablet by mouth daily.    Historical Provider, MD  naproxen (NAPROSYN) 500 MG tablet Take 1 tablet (500 mg total) by mouth 2 (two) times daily. Patient taking differently: Take 500 mg by mouth 2 (two) times daily as needed.  05/21/14   Hanna Patel-Mills, PA-C  omeprazole (PRILOSEC) 20 MG capsule Take 20 mg by mouth daily.    Historical Provider, MD  ondansetron (ZOFRAN) 4 MG tablet Take 1 tablet (4 mg total) by mouth every 6 (six) hours. 07/01/14   Quintella Reichert, MD  oxyCODONE-acetaminophen (PERCOCET/ROXICET) 5-325 MG per tablet Take 2 tablets by mouth every 4 (four) hours as needed for severe pain. 05/21/14   Hanna Patel-Mills, PA-C  pioglitazone-metformin (ACTOPLUS MET) 15-850 MG per tablet Take 1 tablet by mouth daily with breakfast.    Historical Provider, MD  pravastatin (PRAVACHOL) 40 MG tablet Take 40 mg by mouth daily.  06/10/14   Historical Provider, MD  promethazine (PHENERGAN) 12.5 MG tablet Take 12.5 mg by mouth every 6 (six) hours as needed for nausea or vomiting.    Historical Provider, MD  ramipril (ALTACE) 5 MG capsule Take 1 capsule (5 mg total) by mouth daily. 09/16/14   [name], MD  vitamin C (ASCORBIC ACID) 500 MG tablet Take 500 mg by mouth daily.    Historical Provider, MD  vitamin E 400 UNIT capsule Take 400 Units by mouth daily.    Historical Provider, MD   Physical Exam: Filed Vitals:   09/18/14 1200 09/18/14 1215 09/18/14 1245 09/18/14 1338  BP: 131/90 125/81  101/53  Pulse: 103 88  80  Temp:   97.5 F (36.4 C) 97.8 F (36.6 C)  TempSrc:   Oral Oral  Resp: $Remo'27 17  18  'BLtCB$ SpO2: 99% 97%  100%    Wt Readings from Last 3 Encounters:  09/16/14 104.781 kg (231 lb)  09/12/14 102.967 kg (227 lb)  07/17/14 103.42 kg (228 lb)    General:  Lethargic after ativan and loading keppra dose; but easily arouse and able to answer questions Eyes: PERRL, normal lids, irises & conjunctiva,  no nystagmus ENT: grossly normal hearing, no erythema, no exudates inside her mouth; no drainage out of her ears or nostrils  Neck: no LAD, masses or thyromegaly, no JVD Cardiovascular: RRR, positive SEM, no rubs or gallops. No LE edema. Telemetry: SR, no arrhythmias  Respiratory: CTA bilaterally, no w/r/r. Normal respiratory effort. Abdomen: soft, mild tenderness in suprapubic area, no distension, positive BS Skin: no rash or induration seen on exam Musculoskeletal: grossly normal tone BUE/BLE, FROM Psychiatric: no SI or hallucinations Neurologic: grossly non-focal. Limited, as patient was lethargic from medications given to abort seizure activity  Labs on Admission:  Basic Metabolic Panel:  Recent Labs Lab 09/18/14 0930  NA 139  K 3.8  CL 104  CO2 28  GLUCOSE 64*  BUN 8  CREATININE 0.57  CALCIUM 9.0   Liver Function Tests:  Recent Labs Lab 09/18/14 0930  AST 30  ALT 14  ALKPHOS 46  BILITOT 1.2  PROT 7.1  ALBUMIN 3.5    Recent Labs Lab 09/18/14 0930  LIPASE 21*   CBC:  Recent Labs Lab 09/18/14 0930  WBC 3.7*  NEUTROABS 2.4  HGB 11.9*  HCT 35.9*  MCV 82.0  PLT 196   CBG:  Recent Labs Lab 09/18/14 0915 09/18/14 0937 09/18/14 0958  GLUCAP 48* 145* 152*    Radiological Exams on Admission: Dg Chest Port 1 View  09/18/2014   CLINICAL DATA:  Seizures  EXAM: PORTABLE CHEST - 1 VIEW  COMPARISON:  06/19/2012  FINDINGS: The heart is mildly enlarged but stable. Moderate vascular congestion without overt pulmonary edema. No pleural effusions or definite infiltrates. The bony thorax is grossly intact.  IMPRESSION: Stable mild cardiac enlargement and moderate vascular congestion. No definite edema or effusions.   Electronically Signed   By: Marijo Sanes M.D.   On: 09/18/2014 10:24    EKG: RRR, no acute ischemic changes  Assessment/Plan 1-Seizure disorder: patient with hx of seizure and pseudoseizures in the past. Presented with witnessed  epeisodes at home by EMs and in ED. She has been loaded with keppra and received ativan as well. No active seizure appreciated during my examination. -will admit to telemetry  -continue current AED's at same dose -follow neurology recommendations -seizure precaution  -patient's seizure most likely triggered by UTI  2-UTI: patient complaining of suprapubic abd pain and burning sensation. She was tested in June and microscopy demonstrated > 100,000 colonies, but no specific microorganism. Patient reports symptoms including burning sensation during urination and suprapubic discomfort has worsening lately. denies fever -will check urine cx -empiric therapy with cipro initiated  3-DM2 (diabetes mellitus, type 2): meds recently adjusted and patient presenting with hypoglycemia -will use d5 1/2 NS infusion -will hold home hypoglycemic regimen -SSI sensitive to cover only for CBG's > 200  4-Essential hypertension, benign: -will continue home medication regimen -BP is currently stable  5-Dyslipidemia: will continue heart healthy diet -patient not on statins at home  6-Overweight (BMI 25.0-29.9): low calorie diet and exercise discussed with patient  7-Asthma, chronic: stable -will continue singulair, PRN albuterol and pulmicort (as substitution for Q-Var)  8-GERD (gastroesophageal reflux disease): will continue PPI  9-low magnesium: will replete  Neurology (Consult requested by ED physician)  Code Status:Full DVT Prophylaxis:Lovebnox  Family Communication: no family at bedside  Disposition Plan: inpatient, telemetry, LOS > 2 midnights   Time spent: 50 minutes  Barton Dubois [facility] Pager [phone]

## 2014-09-19 ENCOUNTER — Other Ambulatory Visit: Payer: Medicare Other

## 2014-09-19 ENCOUNTER — Ambulatory Visit: Payer: Medicare Other | Admitting: *Deleted

## 2014-09-19 DIAGNOSIS — E785 Hyperlipidemia, unspecified: Secondary | ICD-10-CM

## 2014-09-19 DIAGNOSIS — R569 Unspecified convulsions: Secondary | ICD-10-CM | POA: Diagnosis not present

## 2014-09-19 DIAGNOSIS — G40909 Epilepsy, unspecified, not intractable, without status epilepticus: Secondary | ICD-10-CM

## 2014-09-19 DIAGNOSIS — E162 Hypoglycemia, unspecified: Secondary | ICD-10-CM

## 2014-09-19 DIAGNOSIS — E119 Type 2 diabetes mellitus without complications: Secondary | ICD-10-CM | POA: Diagnosis not present

## 2014-09-19 DIAGNOSIS — E11649 Type 2 diabetes mellitus with hypoglycemia without coma: Secondary | ICD-10-CM | POA: Diagnosis not present

## 2014-09-19 DIAGNOSIS — I10 Essential (primary) hypertension: Secondary | ICD-10-CM

## 2014-09-19 DIAGNOSIS — G40901 Epilepsy, unspecified, not intractable, with status epilepticus: Secondary | ICD-10-CM | POA: Diagnosis not present

## 2014-09-19 DIAGNOSIS — E663 Overweight: Secondary | ICD-10-CM | POA: Diagnosis not present

## 2014-09-19 DIAGNOSIS — N390 Urinary tract infection, site not specified: Secondary | ICD-10-CM | POA: Diagnosis not present

## 2014-09-19 LAB — URINE CULTURE

## 2014-09-19 LAB — CBC
HCT: 34 % — ABNORMAL LOW (ref 36.0–46.0)
HEMOGLOBIN: 10.9 g/dL — AB (ref 12.0–15.0)
MCH: 26.7 pg (ref 26.0–34.0)
MCHC: 32.1 g/dL (ref 30.0–36.0)
MCV: 83.1 fL (ref 78.0–100.0)
PLATELETS: 168 10*3/uL (ref 150–400)
RBC: 4.09 MIL/uL (ref 3.87–5.11)
RDW: 14.8 % (ref 11.5–15.5)
WBC: 3.1 10*3/uL — ABNORMAL LOW (ref 4.0–10.5)

## 2014-09-19 LAB — BASIC METABOLIC PANEL
ANION GAP: 5 (ref 5–15)
BUN: <5 mg/dL — ABNORMAL LOW (ref 6–20)
CALCIUM: 7.9 mg/dL — AB (ref 8.9–10.3)
CO2: 25 mmol/L (ref 22–32)
CREATININE: 0.5 mg/dL (ref 0.44–1.00)
Chloride: 103 mmol/L (ref 101–111)
GLUCOSE: 470 mg/dL — AB (ref 65–99)
Potassium: 4.8 mmol/L (ref 3.5–5.1)
Sodium: 133 mmol/L — ABNORMAL LOW (ref 135–145)

## 2014-09-19 LAB — HEMOGLOBIN A1C
HEMOGLOBIN A1C: 8.7 % — AB (ref 4.8–5.6)
MEAN PLASMA GLUCOSE: 203 mg/dL

## 2014-09-19 LAB — GLUCOSE, CAPILLARY
GLUCOSE-CAPILLARY: 162 mg/dL — AB (ref 65–99)
GLUCOSE-CAPILLARY: 153 mg/dL — AB (ref 65–99)
Glucose-Capillary: 136 mg/dL — ABNORMAL HIGH (ref 65–99)
Glucose-Capillary: 165 mg/dL — ABNORMAL HIGH (ref 65–99)

## 2014-09-19 LAB — LEVETIRACETAM LEVEL: LEVETIRACETAM: NOT DETECTED ug/mL (ref 10.0–40.0)

## 2014-09-19 MED ORDER — INSULIN DETEMIR 100 UNIT/ML ~~LOC~~ SOLN
15.0000 [IU] | Freq: Every morning | SUBCUTANEOUS | Status: DC
  Administered 2014-09-19 – 2014-09-20 (×2): 15 [IU] via SUBCUTANEOUS
  Filled 2014-09-19 (×2): qty 0.15

## 2014-09-19 MED ORDER — LEVETIRACETAM 750 MG PO TABS
1500.0000 mg | ORAL_TABLET | Freq: Every morning | ORAL | Status: DC
  Administered 2014-09-20: 1500 mg via ORAL
  Filled 2014-09-19 (×2): qty 2

## 2014-09-19 MED ORDER — LEVETIRACETAM 750 MG PO TABS: 2000.0000 mg | ORAL_TABLET | Freq: Every evening | ORAL | Status: DC

## 2014-09-19 MED ORDER — LORAZEPAM 2 MG/ML IJ SOLN
1.0000 mg | INTRAMUSCULAR | Status: DC | PRN
  Administered 2014-09-19 (×2): 1 mg via INTRAVENOUS
  Filled 2014-09-19 (×2): qty 1

## 2014-09-19 NOTE — Progress Notes (Addendum)
PATIENT DETAILS Name: Tammy Arellano Age: 49 y.o. Sex: female Date of Birth: 1965-07-05 Admit Date: 09/18/2014 Admitting Physician No admitting provider for patient encounter. PCP:KIM, HANNAH R., DO  Subjective: Awake and alert-denies any nausea vomiting this morning. Evidence of? Seizure-like activity noted with physical therapy earlier today  Assessment/Plan: Principal Problem: Seizure disorder: With possible breakthrough seizure intermixed with pseudoseizure. Suspect etiology likely not taking medications due to vomiting. Recommendations from neurology are to place patient back on her home dosing of Keppra-per patient and family (mother and sister at bedside) patient on 1500 mg in a.m. and 2000 mg of Keppra in p.m.  Active Problems: Hypoglycemia: Likely secondary to poor oral intake and vomiting. Resumed Levemir at 15 units am, and 20 pm with SSI. Follow CBGs-if continues to be stable-suspect home tomorrow morning.  ? UTI: Doubt-UA negative. Denies any dysuria or polyuria to me this morning. Discontinue ciprofloxacin and monitor  DM2 (diabetes mellitus, type 2): See above  Essential hypertension: Stable, continue ramipril  Dyslipidemia: Continue with pravastatin.  GERD: Continue PPI  Disposition: Remain inpatient-home in am if CBG's stable  Antimicrobial agents  See below  Anti-infectives    Start     Dose/Rate Route Frequency Ordered Stop   09/18/14 1600  ciprofloxacin (CIPRO) IVPB 400 mg  Status:  Discontinued     400 mg 200 mL/hr over 60 Minutes Intravenous Every 12 hours 09/18/14 1403 09/19/14 0913      DVT Prophylaxis: Prophylactic Lovenox  Code Status: Full code  Family Communication Mother and sister at bedside  Procedures: None  CONSULTS:  neurology  Time spent 30 minutes-Greater than 50% of this time was spent in counseling, explanation of diagnosis, planning of further management, and coordination of  care.  MEDICATIONS: Scheduled Meds: . budesonide (PULMICORT) nebulizer solution  0.25 mg Nebulization BID  . enoxaparin (LOVENOX) injection  40 mg Subcutaneous Q24H  . insulin aspart  0-9 Units Subcutaneous TID WC  . insulin detemir  15 Units Subcutaneous q morning - 10a  . lamoTRIgine  200 mg Oral BID  . [START ON 09/20/2014] levETIRAcetam  1,500 mg Oral q morning - 10a  . levETIRAcetam  2,000 mg Oral QPM  . loratadine  10 mg Oral Daily  . magnesium oxide  400 mg Oral Daily  . montelukast  10 mg Oral QHS  . pantoprazole  40 mg Oral BID WC  . pravastatin  40 mg Oral q1800  . ramipril  5 mg Oral Daily  . sodium chloride  3 mL Intravenous Q12H   Continuous Infusions:  PRN Meds:.acetaminophen **OR** acetaminophen, LORazepam, ondansetron **OR** ondansetron (ZOFRAN) IV, oxyCODONE-acetaminophen    PHYSICAL EXAM: Vital signs in last 24 hours: Filed Vitals:   09/19/14 0226 09/19/14 0544 09/19/14 0753 09/19/14 1015  BP: 131/61 113/66  117/58  Pulse: 87 81  101  Temp: 98.3 F (36.8 C) 98.1 F (36.7 C)  97.9 F (36.6 C)  TempSrc: Oral Oral  Oral  Resp: 18 18  20   SpO2: 100% 99% 99% 100%    Weight change:  There were no vitals filed for this visit. There is no weight on file to calculate BMI.   Gen Exam: Awake and alert with clear speech.   Neck: Supple, No JVD.   Chest: B/L Clear.   CVS: S1 S2 Regular, no murmurs.  Abdomen: soft, BS +, non tender, non distended.  Extremities: no edema, lower extremities warm to  touch. Neurologic: Non Focal.   Skin: No Rash.   Wounds: N/A.    Intake/Output from previous day:  Intake/Output Summary (Last 24 hours) at 09/19/14 1406 Last data filed at 09/19/14 1022  Gross per 24 hour  Intake 833.33 ml  Output   1900 ml  Net -1066.67 ml     LAB RESULTS: CBC  Recent Labs Lab 09/18/14 0930 09/18/14 1823 09/19/14 0650  WBC 3.7* 3.4* 3.1*  HGB 11.9* 11.7* 10.9*  HCT 35.9* 36.1 34.0*  PLT 196 178 168  MCV 82.0 82.2 83.1  MCH  27.2 26.7 26.7  MCHC 33.1 32.4 32.1  RDW 14.8 14.7 14.8  LYMPHSABS 1.0  --   --   MONOABS 0.3  --   --   EOSABS 0.0  --   --   BASOSABS 0.0  --   --     Chemistries   Recent Labs Lab 09/18/14 0930 09/18/14 1545 09/19/14 0650  NA 139  --  133*  K 3.8  --  4.8  CL 104  --  103  CO2 28  --  25  GLUCOSE 64*  --  470*  BUN 8  --  <5*  CREATININE 0.57 0.50 0.50  CALCIUM 9.0  --  7.9*  MG  --  1.5*  --     CBG:  Recent Labs Lab 09/18/14 1728 09/18/14 2133 09/19/14 0234 09/19/14 0812 09/19/14 1139  GLUCAP 149* 131* 162* 136* 153*    GFR Estimated Creatinine Clearance: 101.8 mL/min (by C-G formula based on Cr of 0.5).  Coagulation profile No results for input(s): INR, PROTIME in the last 168 hours.  Cardiac Enzymes No results for input(s): CKMB, TROPONINI, MYOGLOBIN in the last 168 hours.  Invalid input(s): CK  Invalid input(s): POCBNP No results for input(s): DDIMER in the last 72 hours.  Recent Labs  09/18/14 1611  HGBA1C 8.7*   No results for input(s): CHOL, HDL, LDLCALC, TRIG, CHOLHDL, LDLDIRECT in the last 72 hours.  Recent Labs  09/18/14 1545  TSH 0.430   No results for input(s): VITAMINB12, FOLATE, FERRITIN, TIBC, IRON, RETICCTPCT in the last 72 hours.  Recent Labs  09/18/14 0930  LIPASE 21*    Urine Studies No results for input(s): UHGB, CRYS in the last 72 hours.  Invalid input(s): UACOL, UAPR, USPG, UPH, UTP, UGL, UKET, UBIL, UNIT, UROB, ULEU, UEPI, UWBC, URBC, UBAC, CAST, UCOM, BILUA  MICROBIOLOGY: Recent Results (from the past 240 hour(s))  Urine culture     Status: None   Collection Time: 09/18/14  2:27 PM  Result Value Ref Range Status   Specimen Description URINE, CLEAN CATCH  Final   Special Requests NONE  Final   Culture MULTIPLE SPECIES PRESENT, SUGGEST RECOLLECTION  Final   Report Status 09/19/2014 FINAL  Final    RADIOLOGY STUDIES/RESULTS: Ct Head Wo Contrast  08/25/2014   CLINICAL DATA:  Fatigue, sleepiness.  History of seizures. Developmentally delayed.  EXAM: CT HEAD WITHOUT CONTRAST  TECHNIQUE: Contiguous axial images were obtained from the base of the skull through the vertex without intravenous contrast.  COMPARISON:  08/24/2011  FINDINGS: Ventricles, cisterns and other CSF spaces are normal. There is no mass, mass effect, shift of midline structures or acute hemorrhage. There is no evidence of acute infarction. Remaining bones and soft tissues are within normal.  IMPRESSION: No acute intracranial findings.   Electronically Signed   By: Elberta Fortis M.D.   On: 08/25/2014 18:08   Dg Chest [facility]  09/18/2014   CLINICAL DATA:  Seizures  EXAM: PORTABLE CHEST - 1 VIEW  COMPARISON:  06/19/2012  FINDINGS: The heart is mildly enlarged but stable. Moderate vascular congestion without overt pulmonary edema. No pleural effusions or definite infiltrates. The bony thorax is grossly intact.  IMPRESSION: Stable mild cardiac enlargement and moderate vascular congestion. No definite edema or effusions.   Electronically Signed   By: Rudie Meyer M.D.   On: 09/18/2014 10:24    Jeoffrey Massed, MD  [facility] Pager:[phone]  If 7PM-7AM, please contact night-coverage [email or web address] Password [email or web address] 09/19/2014, 2:06 PM   LOS: 1 day

## 2014-09-19 NOTE — Care Management Note (Signed)
Case Management Note  Patient Details  Name: Tammy Arellano MRN: [id number] Date of Birth: 1965/03/22  Subjective/Objective:                 Patient lives at home with her parents. Patient has seizure disorder and relies on mother to assist in her care. Patient denies any difficulties obtaining meds, getting to MD office, or ADLs. Patient states she went for a walk and came back home and did not feel well, took her blood sugar was hypoglycemic, threw up blood, and had diarrhea. Patient resting comfortably now. Denies any CM assistance at this time.    Action/Plan:  Will continue to follow and offer resources and [facility] as needed.  Expected Discharge Date:                  Expected Discharge Plan:  Home/Self Care  In-House Referral:     Discharge planning Services  CM Consult  Post Acute Care Choice:    Choice offered to:     DME Arranged:    DME Agency:     HH Arranged:    HH Agency:     Status of Service:  In process, will continue to follow  Medicare Important Message Given:    Date Medicare IM Given:    Medicare IM give by:    Date Additional Medicare IM Given:    Additional Medicare Important Message give by:     If discussed at Long Length of Stay Meetings, dates discussed:    Additional Comments:  [name], RN 09/19/2014, 11:27 AM

## 2014-09-19 NOTE — Progress Notes (Signed)
[facility] notifed that pt had seizure like activity for approx . Received order for Ativan  IV after seizure requested the bedpan and voided . Pt stated that she only had a dry mouth. No other sensations vital signs WNL and charted in Epic. CBG 162. Ativan was given as ordered. Will continue to monitor. Ilean Skill LPN

## 2014-09-19 NOTE — Telephone Encounter (Signed)
The system shut down yesterday while writing the note. To follow up today... Upon calling to confirm Ms. Buckhalter's sleep deprived EEG her sister answered her phone, informed me Ms. Mcfall was in the hospital. I told her I hope she gets better soon and we will arrange the EEG when she is feeling better. She asked me if I can go to the hospital to do the EEG and I told her no that is not possible. She asked for [place] to arrange her sister to have an EEG while there. I told her that is not possible because we did not admit her and that her doctor's who are taking care of her have a plan of care that is appropriate to her current situation. She asked if I will ask Dr. Karel Jarvis to call her doctors at the hospital and I told her that would not be possible that is not acceptable for a physician to call another and tell them how to take care of a patient. I did tell her I will inform Dr. Karel Jarvis immediately of her sister's hospitalization and I did. I assured her the physicians will plan her sister's care for what she has been admitted. Her sister said it was for seizures. I told her then the doctors may see it appropriate to order an inpatient EEG anyway. She was not happy with me. I reassured her I will let Dr. Karel Jarvis know of this conversation.

## 2014-09-19 NOTE — Progress Notes (Signed)
Subjective: Night nurse states she may have had one episode last night.  Likely pseudoseizure.  She described event as patient turning head side to side and fine tremor of arms while asking for pain medications and following commands.  patietn sitting up in bed and watching TV this morning.   Exam: Filed Vitals:   09/19/14 0544  BP: 113/66  Pulse: 81  Temp: 98.1 F (36.7 C)  Resp: 18    HEENT-  Normocephalic, no lesions, without obvious abnormality.  Normal external eye and conjunctiva.  Normal TM's bilaterally.  Normal auditory canals and external ears. Normal external nose, mucus membranes and septum.  Normal pharynx. Cardiovascular- S1, S2 normal, pulses palpable throughout   Lungs- no tachypnea, retractions or cyanosis, Heart exam - S1, S2 normal, no murmur, no gallop, rate regular Abdomen- normal findings: bowel sounds normal Extremities- no edema     Gen: In bed, NAD MS: AOX4, follows all commands, conversive with clear speech ZO:XWRUEA, EOMI, TML face equal and symmetrical, vision intact throught Motor: moving all extremities FROM and 5/5 Sensory:intact throughout   Pertinent Labs: BG 470 A1c 8.7  Felicie Morn PA-C [facility] [phone]  Impression: break through seizure intermixed with pseudoseizure.  Etiology likely due to emesis and not getting full amount of oral AED.    Recommendations: 1) place back on PO keppra at home dose.   2) Glucose control  Neurology S/O    09/19/2014, 9:03 AM

## 2014-09-19 NOTE — Evaluation (Signed)
Physical Therapy Evaluation Patient Details Name: Tammy Arellano MRN: [id number] DOB: 1965-09-21 Today's Date: 09/19/2014   History of Present Illness  Patient is a 49 yo female admitted 09/18/14 with seizures.  PMH:  Seizure, mental retardation, HTN, obesity, DM, asthma  Clinical Impression  Patient presents with problems listed below.  Will benefit from acute PT to maximize functional independence prior to discharge home with family.  Patient with slightly unsteady gait.  May benefit from RW for safety during mobility.  Patient had seizure during ambulation with PT.  Patient assisted to chair and RN's called to room.  Patient was able to respond to PT during seizure, and was able to maintain weight bearing with support while being assisted to chair.  Seizure lasted approximately 3 minutes.  Patient able to perform stand pivot transfer chair to bed with min assist.    Follow Up Recommendations No PT follow up;Supervision/Assistance - 24 hour    Equipment Recommendations  Rolling walker with 5" wheels    Recommendations for Other Services       Precautions / Restrictions Precautions Precautions: Fall Precaution Comments: Seizures Restrictions Weight Bearing Restrictions: No      Mobility  Bed Mobility Overal bed mobility: Independent                Transfers Overall transfer level: Needs assistance Equipment used: None Transfers: Sit to/from Stand;Stand Pivot Transfers Sit to Stand: Min guard Stand pivot transfers: Min assist       General transfer comment: Patient stood from bed with min guard assist for safety.   Min assist to transfer chair to bed following seizure.  Ambulation/Gait Ambulation/Gait assistance: Min assist Ambulation Distance (Feet): 6 Feet Assistive device: None Gait Pattern/deviations: Step-through pattern;Decreased step length - right;Decreased step length - left;Decreased stride length;Shuffle;Staggering left;Staggering right Gait  velocity: Decreased Gait velocity interpretation: Below normal speed for age/gender General Gait Details: Patient with unsteady gait, staggering to both sides.  Was able to self-correct.  At about 6', patient reports "I feel a seizure coming on".  Attempted to turn with patient to return back to bed.  Patient's UEs began shaking and knees became unstable.  Provided support to patient under her arms.  Patient was able to continue to support herself on LE's.  Slid chair close to patient and assisted her to sit down - patient reached back for chair with RUE.  Patient able to maintain head control.  Was able to respond to PT during seizure.  Called RN's to room immediately.  Seizure lasted approximately 3 minutes.  Slid chair close to bed for transfer back to bed with min assist.  Stairs            Wheelchair Mobility    Modified Rankin (Stroke Patients Only)       Balance Overall balance assessment: Needs assistance Sitting-balance support: No upper extremity supported;Feet supported Sitting balance-Leahy Scale: Good     Standing balance support: No upper extremity supported;During functional activity Standing balance-Leahy Scale: Fair Standing balance comment: Unsteady during dynamic activities                             Pertinent Vitals/Pain Pain Assessment: No/denies pain    Home Living Family/patient expects to be discharged to:: Private residence Living Arrangements: Parent Available Help at Discharge: Family;Available 24 hours/day Type of Home: Apartment Home Access: Level entry     Home Layout: One level Home Equipment: Shower seat  Prior Function Level of Independence: Independent         Comments: Does not drive     Hand Dominance        Extremity/Trunk Assessment   Upper Extremity Assessment: Overall WFL for tasks assessed           Lower Extremity Assessment: Generalized weakness         Communication   Communication: No  difficulties  Cognition Arousal/Alertness: Awake/alert Behavior During Therapy: WFL for tasks assessed/performed;Flat affect Overall Cognitive Status: History of cognitive impairments - at baseline                      General Comments      Exercises        Assessment/Plan    PT Assessment Patient needs continued PT services  PT Diagnosis Abnormality of gait;Difficulty walking;Generalized weakness;Altered mental status   PT Problem List Decreased strength;Decreased activity tolerance;Decreased balance;Decreased mobility;Decreased knowledge of use of DME;Obesity  PT Treatment Interventions DME instruction;Gait training;Functional mobility training;Therapeutic activities;Balance training;Patient/family education   PT Goals (Current goals can be found in the Care Plan section) Acute Rehab PT Goals Patient Stated Goal: None stated PT Goal Formulation: With patient Time For Goal Achievement: 09/26/14 Potential to Achieve Goals: Good    Frequency Min 3X/week   Barriers to discharge        Co-evaluation               End of Session Equipment Utilized During Treatment: Gait belt Activity Tolerance: Treatment limited secondary to medical complications (Comment) (Seizure during session) Patient left: in bed;with call bell/phone within reach;with nursing/sitter in room Nurse Communication: Mobility status (Seizure)         Time: 9604-5409 PT Time Calculation (min) (ACUTE ONLY): 25 min   Charges:   PT Evaluation $Initial PT Evaluation Tier I: 1 Procedure PT Treatments $Therapeutic Activity: 8-22 mins   PT G CodesVena Austria 2014-10-09, 12:28 PM Durenda Hurt. Renaldo Fiddler, [facility] Acute Rehab Services Pager [phone]

## 2014-09-19 NOTE — Progress Notes (Signed)
While working with Physical Therapy, pt had another seizure. Pt was shaking hands and head. When asked if she was okay, pt shook head "yes". Lasted about 3 minutes. Oxygen level was 100% on room air. Ghimire MD made aware. Ativan  given.

## 2014-09-19 NOTE — Progress Notes (Addendum)
Pt states she is having a seizure at this time. Patient shaking her arms and head. Pt responds to questions and it alert at this time. Lasted about 5-10 minutes

## 2014-09-19 NOTE — Progress Notes (Signed)
Assumed care of pt at 15:20 from [place]. Pt has no complaints. No distress noted. Pt relaxed watching TV.

## 2014-09-20 ENCOUNTER — Ambulatory Visit (HOSPITAL_COMMUNITY): Admission: RE | Admit: 2014-09-20 | Payer: Medicare Other | Source: Ambulatory Visit

## 2014-09-20 DIAGNOSIS — R569 Unspecified convulsions: Secondary | ICD-10-CM | POA: Diagnosis not present

## 2014-09-20 DIAGNOSIS — N390 Urinary tract infection, site not specified: Secondary | ICD-10-CM | POA: Diagnosis not present

## 2014-09-20 DIAGNOSIS — E11649 Type 2 diabetes mellitus with hypoglycemia without coma: Secondary | ICD-10-CM | POA: Diagnosis not present

## 2014-09-20 DIAGNOSIS — G40901 Epilepsy, unspecified, not intractable, with status epilepticus: Secondary | ICD-10-CM | POA: Diagnosis not present

## 2014-09-20 DIAGNOSIS — I10 Essential (primary) hypertension: Secondary | ICD-10-CM | POA: Diagnosis not present

## 2014-09-20 DIAGNOSIS — E663 Overweight: Secondary | ICD-10-CM | POA: Diagnosis not present

## 2014-09-20 DIAGNOSIS — G40909 Epilepsy, unspecified, not intractable, without status epilepticus: Secondary | ICD-10-CM | POA: Diagnosis not present

## 2014-09-20 DIAGNOSIS — E162 Hypoglycemia, unspecified: Secondary | ICD-10-CM | POA: Diagnosis not present

## 2014-09-20 DIAGNOSIS — E785 Hyperlipidemia, unspecified: Secondary | ICD-10-CM | POA: Diagnosis not present

## 2014-09-20 DIAGNOSIS — E119 Type 2 diabetes mellitus without complications: Secondary | ICD-10-CM | POA: Diagnosis not present

## 2014-09-20 LAB — GLUCOSE, CAPILLARY
GLUCOSE-CAPILLARY: 140 mg/dL — AB (ref 65–99)
Glucose-Capillary: 134 mg/dL — ABNORMAL HIGH (ref 65–99)
Glucose-Capillary: 203 mg/dL — ABNORMAL HIGH (ref 65–99)

## 2014-09-20 MED ORDER — NAPROXEN 500 MG PO TABS: 500.0000 mg | ORAL_TABLET | Freq: Two times a day (BID) | ORAL | Status: DC | PRN

## 2014-09-20 MED ORDER — INSULIN DETEMIR 100 UNIT/ML ~~LOC~~ SOLN: 20.0000 [IU] | Freq: Every morning | SUBCUTANEOUS | Status: DC

## 2014-09-20 MED ORDER — INSULIN DETEMIR 100 UNIT/ML FLEXPEN: 20.0000 [IU] | Freq: Every morning | SUBCUTANEOUS | Status: DC

## 2014-09-20 MED ORDER — RAMIPRIL 5 MG PO CAPS: 5.0000 mg | ORAL_CAPSULE | Freq: Every day | ORAL | Status: DC

## 2014-09-20 NOTE — Progress Notes (Signed)
Utilization Review completed. Artia Singley RN BSN CM 

## 2014-09-20 NOTE — Progress Notes (Signed)
NURSING PROGRESS NOTE  Tammy Arellano [id number] Discharge Data: 09/20/2014 12:14 PM Attending Provider: Maretta Bees, MD PCP:No primary care provider on file.   [name] to be D/C'd Home with mother per MD order via wheelchair by volunteer services with hard copy of prescription.    All IV's will be discontinued and monitored for bleeding.  All belongings will be returned to patient for patient to take home.  Last Documented Vital Signs:  Blood pressure 135/73, pulse 76, temperature 98.2 F (36.8 C), temperature source Oral, resp. rate 18, SpO2 98 %.  Leane Platt RN, BS, BSN

## 2014-09-20 NOTE — Discharge Summary (Signed)
PATIENT DETAILS Name: Tammy Arellano Age: 49 y.o. Sex: female Date of Birth: [birth year] MRN: [id number]. Admitting Physician: No admitting provider for patient encounter. PCP:KIM, Damita Lack., DO  Admit Date: 09/18/2014 Discharge date: 09/20/2014  Recommendations for Outpatient Follow-up:  1. Optimize Insulin regimen 2. Ensure follow up with Endocrine-Dr Lucianne Muss  PRIMARY DISCHARGE DIAGNOSIS:  Principal Problem:   Seizure disorder Active Problems:   DM2 (diabetes mellitus, type 2)   Essential hypertension, benign   Hypoglycemia   Dyslipidemia   Overweight (BMI 25.0-29.9)   Asthma, chronic   GERD (gastroesophageal reflux disease)   UTI (urinary tract infection)   UTI (lower urinary tract infection)      PAST MEDICAL HISTORY: Past Medical History  Diagnosis Date  . Mental retardation   . Pseudoseizures   . Obesity   . Hypertension   . Dyslipidemia   . Asthma   . Diabetes mellitus   . Epileptic seizures     DISCHARGE MEDICATIONS: Current Discharge Medication List    CONTINUE these medications which have CHANGED   Details  insulin detemir (LEVEMIR) 100 unit/ml SOLN Inject 0.2 mLs (20 Units total) into the skin every morning.    naproxen (NAPROSYN) 500 MG tablet Take 1 tablet (500 mg total) by mouth 2 (two) times daily as needed.    ramipril (ALTACE) 5 MG capsule Take 1 capsule (5 mg total) by mouth daily. Qty: 30 capsule, Refills: 0      CONTINUE these medications which have NOT CHANGED   Details  albuterol (PROVENTIL HFA;VENTOLIN HFA) 108 (90 BASE) MCG/ACT inhaler Inhale 2 puffs into the lungs every 6 (six) hours as needed for wheezing.     beclomethasone (QVAR) 80 MCG/ACT inhaler Inhale 2 puffs into the lungs 2 (two) times daily.    Calcium Carbonate-Vitamin D (CALTRATE 600+D) 600-400 MG-UNIT per tablet Take 1 tablet by mouth daily.    ferrous fumarate (HEMOCYTE - 106 MG FE) 325 (106 FE) MG TABS tablet Take 1 tablet by mouth.    fexofenadine (ALLEGRA) 180  MG tablet Take 180 mg by mouth daily.    insulin aspart (NOVOLOG FLEXPEN) 100 UNIT/ML FlexPen Inject 0-9 Units into the skin 3 (three) times daily with meals. Sliding scale Qty: 15 mL, Refills: 1    LAMICTAL 200 MG tablet TAKE 1 TABLET BY MOUTH TWICE DAILY Qty: 180 tablet, Refills: 1    levETIRAcetam (KEPPRA) 1000 MG tablet Take one and one half tablets each morning and take two tablets each evening Qty: 315 tablet, Refills: 1    Liraglutide (VICTOZA) 18 MG/3ML SOPN 0.6  Mg into the skin per day Qty: 6 mL, Refills: 2    loratadine (CLARITIN) 10 MG tablet Take 10 mg by mouth daily.    montelukast (SINGULAIR) 10 MG tablet Take 10 mg by mouth at bedtime.    Multiple Vitamin (MULTIVITAMIN WITH MINERALS) TABS Take 1 tablet by mouth daily.    omeprazole (PRILOSEC) 20 MG capsule Take 20 mg by mouth daily.    ondansetron (ZOFRAN) 4 MG tablet Take 1 tablet (4 mg total) by mouth every 6 (six) hours. Qty: 12 tablet, Refills: 0    pioglitazone-metformin (ACTOPLUS MET) 15-850 MG per tablet Take 1 tablet by mouth daily with breakfast.    pravastatin (PRAVACHOL) 40 MG tablet Take 40 mg by mouth daily.     vitamin C (ASCORBIC ACID) 500 MG tablet Take 500 mg by mouth daily.    vitamin E 400 UNIT capsule Take 400 Units by mouth daily.  STOP taking these medications     amLODipine (NORVASC) 5 MG tablet      methocarbamol (ROBAXIN) 500 MG tablet      oxyCODONE-acetaminophen (PERCOCET/ROXICET) 5-325 MG per tablet      promethazine (PHENERGAN) 12.5 MG tablet         ALLERGIES:   Allergies  Allergen Reactions  . Dilantin [Phenytoin Sodium Extended] Anaphylaxis    Induces seizures   . Furosemide Anaphylaxis  . Shellfish Allergy Anaphylaxis  . Depakote [Divalproex Sodium] Other (See Comments)    Leukopenia and weight gain  . Lip Balm [Chapstick] Other (See Comments)    Lips swell only chapstick brand  . Penicillins Nausea And Vomiting  . Latex Rash    BRIEF HPI:  See H&P,  Labs, Consult and Test reports for all details in brief, patient is a 76 female who was brought in for evaluation of seizures and hypoglycemia.  CONSULTATIONS:   neurology  PERTINENT RADIOLOGIC STUDIES: Ct Head Wo Contrast  08/25/2014   CLINICAL DATA:  Fatigue, sleepiness. History of seizures. Developmentally delayed.  EXAM: CT HEAD WITHOUT CONTRAST  TECHNIQUE: Contiguous axial images were obtained from the base of the skull through the vertex without intravenous contrast.  COMPARISON:  08/24/2011  FINDINGS: Ventricles, cisterns and other CSF spaces are normal. There is no mass, mass effect, shift of midline structures or acute hemorrhage. There is no evidence of acute infarction. Remaining bones and soft tissues are within normal.  IMPRESSION: No acute intracranial findings.   Electronically Signed   By: Marin Olp M.D.   On: 08/25/2014 18:08   Dg Chest Port 1 View  09/18/2014   CLINICAL DATA:  Seizures  EXAM: PORTABLE CHEST - 1 VIEW  COMPARISON:  06/19/2012  FINDINGS: The heart is mildly enlarged but stable. Moderate vascular congestion without overt pulmonary edema. No pleural effusions or definite infiltrates. The bony thorax is grossly intact.  IMPRESSION: Stable mild cardiac enlargement and moderate vascular congestion. No definite edema or effusions.   Electronically Signed   By: Marijo Sanes M.D.   On: 09/18/2014 10:24     PERTINENT LAB RESULTS: CBC:  Recent Labs  09/18/14 1823 09/19/14 0650  WBC 3.4* 3.1*  HGB 11.7* 10.9*  HCT 36.1 34.0*  PLT 178 168   CMET CMP     Component Value Date/Time   NA 133* 09/19/2014 0650   NA 136 09/28/2013 0818   K 4.8 09/19/2014 0650   CL 103 09/19/2014 0650   CO2 25 09/19/2014 0650   GLUCOSE 470* 09/19/2014 0650   GLUCOSE 456* 09/28/2013 0818   BUN <5* 09/19/2014 0650   BUN 7 09/28/2013 0818   CREATININE 0.50 09/19/2014 0650   CALCIUM 7.9* 09/19/2014 0650   PROT 7.1 09/18/2014 0930   PROT 7.4 09/28/2013 0818   ALBUMIN 3.5  09/18/2014 0930   AST 30 09/18/2014 0930   ALT 14 09/18/2014 0930   ALKPHOS 46 09/18/2014 0930   BILITOT 1.2 09/18/2014 0930   GFRNONAA >60 09/19/2014 0650   GFRAA >60 09/19/2014 0650    GFR Estimated Creatinine Clearance: 101.8 mL/min (by C-G formula based on Cr of 0.5).  Recent Labs  09/18/14 0930  LIPASE 21*   No results for input(s): CKTOTAL, CKMB, CKMBINDEX, TROPONINI in the last 72 hours. Invalid input(s): POCBNP No results for input(s): DDIMER in the last 72 hours.  Recent Labs  09/18/14 1611  HGBA1C 8.7*   No results for input(s): CHOL, HDL, LDLCALC, TRIG, CHOLHDL, LDLDIRECT in the last 72  hours.  Recent Labs  09/18/14 1545  TSH 0.430   No results for input(s): VITAMINB12, FOLATE, FERRITIN, TIBC, IRON, RETICCTPCT in the last 72 hours. Coags: No results for input(s): INR in the last 72 hours.  Invalid input(s): PT Microbiology: Recent Results (from the past 240 hour(s))  Urine culture     Status: None   Collection Time: 09/18/14  2:27 PM  Result Value Ref Range Status   Specimen Description URINE, CLEAN CATCH  Final   Special Requests NONE  Final   Culture MULTIPLE SPECIES PRESENT, SUGGEST RECOLLECTION  Final   Report Status 09/19/2014 FINAL  Final     BRIEF HOSPITAL COURSE:  Seizure disorder: With possible breakthrough seizure intermixed with pseudoseizure. Suspect etiology likely not taking medications due to vomiting. Recommendations from neurology are to place patient back on her home dosing of Keppra-per patient and family (mother and sister at bedside) patient on 1500 mg in a.m. and 2000 mg of Keppra in p.m. No further recommendations from neurology.  Active Problems: Hypoglycemia: Likely secondary to poor oral intake and vomiting. Resumed Levemir at 15 units am,with SSI on 8/25, CBGs appear stable-Will increase Levemir to 20 units on discharge. This M.D. spoke with Dr. Dwyane Dee patient's primary endocrinologist over the phone on 8/26, he recommended  that we continue metformin, Actos and Victoza. He will follow the patient in 2 weeks and make further changes.   ? UTI: Doubt-UA negative. Denies any dysuria or polyuria to me this morning. Discontinued ciprofloxacin and monitored-no fever or no recurrence of any symptoms.  DM2 (diabetes mellitus, type 2): See above  Essential hypertension: Stable, continue ramipril  Dyslipidemia: Continue with pravastatin.  GERD: Continue PPI   TODAY-DAY OF DISCHARGE:  Subjective:   Rosalin Buster today has no headache,no chest abdominal pain,no new weakness tingling or numbness, feels much better wants to go home today.   Objective:   Blood pressure 135/73, pulse 76, temperature 98.2 F (36.8 C), temperature source Oral, resp. rate 18, SpO2 98 %.  Intake/Output Summary (Last 24 hours) at 09/20/14 1042 Last data filed at 09/20/14 0859  Gross per 24 hour  Intake      0 ml  Output    200 ml  Net   -200 ml   There were no vitals filed for this visit.  Exam Awake Alert, Oriented *3, No new F.N deficits, Normal affect [place].AT,PERRAL Supple Neck,No JVD, No cervical lymphadenopathy appriciated.  Symmetrical Chest wall movement, Good air movement bilaterally, CTAB RRR,No Gallops,Rubs or new Murmurs, No Parasternal Heave +ve B.Sounds, Abd Soft, Non tender, No organomegaly appriciated, No rebound -guarding or rigidity. No Cyanosis, Clubbing or edema, No new Rash or bruise  DISCHARGE CONDITION: Stable  DISPOSITION: Home  DISCHARGE INSTRUCTIONS:    Activity:  As tolerated  Diet recommendation: Diabetic Diet Heart Healthy diet   Discharge Instructions    Call MD for:    Complete by:  As directed   Recurrent seizures     Diet - low sodium heart healthy    Complete by:  As directed      Diet Carb Modified    Complete by:  As directed      Increase activity slowly    Complete by:  As directed            Follow-up Information    Follow up with Colin Benton R., DO. Schedule an  appointment as soon as possible for a visit in 1 week.   Specialty:  Family Medicine  Contact information:   [place] [address] [phone]       Follow up with [facility], MD. Schedule an appointment as soon as possible for a visit in 1 week.   Specialty:  Endocrinology   Contact information:   [address] [place] [address] [phone]      Total Time spent on discharge equals 45 minutes.  SignedOren Binet 09/20/2014 10:42 AM

## 2014-09-20 NOTE — Progress Notes (Signed)
Patient has called mother to pick her up, patient states mother will be here in the afternoon to get her.

## 2014-09-25 DIAGNOSIS — G40309 Generalized idiopathic epilepsy and epileptic syndromes, not intractable, without status epilepticus: Secondary | ICD-10-CM | POA: Diagnosis not present

## 2014-09-25 DIAGNOSIS — I10 Essential (primary) hypertension: Secondary | ICD-10-CM | POA: Diagnosis not present

## 2014-09-25 DIAGNOSIS — E1165 Type 2 diabetes mellitus with hyperglycemia: Secondary | ICD-10-CM | POA: Diagnosis not present

## 2014-09-25 DIAGNOSIS — J45998 Other asthma: Secondary | ICD-10-CM | POA: Diagnosis not present

## 2014-09-26 ENCOUNTER — Encounter: Payer: Self-pay | Admitting: *Deleted

## 2014-09-26 ENCOUNTER — Ambulatory Visit: Payer: Medicare Other | Admitting: Family Medicine

## 2014-10-02 ENCOUNTER — Encounter: Payer: Self-pay | Admitting: Adult Health

## 2014-10-02 ENCOUNTER — Other Ambulatory Visit: Payer: Self-pay | Admitting: Endocrinology

## 2014-10-02 ENCOUNTER — Ambulatory Visit (INDEPENDENT_AMBULATORY_CARE_PROVIDER_SITE_OTHER): Payer: Self-pay | Admitting: Adult Health

## 2014-10-02 VITALS — BP 147/89 | HR 92 | Ht 63.0 in | Wt 221.0 lb

## 2014-10-02 DIAGNOSIS — G40209 Localization-related (focal) (partial) symptomatic epilepsy and epileptic syndromes with complex partial seizures, not intractable, without status epilepticus: Secondary | ICD-10-CM

## 2014-10-02 NOTE — Progress Notes (Signed)
    PATIENT: Tammy Arellano DOB: 03-Sep-1965  REASON FOR VISIT: follow up HISTORY FROM: patient  HISTORY OF PRESENT ILLNESS: Tammy Arellano is a 49 year old female with a history of seizures. She was scheduled today for follow-up appointment. However the patient did receive a second opinion from Dr. Karel Jarvis. The patient and her mother are confused. They felt they were coming to our office for a sleep deprivation EEG. I explained that this was ordered by Dr. Karel Jarvis. The patient wants Dr. Karel Jarvis to manage her seizure disorder. I will not proceed any further with this visit.     PHYSICAL EXAM  Filed Vitals:   10/02/14 0832  BP: 147/89  Pulse: 92  Height:  (1.6 m)  Weight: 221 lb (100.245 kg)   Body mass index is 39.16 kg/(m^2).   DIAGNOSTIC DATA (LABS, IMAGING, TESTING) - I reviewed patient records, labs, notes, testing and imaging myself where available.    ASSESSMENT AND PLAN 49 y.o. year old female  has a past medical history of Mental retardation; Pseudoseizures; Obesity; Hypertension; Dyslipidemia; Asthma; Diabetes mellitus; and Epileptic seizures. here with:  Patient will follow with Dr. Karel Jarvis for her seizure disorder.    Butch Penny, MSN, NP-C 10/02/2014, 9:05 AM [facility] [address] [place], [place] [address] [phone]

## 2014-10-02 NOTE — Progress Notes (Signed)
I have read the note, and I agree with the clinical assessment and plan.  Jazzma Neidhardt KEITH   

## 2014-10-08 NOTE — Telephone Encounter (Signed)
There is a note from [facility] that patient went there thinking she had an appointment for sleep-deprived EEG. Can you pls reschedule this for her? Thanks

## 2014-10-09 ENCOUNTER — Encounter: Payer: Self-pay | Admitting: Endocrinology

## 2014-10-09 ENCOUNTER — Ambulatory Visit (INDEPENDENT_AMBULATORY_CARE_PROVIDER_SITE_OTHER): Payer: Medicare Other | Admitting: Endocrinology

## 2014-10-09 ENCOUNTER — Telehealth: Payer: Self-pay | Admitting: Endocrinology

## 2014-10-09 ENCOUNTER — Other Ambulatory Visit: Payer: Self-pay | Admitting: *Deleted

## 2014-10-09 VITALS — BP 132/86 | HR 88 | Temp 98.1°F | Resp 16 | Ht 63.0 in | Wt 226.0 lb

## 2014-10-09 DIAGNOSIS — E1165 Type 2 diabetes mellitus with hyperglycemia: Secondary | ICD-10-CM | POA: Diagnosis not present

## 2014-10-09 DIAGNOSIS — IMO0002 Reserved for concepts with insufficient information to code with codable children: Secondary | ICD-10-CM

## 2014-10-09 MED ORDER — GLUCOSE BLOOD VI STRP: ORAL_STRIP | Status: DC

## 2014-10-09 NOTE — Telephone Encounter (Signed)
Pt needs refill on needles for accu-chek aviva

## 2014-10-09 NOTE — Telephone Encounter (Signed)
rx sent

## 2014-10-09 NOTE — Progress Notes (Signed)
Patient ID: Tammy Arellano, female   DOB: Jan 30, 1965, 49 y.o.   MRN: [id number]           Reason for Appointment: Follow-up for Type 2 Diabetes  Referring physician: Tommy Medal  History of Present Illness:          Date of diagnosis of type 2 diabetes mellitus :        Background history:  Most likely she had been on oral hypoglycemic drugs for several years and this was switched to insulin about 10 years ago Not clear if she ever took any GLP-1 drugs, she does not remember taking Victoza Records available indicate that her A1c in 2015 was 12.1 in May and 9.5, 3 months later Her records indicate that she had been on various insulin regimens in the past including Lantus and 50/50 insulin as also Levemir Novolog   Recent history: Increase Novolog at breakfast and supper Increase Levemir to 34 and take it at suppertime  INSULIN regimen is described as: Levemir Insulin 30 at 8 pm   Novolog 9 ac tid      On her initial consultation was felt that she was having poor control of her diabetes related to not following her diet, some possible confusion about the types of insulin she was taking and also checking blood sugars very infrequently Also because of her difficulty with losing weight and going off her diet frequently she was started on Victoza She has taken 1.2 mg Victoza daily   Current blood sugar patterns and problems identified:   Her blood sugars are looking overall better on this visit even though about same at 8.7 last month  She was advised to see the dietitian in follow-up to improve her compliance but she did not do so  She thinks that she is watching her snacks a little better as well as not having as many sweetened drinks  More recently her fasting readings have been somewhat variable but high the last 3 days.  She has sporadic low blood sugars after meals including after breakfast today but usually not after dinner  Difficult to know which readings are postprandial  in the evening as she is not marking them after meals Her blood sugars are the most variable in the mornings and some around supper time  Oral hypoglycemic drugs the patient is taking are:   Actoplusmet once a day.       Side effects from medications have been: None  Compliance with the medical regimen: fair Hypoglycemia:   as above  Glucose monitoring:  done 1-2 times a day         Glucometer: Accucheck     Blood Glucose readings by   Mean values apply above for all meters except median for One Touch  PRE-MEAL Fasting Lunch Dinner Bedtime Overall  Glucose range:  85-216   72-220+ 104-164   Mean/median:  124      121      Self-care:       Typical meal intake: Breakfast is cereal, egg, usually 3 meals a day               Dietician visit, most recent: 9/14. CDE 5/16               Exercise: exercise bike at home, generally once a week  Weight history:  Wt Readings from Last 3 Encounters:  10/09/14 226 lb (102.513 kg)  10/02/14 221 lb (100.245 kg)  09/16/14 231 lb (104.781 kg)  Glycemic control:   Lab Results  Component Value Date   HGBA1C 8.7* 09/18/2014   HGBA1C 8.6* 09/16/2014   HGBA1C 8.6* 09/16/2014   Lab Results  Component Value Date   MICROALBUR 1.5 08/29/2014   LDLCALC 64 08/29/2014   CREATININE 0.50 09/19/2014         Medication List       This list is accurate as of: 10/09/14 11:59 PM.  Always use your most recent med list.               albuterol 108 (90 BASE) MCG/ACT inhaler  Commonly known as:  PROVENTIL HFA;VENTOLIN HFA  Inhale 2 puffs into the lungs every 6 (six) hours as needed for wheezing.     amLODipine 5 MG tablet  Commonly known as:  NORVASC     beclomethasone 80 MCG/ACT inhaler  Commonly known as:  QVAR  Inhale 2 puffs into the lungs 2 (two) times daily.     CALTRATE 600+D 600-400 MG-UNIT per tablet  Generic drug:  Calcium Carbonate-Vitamin D  Take 1 tablet by mouth daily.     ferrous fumarate 325 (106 FE) MG Tabs tablet    Commonly known as:  HEMOCYTE - 106 mg FE  Take 1 tablet by mouth.     fexofenadine 180 MG tablet  Commonly known as:  ALLEGRA  Take 180 mg by mouth daily.     glucose blood test strip  Commonly known as:  ACCU-CHEK AVIVA  Use as instructed to check blood sugar 2 times per day dx code E11.65     insulin aspart 100 UNIT/ML FlexPen  Commonly known as:  NOVOLOG FLEXPEN  Inject 0-9 Units into the skin 3 (three) times daily with meals. Sliding scale     insulin detemir 100 unit/ml Soln  Commonly known as:  LEVEMIR  Inject 0.2 mLs (20 Units total) into the skin every morning.     LEVEMIR FLEXTOUCH 100 UNIT/ML Pen  Generic drug:  Insulin Detemir     LAMICTAL 200 MG tablet  Generic drug:  lamoTRIgine  TAKE 1 TABLET BY MOUTH TWICE DAILY     levETIRAcetam 1000 MG tablet  Commonly known as:  KEPPRA  Take one and one half tablets each morning and take two tablets each evening     Liraglutide 18 MG/3ML Sopn  Commonly known as:  VICTOZA  0.6  Mg into the skin per day     loratadine 10 MG tablet  Commonly known as:  CLARITIN  Take 10 mg by mouth daily.     montelukast 10 MG tablet  Commonly known as:  SINGULAIR  Take 10 mg by mouth at bedtime.     multivitamin with minerals Tabs tablet  Take 1 tablet by mouth daily.     naproxen 500 MG tablet  Commonly known as:  NAPROSYN  Take 1 tablet (500 mg total) by mouth 2 (two) times daily as needed.     omeprazole 20 MG capsule  Commonly known as:  PRILOSEC  Take 20 mg by mouth daily.     ondansetron 4 MG tablet  Commonly known as:  ZOFRAN  Take 1 tablet (4 mg total) by mouth every 6 (six) hours.     pioglitazone-metformin 15-850 MG per tablet  Commonly known as:  ACTOPLUS MET  Take 1 tablet by mouth daily with breakfast.     pravastatin 40 MG tablet  Commonly known as:  PRAVACHOL  Take 40 mg by mouth daily.  ramipril 5 MG capsule  Commonly known as:  ALTACE  Take 1 capsule (5 mg total) by mouth daily.     vitamin C  500 MG tablet  Commonly known as:  ASCORBIC ACID  Take 500 mg by mouth daily.     vitamin E 400 UNIT capsule  Take 400 Units by mouth daily.        Allergies:  Allergies  Allergen Reactions  . Dilantin [Phenytoin Sodium Extended] Anaphylaxis    Induces seizures   . Furosemide Anaphylaxis  . Shellfish Allergy Anaphylaxis  . Depakote [Divalproex Sodium] Other (See Comments)    Leukopenia and weight gain  . Lip Balm [Chapstick] Other (See Comments)    Lips swell only chapstick brand  . Penicillins Nausea And Vomiting  . Latex Rash    Past Medical History  Diagnosis Date  . Mental retardation   . Pseudoseizures   . Obesity   . Hypertension   . Dyslipidemia   . Asthma   . Diabetes mellitus   . Epileptic seizures     Past Surgical History  Procedure Laterality Date  . Reduction mammaplasty Bilateral ~ 1988  . Breast cyst excision Left ~ 1988    Family History  Problem Relation Age of Onset  . Osteoarthritis Mother   . Diabetes Mother   . Hypertension Mother   . Clotting disorder Mother     blood disorder  . Cancer Father     Lung  . Seizures Father   . Cancer Maternal Grandmother   . Heart attack Paternal Grandmother     Social History:  reports that she has never smoked. She has never used smokeless tobacco. She reports that she does not drink alcohol or use illicit drugs.    Review of Systems   She is followed by the neurologist for seizure disorder    Lipid history: Last LDL was 64  She is taking pravastatin although this may have been started in 4/16     Lab Results  Component Value Date   CHOL 129 08/29/2014   HDL 66 09/16/2014   LDLCALC 64 08/29/2014   TRIG 35.0 08/29/2014   CHOLHDL 2 08/29/2014            Most recent eye exam was in 2015   Hypertension: This is being treated with amlodipine and ramipril.  She has not established with her PCP  Last diabetic foot exam was in 05/2014   Physical Examination:  BP 132/86 mmHg  Pulse 88   Temp(Src) 98.1 F (36.7 C)  Resp 16  Ht $R'5\' 3"'Tx$  (1.6 m)  Wt 226 lb (102.513 kg)  BMI 40.04 kg/m2  SpO2 98%      ASSESSMENT:  Diabetes type 2, uncontrolled with BMI around 40  See history of present illness for detailed discussion of his current management, blood sugar patterns and problems identified Her average blood sugar recently is only about 121 which is improved She is probably doing better on her diet overall and appears to be requiring somewhat less insulin to cover her meals especially breakfast and lunch Some of her readings are higher at suppertime and also in the morning based on snacks   PLAN:   Increase Levemir to improve fasting readings  Consistent amount of breakfast especially with adding protein  Will need to adjust her meal size based on type of meals and will need more insulin for higher fat meals that she has occasionally  Encouraged her to start increasing her activity level  Patient Instructions  Levemir 32 units daily  Add egg, cheese or low fat meat yogurt at brkfst  Reduce Novolog to 7 in am; but for large hi fat meal take 12 units  Check blood sugars on waking up .Marland Kitchen4-5  .Marland Kitchen times a week Also check blood sugars about 2 hours after a meal and do this after different meals by rotation Recommended blood sugar levels on waking up is 90-130 and about 2 hours after meal is 140-180 Please bring blood sugar monitor to each visit.           [facility] 10/10/2014, 8:43 PM   Note: This office note was prepared with Dragon voice recognition system technology. Any transcriptional errors that result from this process are unintentional.

## 2014-10-09 NOTE — Patient Instructions (Addendum)
Levemir 32 units daily  Add egg, cheese or low fat meat yogurt at brkfst  Reduce Novolog to 7 in am; but for large hi fat meal take 12 units  Check blood sugars on waking up .Marland Kitchen4-5  .Marland Kitchen times a week Also check blood sugars about 2 hours after a meal and do this after different meals by rotation Recommended blood sugar levels on waking up is 90-130 and about 2 hours after meal is 140-180 Please bring blood sugar monitor to each visit.

## 2014-10-14 DIAGNOSIS — E119 Type 2 diabetes mellitus without complications: Secondary | ICD-10-CM | POA: Diagnosis not present

## 2014-10-14 DIAGNOSIS — Z794 Long term (current) use of insulin: Secondary | ICD-10-CM | POA: Diagnosis not present

## 2014-10-23 ENCOUNTER — Other Ambulatory Visit: Payer: Medicare Other

## 2014-10-28 ENCOUNTER — Ambulatory Visit (INDEPENDENT_AMBULATORY_CARE_PROVIDER_SITE_OTHER): Payer: Medicare Other | Admitting: Endocrinology

## 2014-10-28 ENCOUNTER — Other Ambulatory Visit: Payer: Self-pay | Admitting: *Deleted

## 2014-10-28 ENCOUNTER — Encounter: Payer: Self-pay | Admitting: Endocrinology

## 2014-10-28 VITALS — BP 130/76 | HR 94 | Temp 98.2°F | Resp 16 | Ht 63.0 in | Wt 230.0 lb

## 2014-10-28 DIAGNOSIS — Z23 Encounter for immunization: Secondary | ICD-10-CM

## 2014-10-28 DIAGNOSIS — E1165 Type 2 diabetes mellitus with hyperglycemia: Secondary | ICD-10-CM

## 2014-10-28 DIAGNOSIS — Z794 Long term (current) use of insulin: Secondary | ICD-10-CM

## 2014-10-28 DIAGNOSIS — IMO0001 Reserved for inherently not codable concepts without codable children: Secondary | ICD-10-CM

## 2014-10-28 MED ORDER — IRBESARTAN 150 MG PO TABS: 150.0000 mg | ORAL_TABLET | Freq: Every day | ORAL | Status: DC

## 2014-10-28 MED ORDER — ACCU-CHEK FASTCLIX LANCETS MISC: Status: DC

## 2014-10-28 NOTE — Progress Notes (Signed)
OhPatient ID: Tammy Arellano, female   DOB: 22-May-1965, 49 y.o.   MRN: [id number]           Reason for Appointment: Follow-up for Type 2 Diabetes  Referring physician: Maudie Mercury  History of Present Illness:          Date of diagnosis of type 2 diabetes mellitus:  ?  2000       Background history:  Most likely she had been on oral hypoglycemic drugs for several years and this was switched to insulin about 10 years ago Not clear if she ever took any GLP-1 drugs, she does not remember taking Victoza Records available indicate that her A1c in 2015 was 12.1 in May and 9.5, 3 months later Her records indicate that she had been on various insulin regimens in the past including Lantus and 50/50 insulin as also Levemir Novolog  Recent history:   INSULIN regimen is described as: Levemir Insulin 20 units at 8 pm   Novolog 10 ac tid      On her initial consultation was felt that she was having poor control of her diabetes related to not following her diet, some possible confusion about the types of insulin she was taking and also checking blood sugars very infrequently Also because of her difficulty with losing weight and going off her diet frequently she was started on Victoza She has taken 1.2 mg Victoza daily since about 6/16  Current blood sugar patterns and problems identified:   Her blood sugars are somewhat variable with periods of hypoglycemia and hyperglycemia  She was told to take 32 units of Levemir insulin but she is taking only 20 units and not clear why  She did have hypoglycemia only once on waking up about 3 weeks ago but fasting readings are mostly HIGH recently  Blood sugars are tending to be normal to low midmorning and before lunch because she is eating only cereal for breakfast despite previous instructions on how to add protein in the morning  She is not able to adjust her dose based on what she is eating  Blood sugars are overall quite variable  She is still drinking  some regular soft drinks although she thinks it's only 1 or 2 a week  She is eating large amounts of fruits of all kinds throughout the day, probably 5-6 servings a day  She is starting to gain weight  Oral hypoglycemic drugs the patient is taking are:   Actoplusmet once a day.       Side effects from medications have been: None  Compliance with the medical regimen: fair Hypoglycemia:   as above sporadically on waking up or midmorning/lunchtime  Glucose monitoring:  done 1-2 times a day         Glucometer: Accucheck     Blood Glucose readings by download over the last 30 days  Mean values apply above for all meters except median for One Touch  PRE-MEAL Fasting Lunch Dinner  PCS  Overall  Glucose range:  45-234   58-214   166   91-287    Mean/median:  151   114    180   145     Self-care:       Typical meal intake: Breakfast is cereal or oatmeal usually 3 meals a day               Dietician visit, most recent: 9/14. CDE 5/16  Exercise: exercise bike at home, infrequently   Weight history:  Wt Readings from Last 3 Encounters:  10/28/14 230 lb (104.327 kg)  10/09/14 226 lb (102.513 kg)  10/02/14 221 lb (100.245 kg)    Glycemic control:   Lab Results  Component Value Date   HGBA1C 8.7* 09/18/2014   HGBA1C 8.6* 09/16/2014   HGBA1C 8.6* 09/16/2014   Lab Results  Component Value Date   MICROALBUR 1.5 08/29/2014   LDLCALC 64 08/29/2014   CREATININE 0.50 09/19/2014         Medication List       This list is accurate as of: 10/28/14  9:45 AM.  Always use your most recent med list.               ACCU-CHEK FASTCLIX LANCETS Misc  Use 5 per day to check blood sugar dx code E11.65     albuterol 108 (90 BASE) MCG/ACT inhaler  Commonly known as:  PROVENTIL HFA;VENTOLIN HFA  Inhale 2 puffs into the lungs every 6 (six) hours as needed for wheezing.     amLODipine 5 MG tablet  Commonly known as:  NORVASC     beclomethasone 80 MCG/ACT inhaler    Commonly known as:  QVAR  Inhale 2 puffs into the lungs 2 (two) times daily.     CALTRATE 600+D 600-400 MG-UNIT tablet  Generic drug:  Calcium Carbonate-Vitamin D  Take 1 tablet by mouth daily.     ferrous fumarate 325 (106 FE) MG Tabs tablet  Commonly known as:  HEMOCYTE - 106 mg FE  Take 1 tablet by mouth.     fexofenadine 180 MG tablet  Commonly known as:  ALLEGRA  Take 180 mg by mouth daily.     glucose blood test strip  Commonly known as:  ACCU-CHEK AVIVA  Use as instructed to check blood sugar 2 times per day dx code E11.65     insulin aspart 100 UNIT/ML FlexPen  Commonly known as:  NOVOLOG FLEXPEN  Inject 0-9 Units into the skin 3 (three) times daily with meals. Sliding scale     irbesartan 150 MG tablet  Commonly known as:  AVAPRO  Take 1 tablet (150 mg total) by mouth daily.     LAMICTAL 200 MG tablet  Generic drug:  lamoTRIgine  TAKE 1 TABLET BY MOUTH TWICE DAILY     LEVEMIR FLEXTOUCH 100 UNIT/ML Pen  Generic drug:  Insulin Detemir  Inject 20 Units into the skin every morning.     levETIRAcetam 1000 MG tablet  Commonly known as:  KEPPRA  Take one and one half tablets each morning and take two tablets each evening     Liraglutide 18 MG/3ML Sopn  Commonly known as:  VICTOZA  0.6  Mg into the skin per day     loratadine 10 MG tablet  Commonly known as:  CLARITIN  Take 10 mg by mouth daily.     montelukast 10 MG tablet  Commonly known as:  SINGULAIR  Take 10 mg by mouth at bedtime.     multivitamin with minerals Tabs tablet  Take 1 tablet by mouth daily.     naproxen 500 MG tablet  Commonly known as:  NAPROSYN  Take 1 tablet (500 mg total) by mouth 2 (two) times daily as needed.     omeprazole 20 MG capsule  Commonly known as:  PRILOSEC  Take 20 mg by mouth daily.     ondansetron 4 MG tablet  Commonly known as:  ZOFRAN  Take 1 tablet (4 mg total) by mouth every 6 (six) hours.     pioglitazone-metformin 15-850 MG tablet  Commonly known as:   ACTOPLUS MET  Take 1 tablet by mouth daily with breakfast.     pravastatin 40 MG tablet  Commonly known as:  PRAVACHOL  Take 40 mg by mouth daily.     vitamin C 500 MG tablet  Commonly known as:  ASCORBIC ACID  Take 500 mg by mouth daily.     vitamin E 400 UNIT capsule  Take 400 Units by mouth daily.        Allergies:  Allergies  Allergen Reactions  . Dilantin [Phenytoin Sodium Extended] Anaphylaxis    Induces seizures   . Furosemide Anaphylaxis  . Shellfish Allergy Anaphylaxis  . Depakote [Divalproex Sodium] Other (See Comments)    Leukopenia and weight gain  . Lip Balm [Chapstick] Other (See Comments)    Lips swell only chapstick brand  . Penicillins Nausea And Vomiting  . Latex Rash    Past Medical History  Diagnosis Date  . Mental retardation   . Pseudoseizures   . Obesity   . Hypertension   . Dyslipidemia   . Asthma   . Diabetes mellitus   . Epileptic seizures [facility])     Past Surgical History  Procedure Laterality Date  . Reduction mammaplasty Bilateral ~ 1988  . Breast cyst excision Left ~ 1988    Family History  Problem Relation Age of Onset  . Osteoarthritis Mother   . Diabetes Mother   . Hypertension Mother   . Clotting disorder Mother     blood disorder  . Cancer Father     Lung  . Seizures Father   . Cancer Maternal Grandmother   . Heart attack Paternal Grandmother     Social History:  reports that she has never smoked. She has never used smokeless tobacco. She reports that she does not drink alcohol or use illicit drugs.    Review of Systems   She is followed by the neurologist for seizure disorder  Lipid history: Last LDL was 64  She is taking pravastatin     Lab Results  Component Value Date   CHOL 129 08/29/2014   HDL 66 09/16/2014   LDLCALC 64 08/29/2014   TRIG 35.0 08/29/2014   CHOLHDL 2 08/29/2014            Most recent eye exam was in 2015   Hypertension: This is being treated with amlodipine and ramipril.   Has  cough especially at night in her mother is asking about what medications are causing the cough   Last diabetic foot exam was in 05/2014   Physical Examination:  BP 130/76 mmHg  Pulse 94  Temp(Src) 98.2 F (36.8 C)  Resp 16  Ht $R'5\' 3"'oj$  (1.6 m)  Wt 230 lb (104.327 kg)  BMI 40.75 kg/m2  SpO2 96%      ASSESSMENT:  Diabetes type 2, uncontrolled with BMI around 40  See history of present illness for detailed discussion of his current management, blood sugar patterns and problems identified Although her blood sugars are averaging only about 145 for the last 3 months they are very inconsistent throughout the day She is taking somewhat arbitrary doses of insulin and not reading instructions for her insulin doses She has cut back on her Levemir by 10 units on her own even though she was supposed to increase it by 2 units She does tend to have  hyperglycemia late morning because of not eating protein in the morning at breakfast and continuing to take the same amount of insulin She does not follow instructions easily and discussed that she does need different amounts of Novolog at different meals Some of her high sugars are related to eating excess amount of carbohydrates including fruits and some soft drinks Gaining weight from lack of exercise despite taking Victoza   COUGH: Likely to be from ramipril.  She says she is not able to get this addressed through her PCP    PLAN:   Increase Levemir to improve fasting readings, will go up to 24 for now   Consistent intake of protein at breakfast, given examples  Reduce morning Novolog to 7  Reduce portions of fruits  Avoid soft drinks  Increase suppertime Novolog by 2 units  Follow-up in 6 weeks   Encouraged her to start increasing using her exercise bike    change ramipril to Avapro 150 mg    influenza vaccine given  Patient Instructions  Levemir Insulin 24 levemir at supper    Novolog reduce to 7 units in am, 10 at lunch and  12 at supper  Have an egg or other protein in am  Change Ramipril to Irbesartan $RemoveBefor'150mg'SLDuoftpbvPW$  daily  No more than 3 small fruits daily     Counseling time on subjects discussed above is over 50% of today's 25 minute visit      Ieesha Abbasi 10/28/2014, 9:45 AM   Note: This office note was prepared with Estate agent. Any transcriptional errors that result from this process are unintentional.

## 2014-10-28 NOTE — Patient Instructions (Addendum)
Levemir Insulin 24 levemir at supper    Novolog reduce to 7 units in am, 10 at lunch and 12 at supper  Have an egg or other protein in am  Change Ramipril to Irbesartan  daily  No more than 3 small fruits daily

## 2014-10-30 ENCOUNTER — Other Ambulatory Visit: Payer: Self-pay | Admitting: Neurology

## 2014-10-31 ENCOUNTER — Other Ambulatory Visit: Payer: Self-pay | Admitting: Endocrinology

## 2014-11-29 ENCOUNTER — Other Ambulatory Visit: Payer: Self-pay | Admitting: Endocrinology

## 2014-12-02 NOTE — Progress Notes (Signed)
Late entry for missed G-code. Based on review of the evaluation and goals by Beaulah CorinSusan Davis, PT.   09/19/14 1200  PT G-Codes **NOT FOR INPATIENT CLASS**  Functional Assessment Tool Used clinical judgement based on review of medical record  Functional Limitation Mobility: Walking and moving around  Mobility: Walking and Moving Around Current Status ([id number](G8978) CK  Mobility: Walking and Moving Around Goal Status [phone](G8979) CI  Lavona MoundMark Abhishek Levesque, PT  [phone] 12/02/2014

## 2014-12-06 ENCOUNTER — Ambulatory Visit: Payer: Self-pay | Admitting: Neurology

## 2014-12-09 ENCOUNTER — Other Ambulatory Visit (INDEPENDENT_AMBULATORY_CARE_PROVIDER_SITE_OTHER): Payer: Medicare Other

## 2014-12-09 DIAGNOSIS — IMO0002 Reserved for concepts with insufficient information to code with codable children: Secondary | ICD-10-CM

## 2014-12-09 DIAGNOSIS — E1165 Type 2 diabetes mellitus with hyperglycemia: Secondary | ICD-10-CM | POA: Diagnosis not present

## 2014-12-09 LAB — COMPREHENSIVE METABOLIC PANEL
ALBUMIN: 4 g/dL (ref 3.5–5.2)
ALK PHOS: 59 U/L (ref 39–117)
ALT: 13 U/L (ref 0–35)
AST: 14 U/L (ref 0–37)
BUN: 13 mg/dL (ref 6–23)
CALCIUM: 9.7 mg/dL (ref 8.4–10.5)
CO2: 32 mEq/L (ref 19–32)
Chloride: 98 mEq/L (ref 96–112)
Creatinine, Ser: 0.62 mg/dL (ref 0.40–1.20)
GFR: 132.62 mL/min (ref >60.00)
Glucose, Bld: 319 mg/dL — ABNORMAL HIGH (ref 70–99)
POTASSIUM: 4.1 meq/L (ref 3.5–5.1)
Sodium: 137 mEq/L (ref 135–145)
TOTAL PROTEIN: 7.7 g/dL (ref 6.0–8.3)
Total Bilirubin: 0.5 mg/dL (ref 0.2–1.2)

## 2014-12-09 LAB — HEMOGLOBIN A1C: HEMOGLOBIN A1C: 10.3 % — AB (ref 4.6–6.5)

## 2014-12-12 ENCOUNTER — Ambulatory Visit: Payer: Medicare Other | Admitting: Endocrinology

## 2014-12-13 ENCOUNTER — Ambulatory Visit: Payer: Medicare Other | Admitting: Neurology

## 2014-12-23 ENCOUNTER — Ambulatory Visit: Payer: Medicare Other | Admitting: Endocrinology

## 2014-12-23 DIAGNOSIS — Z0289 Encounter for other administrative examinations: Secondary | ICD-10-CM

## 2015-01-22 DIAGNOSIS — N390 Urinary tract infection, site not specified: Secondary | ICD-10-CM | POA: Diagnosis not present

## 2015-01-22 DIAGNOSIS — E1165 Type 2 diabetes mellitus with hyperglycemia: Secondary | ICD-10-CM | POA: Diagnosis not present

## 2015-01-22 DIAGNOSIS — I10 Essential (primary) hypertension: Secondary | ICD-10-CM | POA: Diagnosis not present

## 2015-01-29 DIAGNOSIS — R05 Cough: Secondary | ICD-10-CM | POA: Diagnosis not present

## 2015-01-29 DIAGNOSIS — J45901 Unspecified asthma with (acute) exacerbation: Secondary | ICD-10-CM | POA: Diagnosis not present

## 2015-01-29 DIAGNOSIS — E1165 Type 2 diabetes mellitus with hyperglycemia: Secondary | ICD-10-CM | POA: Diagnosis not present

## 2015-01-31 ENCOUNTER — Ambulatory Visit: Payer: Medicare Other | Admitting: Neurology

## 2015-02-04 ENCOUNTER — Ambulatory Visit (INDEPENDENT_AMBULATORY_CARE_PROVIDER_SITE_OTHER): Payer: Medicare Other | Admitting: Neurology

## 2015-02-04 ENCOUNTER — Encounter: Payer: Self-pay | Admitting: Neurology

## 2015-02-04 ENCOUNTER — Ambulatory Visit: Payer: Medicare Other | Admitting: Neurology

## 2015-02-04 VITALS — BP 128/80 | HR 95 | Ht 63.0 in | Wt 220.0 lb

## 2015-02-04 DIAGNOSIS — G40209 Localization-related (focal) (partial) symptomatic epilepsy and epileptic syndromes with complex partial seizures, not intractable, without status epilepticus: Secondary | ICD-10-CM

## 2015-02-04 DIAGNOSIS — F09 Unspecified mental disorder due to known physiological condition: Secondary | ICD-10-CM

## 2015-02-04 DIAGNOSIS — E109 Type 1 diabetes mellitus without complications: Secondary | ICD-10-CM | POA: Diagnosis not present

## 2015-02-04 DIAGNOSIS — F068 Other specified mental disorders due to known physiological condition: Secondary | ICD-10-CM

## 2015-02-04 NOTE — Progress Notes (Signed)
NEUROLOGY FOLLOW UP OFFICE NOTE  Tammy Arellano [id number]  HISTORY OF PRESENT ILLNESS: I had the pleasure of seeing Tammy Arellano in follow-up in the neurology clinic on 02/04/2015.  The patient was initially seen 5 months ago for recurrent seizures and is again accompanied by her mother who helps supplement the history today.  Records and images were personally reviewed where available. She was brought to [facility] ER last 09/18/14 for seizures and hypoglycemia. Her initial BS on EMS arrival was 60, which improved to the 70s, then went back to the 40s on ER arrival. She had multiple seizures in the ER that appeared epileptic per report, however when Neurology evaluated her, she was noted to have bilateral arm shaking and turning her head side to side, and said she was having a seizure when asked. When pain was elicited, the shaking stopped. The episode witnessed by Neurology was concerning for a non-epileptic event, however per notes the previous seizures noted by ER "looked very realistic." She was restarted on home seizure medications and denies any further seizures since August 2016.   Her mother is tired of taking care of her. She states "I just gave up, I'm tired." She found the patient's pills in the trash one time. She denies any side effects on Keppra 15104m in AM, 20084min PM and Lamictal 20084mID. She had not done the MRI and EEG as scheduled on her initial visit last August. Her mother reports she has been drowsy since being started on prednisone for cough of several months.   HPI: This is a pleasant 50 37 LH woman with a history of diabetes, hypertension, hyperlipidemia, and seizures since age 71.63er mother reports she was found at a bus stop with the first seizure. She has had seizures without prior warning, other times she would feel tired, dizzy, and confused, then people would tell her she was shaking and unresponsive. She has had nocturnal seizures as well, where her mother would hear  her making funny gurgling sounds. Her mother has witnessed that a week prior to having a convulsion, she would repeat things, have a blank stare. Last convulsion was 07/01/14, her mother heard a sound in the utility room and found her on the floor. Two weeks ago, she had a seizure at church where her sister reported she had a "goofy look," unresponsive, with no convulsive activity. She was amnestic of this. She denies any post-ictal focal weakness, usually she feels tired and drunk ("I can't walk straight"). Her longest seizure-free interval was 2 years. On review of prior records on EPIC, she was on Depakote which caused weight gain and low white count. Her mother reports she had swelling with Dilantin. She has been on Keppra for many years, and switched from Depakote to Lamictal for at least 6 years. She has some problems with medication compliance, with seizures occurring when she misses medications, however she reports having breakthrough seizures even when taking medication regularly.   She carries a diagnosis of pseudoseizures as well. On review of records, in 2009, she was reported to have a pseudoseizure during EEG setup, witnessed by EEG technician. She was described as complaining of head hurting with room spinning and dizziness, during the episode of body shaking and flapping at her side, she bit her tongue, lasting less than 5 minutes. She was immediately conversing after the event. EEG was within normal limits. There is an EEG report by Dr. LovErling Cruzom 2007 with abnormal photic response with high-amplitude sharp and  slow wave activity noticed synchronously in the temporal and occipital regions. No abnormality seen outside of photic stimulation. No MRI studies available for review. I personally reviewed head CT without contrast done 08/25/2014 which was unremarkable.  She was born with forceps delivery and had ?scalp hematoma. She had delay with motor and speech milestones and was in special education  classes, diagnosed with a learning disability. She is unemployed and does not drive. She lives with her mother. She denies any olfactory/gustatory hallucinations, deja vu, rising epigastric sensation, focal numbness/tingling/weakness, myoclonic jerks. Her mother, however, reports body jerks.   Epilepsy Risk Factors: Her maternal grandfather had seizures, her father started having seizures in his 13s. She was delivered via forceps delivery and has mild cognitive impairment and delay with milestones. Otherwise there is no history of febrile convulsions, CNS infections such as meningitis/encephalitis, significant traumatic brain injury, neurosurgical procedures  Prior AEDs: Dilantin, Depakote  PAST MEDICAL HISTORY: Past Medical History  Diagnosis Date  . Mental retardation   . Pseudoseizures   . Obesity   . Hypertension   . Dyslipidemia   . Asthma   . Diabetes mellitus   . Epileptic seizures [facility])     MEDICATIONS: Current Outpatient Prescriptions on File Prior to Visit  Medication Sig Dispense Refill  . albuterol (PROVENTIL HFA;VENTOLIN HFA) 108 (90 BASE) MCG/ACT inhaler Inhale 2 puffs into the lungs every 6 (six) hours as needed for wheezing.     Marland Kitchen amLODipine (NORVASC) 5 MG tablet     . insulin aspart (NOVOLOG FLEXPEN) 100 UNIT/ML FlexPen Inject 0-9 Units into the skin 3 (three) times daily with meals. Sliding scale 15 mL 1  . LAMICTAL 200 MG tablet TAKE 1 TABLET BY MOUTH TWICE DAILY 180 tablet 1  . LEVEMIR FLEXTOUCH 100 UNIT/ML Pen Inject 20 Units into the skin every morning.     . levETIRAcetam (KEPPRA) 1000 MG tablet Take one and one half tablets each morning and take two tablets each evening 315 tablet 1  . Liraglutide (VICTOZA) 18 MG/3ML SOPN 0.6  Mg into the skin per day (Patient taking differently: 1.2  Mg into the skin per day) 6 mL 2  . loratadine (CLARITIN) 10 MG tablet Take 10 mg by mouth daily.    . montelukast (SINGULAIR) 10 MG tablet Take 10 mg by mouth at bedtime.    .  Multiple Vitamin (MULTIVITAMIN WITH MINERALS) TABS Take 1 tablet by mouth daily.    Marland Kitchen omeprazole (PRILOSEC) 20 MG capsule Take 20 mg by mouth daily.    . pioglitazone-metformin (ACTOPLUS MET) 15-850 MG per tablet Take 1 tablet by mouth daily with breakfast.    . pravastatin (PRAVACHOL) 40 MG tablet Take 40 mg by mouth daily.     . ramipril (ALTACE) 5 MG capsule TAKE 1 CAPSULE BY MOUTH EVERY DAY. 30 capsule 3  . vitamin C (ASCORBIC ACID) 500 MG tablet Take 500 mg by mouth daily.    . vitamin E 400 UNIT capsule Take 400 Units by mouth daily.     No current facility-administered medications on file prior to visit.    ALLERGIES: Allergies  Allergen Reactions  . Dilantin [Phenytoin Sodium Extended] Anaphylaxis    Induces seizures   . Furosemide Anaphylaxis  . Shellfish Allergy Anaphylaxis  . Depakote [Divalproex Sodium] Other (See Comments)    Leukopenia and weight gain  . Lip Balm [Chapstick] Other (See Comments)    Lips swell only chapstick brand  . Penicillins Nausea And Vomiting  . Latex Rash  FAMILY HISTORY: Family History  Problem Relation Age of Onset  . Osteoarthritis Mother   . Diabetes Mother   . Hypertension Mother   . Clotting disorder Mother     blood disorder  . Cancer Father     Lung  . Seizures Father   . Cancer Maternal Grandmother   . Heart attack Paternal Grandmother     SOCIAL HISTORY: Social History   Social History  . Marital Status: Single    Spouse Name: N/A  . Number of Children: 0  . Years of Education: 12   Occupational History  . Not on file.   Social History Main Topics  . Smoking status: Never Smoker   . Smokeless tobacco: Never Used  . Alcohol Use: No  . Drug Use: No  . Sexual Activity: No   Other Topics Concern  . Not on file   Social History Narrative   Patient is single with no children and resides with her mother.   Patient is left handed          REVIEW OF SYSTEMS: Constitutional: No fevers, chills, or sweats, no  generalized fatigue, change in appetite Eyes: No visual changes, double vision, eye pain Ear, nose and throat: No hearing loss, ear pain, nasal congestion, sore throat Cardiovascular: No chest pain, palpitations Respiratory:  No shortness of breath at rest or with exertion, wheezes GastrointestinaI: No nausea, vomiting, diarrhea, abdominal pain, fecal incontinence Genitourinary:  No dysuria, urinary retention or frequency Musculoskeletal:  No neck pain, back pain Integumentary: No rash, pruritus, skin lesions Neurological: as above Psychiatric: No depression, insomnia, anxiety Endocrine: No palpitations, fatigue, diaphoresis, mood swings, change in appetite, change in weight, increased thirst Hematologic/Lymphatic:  No anemia, purpura, petechiae. Allergic/Immunologic: no itchy/runny eyes, nasal congestion, recent allergic reactions, rashes  PHYSICAL EXAM: Filed Vitals:   02/04/15 1325  BP: 128/80  Pulse: 95   General: No acute distress Head:  Normocephalic/atraumatic Neck: supple, no paraspinal tenderness, full range of motion Heart:  Regular rate and rhythm Lungs:  Clear to auscultation bilaterally Back: No paraspinal tenderness Skin/Extremities: No rash, no edema Neurological Exam: alert and oriented to person, place, and time. No aphasia or dysarthria. Fund of knowledge is appropriate.  Recent and remote memory are impaired.  Attention and concentration are normal.    Able to name objects and repeat phrases. Cranial nerves: Pupils equal, round, reactive to light.  Extraocular movements intact with no nystagmus. Visual fields full. Facial sensation intact. No facial asymmetry. Tongue, uvula, palate midline.  Motor: Bulk and tone normal, muscle strength 5/5 throughout with no pronator drift.  Sensation to light touch intact.  No extinction to double simultaneous stimulation.  Deep tendon reflexes 2+ throughout, toes downgoing.  Finger to nose testing intact.  Gait wide-based, no ataxia,  able to tandem walk adequately.  Romberg negative.  IMPRESSION: This is a pleasant 50 yo LH woman with a history of diabetes, hypertension, hyperlipidemia, mild cognitive impairment, and seizures since age 38. By semiology, family has witnessed her with blank stare, unresponsiveness for several minutes, which may or may not progress to a convulsion, suggestive of localization-related epilepsy. A prior EEG in 2007 reported high-amplitude sharp and slow wave activity noticed synchronously in the temporal and occipital regions. There is also concern for non-epileptic events. She may have co-existing epilepsy and non-epileptic events, but no seizures since August 2016. At that time, she was hypoglycemic down to the 30s and 40s. Continue Keppra 1544m in AM, 20087min PM and Lamictal 20070m  BID. She had not yet had EEG or MRI done. Would proceed with 1-hour EEG for seizure classification. We can hold off on MRI brain for now, but if seizures recur, would proceed with MRI. Her mother is having more difficulties taking care of her, home health visiting nurse referral will be sent for medication assistance. She does not drive. She will follow-up in 8 months and knows to call our office for any changes.   Thank you for allowing me to participate in her care.  Please do not hesitate to call for any questions or concerns.  The duration of this appointment visit was 25 minutes of face-to-face time with the patient.  Greater than 50% of this time was spent in counseling, explanation of diagnosis, planning of further management, and coordination of care.   Tammy Arellano, M.D.   CC: Dr. Maudie Mercury

## 2015-02-04 NOTE — Patient Instructions (Signed)
1. Schedule 1-hour EEG 2. Continue Keppra 1500mg  in AM, 2000mg  in PM and Lamictal 200mg  twice a day 3. Refer to Home Health for Visiting Nurse to help with medications 4. Follow-up in 7 months, call for any changes  Seizure Precautions 1. If medication has been prescribed for you to prevent seizures, take it exactly as directed.  Do not stop taking the medicine without talking to your doctor first, even if you have not had a seizure in a long time.   2. Avoid activities in which a seizure would cause danger to yourself or to others.  Don't operate dangerous machinery, swim alone, or climb in high or dangerous places, such as on ladders, roofs, or girders.  Do not drive unless your doctor says you may.  3. If you have any warning that you may have a seizure, lay down in a safe place where you can't hurt yourself.    4.  No driving for 6 months from last seizure, [place].   Please refer to the following link on the [employer]ile[employer]'s website for more information: http://www.epilepsyfoundation.org/answerplace/Social/driving/drivingu.cfm   5.  Maintain good sleep hygiene. Avoid alcohol.  6.  Notify your neurology if you are planning pregnancy or if you become pregnant.  7.  Contact your doctor if you have any problems that may be related to the medicine you are taking.  8.  Call 911 and bring the patient back to the ED if:        A.  The seizure lasts longer than 5 minutes.       B.  The patient doesn't awaken shortly after the seizure  C.  The patient has new problems such as difficulty seeing, speaking or moving  D.  The patient was injured during the seizure  E.  The patient has a temperature over 102 F (39C)  F.  The patient vomited and now is having trouble breathing

## 2015-02-05 DIAGNOSIS — Z1389 Encounter for screening for other disorder: Secondary | ICD-10-CM | POA: Diagnosis not present

## 2015-02-05 DIAGNOSIS — I10 Essential (primary) hypertension: Secondary | ICD-10-CM | POA: Diagnosis not present

## 2015-02-05 DIAGNOSIS — E139 Other specified diabetes mellitus without complications: Secondary | ICD-10-CM | POA: Diagnosis not present

## 2015-02-05 DIAGNOSIS — R05 Cough: Secondary | ICD-10-CM | POA: Diagnosis not present

## 2015-02-05 NOTE — Addendum Note (Signed)
Addended by: Franciso BendMCNEIL, Briunna Leicht M on: 02/05/2015 10:28 AM   Modules accepted: Orders

## 2015-02-06 ENCOUNTER — Ambulatory Visit (INDEPENDENT_AMBULATORY_CARE_PROVIDER_SITE_OTHER): Payer: Medicare Other | Admitting: Neurology

## 2015-02-06 DIAGNOSIS — G40209 Localization-related (focal) (partial) symptomatic epilepsy and epileptic syndromes with complex partial seizures, not intractable, without status epilepticus: Secondary | ICD-10-CM | POA: Diagnosis not present

## 2015-02-08 DIAGNOSIS — Z794 Long term (current) use of insulin: Secondary | ICD-10-CM | POA: Diagnosis not present

## 2015-02-08 DIAGNOSIS — E119 Type 2 diabetes mellitus without complications: Secondary | ICD-10-CM | POA: Diagnosis not present

## 2015-02-08 DIAGNOSIS — F79 Unspecified intellectual disabilities: Secondary | ICD-10-CM | POA: Diagnosis not present

## 2015-02-08 DIAGNOSIS — G40209 Localization-related (focal) (partial) symptomatic epilepsy and epileptic syndromes with complex partial seizures, not intractable, without status epilepticus: Secondary | ICD-10-CM | POA: Diagnosis not present

## 2015-02-08 DIAGNOSIS — Z7984 Long term (current) use of oral hypoglycemic drugs: Secondary | ICD-10-CM | POA: Diagnosis not present

## 2015-02-08 DIAGNOSIS — M6281 Muscle weakness (generalized): Secondary | ICD-10-CM | POA: Diagnosis not present

## 2015-02-08 DIAGNOSIS — I10 Essential (primary) hypertension: Secondary | ICD-10-CM | POA: Diagnosis not present

## 2015-02-10 ENCOUNTER — Telehealth: Payer: Self-pay | Admitting: Family Medicine

## 2015-02-10 NOTE — Telephone Encounter (Signed)
-----   Message from Van ClinesKaren M Aquino, MD sent at 02/10/2015 12:48 PM EST ----- Pls let her/mother know the EEG looks good, continue same seizure medications, no changes. Thanks

## 2015-02-10 NOTE — Telephone Encounter (Signed)
I spoke with patient's mother/Norma and notified her of results and advisement.

## 2015-02-10 NOTE — Procedures (Signed)
ELECTROENCEPHALOGRAM REPORT  Date of Study: 02/06/2015  Patient's Name: Tammy Arellano MRN: [id number] Date of Birth: 11-Jan-1966  Referring Provider: Dr. Ellouise Newer  Clinical History: This is a 50 year old woman with a history of seizures, family has witnessed her with blank stare, unresponsiveness for several minutes, which may or may not progress to a convulsion. There is also concern for non-epileptic events from previous hospital visits. EEG for classification.   Medications: Lamictal, Keppra, Albuterol, Norvasc, Novolog, Levemir, Victoza, Claritin, Singulair, Multivitamin with minerals, Prilosec, Actoplus Met, Prilosec, Altace  Pravachol, Vitamin E, Vitamin C  Technical Summary: A multichannel digital 1-hour  EEG recording measured by the international 10-20 system with electrodes applied with paste and impedances below 5000 ohms performed in our laboratory with EKG monitoring in an awake and asleep patient.  Hyperventilation and photic stimulation were performed.  The digital EEG was referentially recorded, reformatted, and digitally filtered in a variety of bipolar and referential montages for optimal display.    Description: The patient is awake and asleep during the recording.  During maximal wakefulness, there is a symmetric, medium voltage 10 Hz posterior dominant rhythm that attenuates with eye opening.  The record is symmetric.  During drowsiness and sleep, there is an increase in theta slowing of the background.  Vertex waves and symmetric sleep spindles were seen.  Hyperventilation and photic stimulation did not elicit any abnormalities.  There were no epileptiform discharges or electrographic seizures seen.    EKG lead was unremarkable.  Impression: This 1-hour awake and asleep EEG is normal.    Clinical Correlation: A normal EEG does not exclude a clinical diagnosis of epilepsy.  If further clinical questions remain, prolonged EEG may be helpful.  Clinical correlation  is advised.   Ellouise Newer, M.D.

## 2015-02-11 DIAGNOSIS — F79 Unspecified intellectual disabilities: Secondary | ICD-10-CM | POA: Diagnosis not present

## 2015-02-11 DIAGNOSIS — G40209 Localization-related (focal) (partial) symptomatic epilepsy and epileptic syndromes with complex partial seizures, not intractable, without status epilepticus: Secondary | ICD-10-CM | POA: Diagnosis not present

## 2015-02-11 DIAGNOSIS — M6281 Muscle weakness (generalized): Secondary | ICD-10-CM | POA: Diagnosis not present

## 2015-02-11 DIAGNOSIS — Z794 Long term (current) use of insulin: Secondary | ICD-10-CM | POA: Diagnosis not present

## 2015-02-11 DIAGNOSIS — I10 Essential (primary) hypertension: Secondary | ICD-10-CM | POA: Diagnosis not present

## 2015-02-11 DIAGNOSIS — E119 Type 2 diabetes mellitus without complications: Secondary | ICD-10-CM | POA: Diagnosis not present

## 2015-02-13 ENCOUNTER — Other Ambulatory Visit (HOSPITAL_COMMUNITY)
Admission: RE | Admit: 2015-02-13 | Discharge: 2015-02-13 | Disposition: A | Discharge status: Home / Self Care | Payer: Medicare Other | Source: Ambulatory Visit | Attending: Registered Nurse | Admitting: Registered Nurse

## 2015-02-13 ENCOUNTER — Other Ambulatory Visit: Payer: Self-pay | Admitting: Registered Nurse

## 2015-02-13 DIAGNOSIS — Z124 Encounter for screening for malignant neoplasm of cervix: Secondary | ICD-10-CM | POA: Insufficient documentation

## 2015-02-13 DIAGNOSIS — Z01419 Encounter for gynecological examination (general) (routine) without abnormal findings: Secondary | ICD-10-CM | POA: Diagnosis not present

## 2015-02-18 LAB — CYTOLOGY - PAP

## 2015-02-26 DIAGNOSIS — J4530 Mild persistent asthma, uncomplicated: Secondary | ICD-10-CM | POA: Diagnosis not present

## 2015-02-26 DIAGNOSIS — T783XXA Angioneurotic edema, initial encounter: Secondary | ICD-10-CM | POA: Diagnosis not present

## 2015-02-26 DIAGNOSIS — J309 Allergic rhinitis, unspecified: Secondary | ICD-10-CM | POA: Diagnosis not present

## 2015-02-26 DIAGNOSIS — Z91013 Allergy to seafood: Secondary | ICD-10-CM | POA: Diagnosis not present

## 2015-02-28 DIAGNOSIS — I10 Essential (primary) hypertension: Secondary | ICD-10-CM | POA: Diagnosis not present

## 2015-02-28 DIAGNOSIS — E119 Type 2 diabetes mellitus without complications: Secondary | ICD-10-CM | POA: Diagnosis not present

## 2015-02-28 DIAGNOSIS — M6281 Muscle weakness (generalized): Secondary | ICD-10-CM | POA: Diagnosis not present

## 2015-02-28 DIAGNOSIS — F79 Unspecified intellectual disabilities: Secondary | ICD-10-CM | POA: Diagnosis not present

## 2015-02-28 DIAGNOSIS — G40209 Localization-related (focal) (partial) symptomatic epilepsy and epileptic syndromes with complex partial seizures, not intractable, without status epilepticus: Secondary | ICD-10-CM | POA: Diagnosis not present

## 2015-02-28 DIAGNOSIS — Z794 Long term (current) use of insulin: Secondary | ICD-10-CM | POA: Diagnosis not present

## 2015-03-04 ENCOUNTER — Other Ambulatory Visit: Payer: Self-pay | Admitting: Neurology

## 2015-03-06 DIAGNOSIS — I10 Essential (primary) hypertension: Secondary | ICD-10-CM | POA: Diagnosis not present

## 2015-03-06 DIAGNOSIS — G40209 Localization-related (focal) (partial) symptomatic epilepsy and epileptic syndromes with complex partial seizures, not intractable, without status epilepticus: Secondary | ICD-10-CM | POA: Diagnosis not present

## 2015-03-06 DIAGNOSIS — Z794 Long term (current) use of insulin: Secondary | ICD-10-CM | POA: Diagnosis not present

## 2015-03-06 DIAGNOSIS — E119 Type 2 diabetes mellitus without complications: Secondary | ICD-10-CM | POA: Diagnosis not present

## 2015-03-06 DIAGNOSIS — M6281 Muscle weakness (generalized): Secondary | ICD-10-CM | POA: Diagnosis not present

## 2015-03-06 DIAGNOSIS — F79 Unspecified intellectual disabilities: Secondary | ICD-10-CM | POA: Diagnosis not present

## 2015-03-10 ENCOUNTER — Other Ambulatory Visit: Payer: Self-pay | Admitting: Neurology

## 2015-03-12 ENCOUNTER — Emergency Department (HOSPITAL_COMMUNITY)
Admission: EM | Admit: 2015-03-12 | Discharge: 2015-03-12 | Disposition: A | Discharge status: Home / Self Care | Payer: Medicare Other | Attending: Emergency Medicine | Admitting: Emergency Medicine

## 2015-03-12 ENCOUNTER — Encounter (HOSPITAL_COMMUNITY): Payer: Self-pay | Admitting: Family Medicine

## 2015-03-12 DIAGNOSIS — E785 Hyperlipidemia, unspecified: Secondary | ICD-10-CM | POA: Diagnosis not present

## 2015-03-12 DIAGNOSIS — Z7982 Long term (current) use of aspirin: Secondary | ICD-10-CM | POA: Insufficient documentation

## 2015-03-12 DIAGNOSIS — Z794 Long term (current) use of insulin: Secondary | ICD-10-CM | POA: Insufficient documentation

## 2015-03-12 DIAGNOSIS — E11649 Type 2 diabetes mellitus with hypoglycemia without coma: Secondary | ICD-10-CM | POA: Diagnosis not present

## 2015-03-12 DIAGNOSIS — E669 Obesity, unspecified: Secondary | ICD-10-CM | POA: Insufficient documentation

## 2015-03-12 DIAGNOSIS — Z880 Allergy status to penicillin: Secondary | ICD-10-CM | POA: Diagnosis not present

## 2015-03-12 DIAGNOSIS — I10 Essential (primary) hypertension: Secondary | ICD-10-CM | POA: Insufficient documentation

## 2015-03-12 DIAGNOSIS — E162 Hypoglycemia, unspecified: Secondary | ICD-10-CM

## 2015-03-12 DIAGNOSIS — Z8659 Personal history of other mental and behavioral disorders: Secondary | ICD-10-CM | POA: Insufficient documentation

## 2015-03-12 DIAGNOSIS — Z7984 Long term (current) use of oral hypoglycemic drugs: Secondary | ICD-10-CM | POA: Diagnosis not present

## 2015-03-12 DIAGNOSIS — J45909 Unspecified asthma, uncomplicated: Secondary | ICD-10-CM | POA: Diagnosis not present

## 2015-03-12 DIAGNOSIS — Z79899 Other long term (current) drug therapy: Secondary | ICD-10-CM | POA: Diagnosis not present

## 2015-03-12 DIAGNOSIS — G40909 Epilepsy, unspecified, not intractable, without status epilepticus: Secondary | ICD-10-CM | POA: Insufficient documentation

## 2015-03-12 DIAGNOSIS — Z91040 Latex allergy status: Secondary | ICD-10-CM | POA: Diagnosis not present

## 2015-03-12 LAB — COMPREHENSIVE METABOLIC PANEL
ALK PHOS: 50 U/L (ref 38–126)
ALT: 16 U/L (ref 14–54)
ANION GAP: 13 (ref 5–15)
AST: 21 U/L (ref 15–41)
Albumin: 3.7 g/dL (ref 3.5–5.0)
BILIRUBIN TOTAL: 0.5 mg/dL (ref 0.3–1.2)
BUN: 18 mg/dL (ref 6–20)
CALCIUM: 9.3 mg/dL (ref 8.9–10.3)
CO2: 21 mmol/L — ABNORMAL LOW (ref 22–32)
Chloride: 107 mmol/L (ref 101–111)
Creatinine, Ser: 0.54 mg/dL (ref 0.44–1.00)
Glucose, Bld: 90 mg/dL (ref 65–99)
Potassium: 4.2 mmol/L (ref 3.5–5.1)
SODIUM: 141 mmol/L (ref 135–145)
TOTAL PROTEIN: 7 g/dL (ref 6.5–8.1)

## 2015-03-12 LAB — CBC WITH DIFFERENTIAL/PLATELET
BASOS ABS: 0 10*3/uL (ref 0.0–0.1)
BASOS PCT: 0 %
EOS ABS: 0.1 10*3/uL (ref 0.0–0.7)
Eosinophils Relative: 1 %
HEMATOCRIT: 36.2 % (ref 36.0–46.0)
Hemoglobin: 11.8 g/dL — ABNORMAL LOW (ref 12.0–15.0)
Lymphocytes Relative: 23 %
Lymphs Abs: 1.3 10*3/uL (ref 0.7–4.0)
MCH: 27.3 pg (ref 26.0–34.0)
MCHC: 32.6 g/dL (ref 30.0–36.0)
MCV: 83.8 fL (ref 78.0–100.0)
Monocytes Absolute: 0.3 10*3/uL (ref 0.1–1.0)
Monocytes Relative: 6 %
NEUTROS ABS: 3.8 10*3/uL (ref 1.7–7.7)
Neutrophils Relative %: 70 %
PLATELETS: 182 10*3/uL (ref 150–400)
RBC: 4.32 MIL/uL (ref 3.87–5.11)
RDW: 14.3 % (ref 11.5–15.5)
WBC: 5.4 10*3/uL (ref 4.0–10.5)

## 2015-03-12 LAB — CBG MONITORING, ED
GLUCOSE-CAPILLARY: 50 mg/dL — AB (ref 65–99)
GLUCOSE-CAPILLARY: 166 mg/dL — AB (ref 65–99)
Glucose-Capillary: 51 mg/dL — ABNORMAL LOW (ref 65–99)
Glucose-Capillary: 184 mg/dL — ABNORMAL HIGH (ref 65–99)
Glucose-Capillary: 147 mg/dL — ABNORMAL HIGH (ref 65–99)

## 2015-03-12 LAB — URINALYSIS, ROUTINE W REFLEX MICROSCOPIC
BILIRUBIN URINE: NEGATIVE
GLUCOSE, UA: NEGATIVE mg/dL
HGB URINE DIPSTICK: NEGATIVE
KETONES UR: NEGATIVE mg/dL
LEUKOCYTES UA: NEGATIVE
Nitrite: NEGATIVE
PH: 5.5 (ref 5.0–8.0)
PROTEIN: NEGATIVE mg/dL
Specific Gravity, Urine: 1.024 (ref 1.005–1.030)

## 2015-03-12 MED ORDER — DEXTROSE 50 % IV SOLN
1.0000 | Freq: Once | INTRAVENOUS | Status: AC
  Administered 2015-03-12: 50 mL via INTRAVENOUS
  Filled 2015-03-12: qty 50

## 2015-03-12 NOTE — ED Notes (Signed)
CBG 51 pt given OJ.

## 2015-03-12 NOTE — Discharge Instructions (Signed)
Hypoglycemia Low blood sugar (hypoglycemia) means that the level of sugar in your blood is lower than it should be. Signs of low blood sugar include:  Getting sweaty.  Feeling hungry.  Feeling dizzy or weak.  Feeling sleepier than normal.  Feeling nervous.  Headaches.  Having a fast heartbeat. Low blood sugar can happen fast and can be an emergency. Your doctor can do tests to check your blood sugar level. You can have low blood sugar and not have diabetes. HOME CARE  Check your blood sugar as told by your doctor. If it is less than 70 mg/dl or as told by your doctor, take 1 of the following:  3 to 4 glucose tablets.   cup clear juice.   cup soda pop, not diet.  1 cup milk.  5 to 6 hard candies.  Recheck blood sugar after 15 minutes. Repeat until it is at the right level.  Eat a snack if it is more than 1 hour until the next meal.  Only take medicine as told by your doctor.  Do not skip meals. Eat on time.  Do not drink alcohol except with meals.  Check your blood glucose before driving.  Check your blood glucose before and after exercise.  Always carry treatment with you, such as glucose pills.  Always wear a medical alert bracelet if you have diabetes. GET HELP RIGHT AWAY IF:   Your blood glucose goes below 70 mg/dl or as told by your doctor, and you:  Are confused.  Are not able to swallow.  Pass out (faint).  You cannot treat yourself. You may need someone to help you.  You have low blood sugar problems often.  You have problems from your medicines.  You are not feeling better after 3 to 4 days.  You have vision changes. MAKE SURE YOU:   Understand these instructions.  Will watch this condition.  Will get help right away if you are not doing well or get worse.   This information is not intended to replace advice given to you by your health care provider. Make sure you discuss any questions you have with your health care provider.     Document Released: 04/07/2009 Document Revised: 02/01/2014 Document Reviewed: 09/17/2014 Elsevier Interactive Patient Education 2016 [employer]  Hypoglycemia Low blood sugar (hypoglycemia) means that the level of sugar in your blood is lower than it should be. Signs of low blood sugar include:  Getting sweaty.  Feeling hungry.  Feeling dizzy or weak.  Feeling sleepier than normal.  Feeling nervous.  Headaches.  Having a fast heartbeat. Low blood sugar can happen fast and can be an emergency. Your doctor can do tests to check your blood sugar level. You can have low blood sugar and not have diabetes. HOME CARE  Check your blood sugar as told by your doctor. If it is less than 70 mg/dl or as told by your doctor, take 1 of the following:  3 to 4 glucose tablets.   cup clear juice.   cup soda pop, not diet.  1 cup milk.  5 to 6 hard candies.  Recheck blood sugar after 15 minutes. Repeat until it is at the right level.  Eat a snack if it is more than 1 hour until the next meal.  Only take medicine as told by your doctor.  Do not skip meals. Eat on time.  Do not drink alcohol except with meals.  Check your blood glucose before driving.  Check your blood  glucose before and after exercise.  Always carry treatment with you, such as glucose pills.  Always wear a medical alert bracelet if you have diabetes. GET HELP RIGHT AWAY IF:   Your blood glucose goes below 70 mg/dl or as told by your doctor, and you:  Are confused.  Are not able to swallow.  Pass out (faint).  You cannot treat yourself. You may need someone to help you.  You have low blood sugar problems often.  You have problems from your medicines.  You are not feeling better after 3 to 4 days.  You have vision changes. MAKE SURE YOU:   Understand these instructions.  Will watch this condition.  Will get help right away if you are not doing well or get worse.   This information is not  intended to replace advice given to you by your health care provider. Make sure you discuss any questions you have with your health care provider.   Document Released: 04/07/2009 Document Revised: 02/01/2014 Document Reviewed: 09/17/2014 Elsevier Interactive Patient Education 2016 [employer].  Hold your evening insulin. Call your diabetes doctor tomorrow for further recommendations.

## 2015-03-12 NOTE — ED Provider Notes (Signed)
CSN: [id number]     Arrival date & time 03/12/15  1324 History   First MD Initiated Contact with Patient 03/12/15 1401     Chief Complaint  Patient presents with  . Hypoglycemia     (Consider location/radiation/quality/duration/timing/severity/associated sxs/prior Treatment) HPI Comments: [name] w/ PMH including IDDM, HTN, HLD, asthma, epilepsy, mild MR who p/w hypoglycemia. Patient states that this morning she began feeling like her blood sugar was low. She checked it and it was low so she ate some cookies. Her mom recommended ED evaluation and at recheck her blood sugar was still low in the 50s. She has had some juice out in triage. She states her PCP decreased some of her insulin regimen last week and she denies any recent increase in insulin. She denies any fevers, cough/cold symptoms, vomiting, abdominal pain, urinary symptoms, or recent illness. She otherwise feels okay.  Patient is a 50 y.o. female presenting with hypoglycemia. The history is provided by the patient.  Hypoglycemia   Past Medical History  Diagnosis Date  . Mental retardation   . Pseudoseizures   . Obesity   . Hypertension   . Dyslipidemia   . Asthma   . Diabetes mellitus   . Epileptic seizures [facility])    Past Surgical History  Procedure Laterality Date  . Reduction mammaplasty Bilateral ~ 1988  . Breast cyst excision Left ~ 1988   Family History  Problem Relation Age of Onset  . Osteoarthritis Mother   . Diabetes Mother   . Hypertension Mother   . Clotting disorder Mother     blood disorder  . Cancer Father     Lung  . Seizures Father   . Cancer Maternal Grandmother   . Heart attack Paternal Grandmother    Social History  Substance Use Topics  . Smoking status: Never Smoker   . Smokeless tobacco: Never Used  . Alcohol Use: No   OB History    No data available     Review of Systems 10 Systems reviewed and are negative for acute change except as noted in the HPI.    Allergies  Dilantin;  Furosemide; Shellfish allergy; Depakote; Lip balm; Penicillins; and Latex  Home Medications   Prior to Admission medications   Medication Sig Start Date End Date Taking? Authorizing Provider  albuterol (PROVENTIL HFA;VENTOLIN HFA) 108 (90 BASE) MCG/ACT inhaler Inhale 2 puffs into the lungs every 6 (six) hours as needed for wheezing.    Yes Historical Provider, MD  amLODipine (NORVASC) 5 MG tablet  09/05/14  Yes Historical Provider, MD  aspirin 81 MG tablet Take 81 mg by mouth daily.   Yes Historical Provider, MD  benzonatate (TESSALON) 100 MG capsule Take by mouth 2 (two) times daily as needed for cough.   Yes Historical Provider, MD  insulin aspart (NOVOLOG FLEXPEN) 100 UNIT/ML FlexPen Inject 0-9 Units into the skin 3 (three) times daily with meals. Sliding scale Patient taking differently: Inject 5-6 Units into the skin 2 (two) times daily. Sliding scale 09/16/14  Yes [name], MD  LAMICTAL 200 MG tablet TAKE 1 TABLET BY MOUTH TWICE DAILY 03/03/14  Yes [name], MD  LEVEMIR FLEXTOUCH 100 UNIT/ML Pen Inject 7 Units into the skin every morning.  09/24/14  Yes Historical Provider, MD  levETIRAcetam (KEPPRA) 1000 MG tablet Take one and one half tablets each morning and take two tablets each evening 03/03/14  Yes [name], MD  Liraglutide (VICTOZA) 18 MG/3ML SOPN 0.6  Mg into the  skin per day Patient taking differently: Take 6.2 mg by mouth daily. 1.2  Mg into the skin per day 09/16/14  Yes [name], MD  loratadine (CLARITIN) 10 MG tablet Take 10 mg by mouth daily.   Yes Historical Provider, MD  montelukast (SINGULAIR) 10 MG tablet Take 10 mg by mouth at bedtime.   Yes Historical Provider, MD  Multiple Vitamin (MULTIVITAMIN WITH MINERALS) TABS Take 1 tablet by mouth daily.   Yes Historical Provider, MD  omeprazole (PRILOSEC) 20 MG capsule Take 20 mg by mouth daily.   Yes Historical Provider, MD  pioglitazone-metformin (ACTOPLUS MET) 15-850 MG per tablet Take 1 tablet by mouth daily with  breakfast.   Yes Historical Provider, MD  pravastatin (PRAVACHOL) 40 MG tablet Take 40 mg by mouth daily.  06/10/14  Yes Historical Provider, MD  ramipril (ALTACE) 5 MG capsule TAKE 1 CAPSULE BY MOUTH EVERY DAY. 10/31/14  Yes [name], MD  vitamin C (ASCORBIC ACID) 500 MG tablet Take 500 mg by mouth daily.   Yes Historical Provider, MD  vitamin E 400 UNIT capsule Take 400 Units by mouth daily.   Yes Historical Provider, MD   BP 101/50 mmHg  Pulse 93  Temp(Src) 97.9 F (36.6 C)  Resp 18  SpO2 100% Physical Exam  Constitutional: She is oriented to person, place, and time. She appears well-developed and well-nourished. No distress.  HENT:  Head: Normocephalic and atraumatic.  Moist mucous membranes  Eyes: Conjunctivae are normal. Pupils are equal, round, and reactive to light.  Neck: Neck supple.  Cardiovascular: Normal rate, regular rhythm and normal heart sounds.   No murmur heard. Pulmonary/Chest: Effort normal and breath sounds normal.  Abdominal: Soft. Bowel sounds are normal. She exhibits no distension. There is no tenderness.  Musculoskeletal: She exhibits no edema.  Neurological: She is alert and oriented to person, place, and time.  Fluent speech  Skin: Skin is warm and dry.  Psychiatric: She has a normal mood and affect. Judgment normal.  Nursing note and vitals reviewed.   ED Course  Procedures (including critical care time) Labs Review Labs Reviewed  COMPREHENSIVE METABOLIC PANEL - Abnormal; Notable for the following:    CO2 21 (*)    All other components within normal limits  CBC WITH DIFFERENTIAL/PLATELET - Abnormal; Notable for the following:    Hemoglobin 11.8 (*)    All other components within normal limits  CBG MONITORING, ED - Abnormal; Notable for the following:    Glucose-Capillary 50 (*)    All other components within normal limits  CBG MONITORING, ED - Abnormal; Notable for the following:    Glucose-Capillary 51 (*)    All other components within  normal limits  CBG MONITORING, ED - Abnormal; Notable for the following:    Glucose-Capillary 166 (*)    All other components within normal limits  URINALYSIS, ROUTINE W REFLEX MICROSCOPIC (NOT AT [facility])    Imaging Review No results found. I have personally reviewed and evaluated these lab results as part of my medical decision-making.   EKG Interpretation None     Medications  dextrose 50 % solution 50 mL (50 mLs Intravenous Given 03/12/15 1453)    MDM   Final diagnoses:  Hypoglycemia    Patient presents with hypoglycemia this morning without any other infectious symptoms. Blood glucose was 51 in triage and she was given orange juice, repeat blood glucose was 50. Establish IV access and gave the patient D50 amp. She was well appearing and mentating appropriately  on exam. Obtained above lab work to evaluate for electrolyte derangement or signs of infection. Observed the patient and performed glucose rechecks. Repeat BG after D50 and food is 166.  Labs are unremarkable and show no signs of infection. We will observe the patient for a few hours to ensure that her BG does not continue to drop. I anticipate discharge if her BG remains stable. Pt signed out to oncoming provider, Dr. Lacinda Axon.   Sharlett Iles, MD 03/12/15 [phone]

## 2015-03-12 NOTE — ED Provider Notes (Signed)
Glucose was 147 at 1911 and 184 at 1644.  Patient instructed to hold evening insulin. She is stable.  Dalicia Kisner,Donnetta Hutching15/17 [phone]

## 2015-03-12 NOTE — ED Notes (Signed)
CBG 50 

## 2015-03-12 NOTE — ED Notes (Signed)
Pt here for feeling as if her blood sugar dropped this am. sts she ate this am and took her insulin. sts she was seating and her mother gave her some cookies.

## 2015-03-13 DIAGNOSIS — E1165 Type 2 diabetes mellitus with hyperglycemia: Secondary | ICD-10-CM | POA: Diagnosis not present

## 2015-03-13 DIAGNOSIS — I10 Essential (primary) hypertension: Secondary | ICD-10-CM | POA: Diagnosis not present

## 2015-03-13 DIAGNOSIS — G40909 Epilepsy, unspecified, not intractable, without status epilepticus: Secondary | ICD-10-CM | POA: Diagnosis not present

## 2015-03-14 DIAGNOSIS — Z794 Long term (current) use of insulin: Secondary | ICD-10-CM | POA: Diagnosis not present

## 2015-03-14 DIAGNOSIS — E119 Type 2 diabetes mellitus without complications: Secondary | ICD-10-CM | POA: Diagnosis not present

## 2015-03-14 DIAGNOSIS — M6281 Muscle weakness (generalized): Secondary | ICD-10-CM | POA: Diagnosis not present

## 2015-03-14 DIAGNOSIS — I10 Essential (primary) hypertension: Secondary | ICD-10-CM | POA: Diagnosis not present

## 2015-03-14 DIAGNOSIS — G40209 Localization-related (focal) (partial) symptomatic epilepsy and epileptic syndromes with complex partial seizures, not intractable, without status epilepticus: Secondary | ICD-10-CM | POA: Diagnosis not present

## 2015-03-14 DIAGNOSIS — F79 Unspecified intellectual disabilities: Secondary | ICD-10-CM | POA: Diagnosis not present

## 2015-03-20 DIAGNOSIS — E139 Other specified diabetes mellitus without complications: Secondary | ICD-10-CM | POA: Diagnosis not present

## 2015-03-27 DIAGNOSIS — E139 Other specified diabetes mellitus without complications: Secondary | ICD-10-CM | POA: Diagnosis not present

## 2015-04-01 DIAGNOSIS — I10 Essential (primary) hypertension: Secondary | ICD-10-CM | POA: Diagnosis not present

## 2015-04-01 DIAGNOSIS — Z794 Long term (current) use of insulin: Secondary | ICD-10-CM | POA: Diagnosis not present

## 2015-04-01 DIAGNOSIS — E119 Type 2 diabetes mellitus without complications: Secondary | ICD-10-CM | POA: Diagnosis not present

## 2015-04-01 DIAGNOSIS — M6281 Muscle weakness (generalized): Secondary | ICD-10-CM | POA: Diagnosis not present

## 2015-04-01 DIAGNOSIS — G40209 Localization-related (focal) (partial) symptomatic epilepsy and epileptic syndromes with complex partial seizures, not intractable, without status epilepticus: Secondary | ICD-10-CM | POA: Diagnosis not present

## 2015-04-01 DIAGNOSIS — F79 Unspecified intellectual disabilities: Secondary | ICD-10-CM | POA: Diagnosis not present

## 2015-04-03 DIAGNOSIS — E139 Other specified diabetes mellitus without complications: Secondary | ICD-10-CM | POA: Diagnosis not present

## 2015-04-03 DIAGNOSIS — R05 Cough: Secondary | ICD-10-CM | POA: Diagnosis not present

## 2015-04-07 DIAGNOSIS — E1165 Type 2 diabetes mellitus with hyperglycemia: Secondary | ICD-10-CM | POA: Diagnosis not present

## 2015-04-14 DIAGNOSIS — F79 Unspecified intellectual disabilities: Secondary | ICD-10-CM | POA: Diagnosis not present

## 2015-04-14 DIAGNOSIS — Z794 Long term (current) use of insulin: Secondary | ICD-10-CM | POA: Diagnosis not present

## 2015-04-14 DIAGNOSIS — Z7984 Long term (current) use of oral hypoglycemic drugs: Secondary | ICD-10-CM | POA: Diagnosis not present

## 2015-04-14 DIAGNOSIS — G40209 Localization-related (focal) (partial) symptomatic epilepsy and epileptic syndromes with complex partial seizures, not intractable, without status epilepticus: Secondary | ICD-10-CM | POA: Diagnosis not present

## 2015-04-14 DIAGNOSIS — M6281 Muscle weakness (generalized): Secondary | ICD-10-CM | POA: Diagnosis not present

## 2015-04-14 DIAGNOSIS — E119 Type 2 diabetes mellitus without complications: Secondary | ICD-10-CM | POA: Diagnosis not present

## 2015-04-14 DIAGNOSIS — I10 Essential (primary) hypertension: Secondary | ICD-10-CM | POA: Diagnosis not present

## 2015-05-01 ENCOUNTER — Other Ambulatory Visit: Payer: Self-pay | Admitting: Endocrinology

## 2015-05-01 DIAGNOSIS — E118 Type 2 diabetes mellitus with unspecified complications: Secondary | ICD-10-CM | POA: Diagnosis not present

## 2015-05-01 DIAGNOSIS — I10 Essential (primary) hypertension: Secondary | ICD-10-CM | POA: Diagnosis not present

## 2015-05-06 ENCOUNTER — Other Ambulatory Visit: Payer: Self-pay

## 2015-05-06 DIAGNOSIS — R05 Cough: Secondary | ICD-10-CM | POA: Diagnosis not present

## 2015-05-06 DIAGNOSIS — I10 Essential (primary) hypertension: Secondary | ICD-10-CM | POA: Diagnosis not present

## 2015-05-06 DIAGNOSIS — J45909 Unspecified asthma, uncomplicated: Secondary | ICD-10-CM | POA: Diagnosis not present

## 2015-05-06 DIAGNOSIS — Z1231 Encounter for screening mammogram for malignant neoplasm of breast: Secondary | ICD-10-CM

## 2015-05-22 DIAGNOSIS — R05 Cough: Secondary | ICD-10-CM | POA: Diagnosis not present

## 2015-05-22 DIAGNOSIS — I10 Essential (primary) hypertension: Secondary | ICD-10-CM | POA: Diagnosis not present

## 2015-05-22 DIAGNOSIS — E1165 Type 2 diabetes mellitus with hyperglycemia: Secondary | ICD-10-CM | POA: Diagnosis not present

## 2015-05-22 DIAGNOSIS — K7689 Other specified diseases of liver: Secondary | ICD-10-CM | POA: Diagnosis not present

## 2015-05-26 ENCOUNTER — Other Ambulatory Visit: Payer: Self-pay | Admitting: Internal Medicine

## 2015-05-26 ENCOUNTER — Ambulatory Visit: Payer: Medicare Other

## 2015-05-26 DIAGNOSIS — R945 Abnormal results of liver function studies: Secondary | ICD-10-CM

## 2015-06-02 ENCOUNTER — Other Ambulatory Visit: Payer: Self-pay | Admitting: Endocrinology

## 2015-06-03 DIAGNOSIS — R0600 Dyspnea, unspecified: Secondary | ICD-10-CM | POA: Diagnosis not present

## 2015-06-03 DIAGNOSIS — R062 Wheezing: Secondary | ICD-10-CM | POA: Diagnosis not present

## 2015-06-03 DIAGNOSIS — R05 Cough: Secondary | ICD-10-CM | POA: Diagnosis not present

## 2015-06-03 DIAGNOSIS — K219 Gastro-esophageal reflux disease without esophagitis: Secondary | ICD-10-CM | POA: Diagnosis not present

## 2015-06-03 DIAGNOSIS — T161XXA Foreign body in right ear, initial encounter: Secondary | ICD-10-CM | POA: Diagnosis not present

## 2015-06-05 ENCOUNTER — Other Ambulatory Visit: Payer: Medicare Other

## 2015-06-10 ENCOUNTER — Institutional Professional Consult (permissible substitution): Payer: Medicare Other | Admitting: Internal Medicine

## 2015-06-10 ENCOUNTER — Ambulatory Visit
Admission: RE | Admit: 2015-06-10 | Discharge: 2015-06-10 | Disposition: A | Discharge status: Home / Self Care | Payer: Medicare Other | Source: Ambulatory Visit

## 2015-06-10 DIAGNOSIS — Z1231 Encounter for screening mammogram for malignant neoplasm of breast: Secondary | ICD-10-CM | POA: Diagnosis not present

## 2015-06-12 ENCOUNTER — Other Ambulatory Visit: Payer: Self-pay | Admitting: Endocrinology

## 2015-06-12 ENCOUNTER — Telehealth: Payer: Self-pay | Admitting: Endocrinology

## 2015-06-12 NOTE — Telephone Encounter (Signed)
She needs to be seen for any prescriptions

## 2015-06-12 NOTE — Telephone Encounter (Signed)
See note below and please advise if ok to change to humalog.

## 2015-06-12 NOTE — Telephone Encounter (Signed)
[facility] called and said the the Novolog is no longer covered under insurance and they need permission to switch to Humulog.  Also, the Victoza is out of refills as well. Fax # [phone]

## 2015-06-12 NOTE — Telephone Encounter (Signed)
Pt advised of note below and voiced understanding. Pt scheduled follow up

## 2015-06-13 ENCOUNTER — Other Ambulatory Visit: Payer: Self-pay | Admitting: Endocrinology

## 2015-07-03 ENCOUNTER — Other Ambulatory Visit: Payer: Self-pay | Admitting: Endocrinology

## 2015-07-10 ENCOUNTER — Other Ambulatory Visit: Payer: Medicare Other

## 2015-07-15 ENCOUNTER — Ambulatory Visit: Payer: Medicare Other | Admitting: Endocrinology

## 2015-07-15 DIAGNOSIS — Z0289 Encounter for other administrative examinations: Secondary | ICD-10-CM

## 2015-07-22 DIAGNOSIS — I10 Essential (primary) hypertension: Secondary | ICD-10-CM | POA: Diagnosis not present

## 2015-07-22 DIAGNOSIS — E1165 Type 2 diabetes mellitus with hyperglycemia: Secondary | ICD-10-CM | POA: Diagnosis not present

## 2015-07-25 DIAGNOSIS — R05 Cough: Secondary | ICD-10-CM | POA: Diagnosis not present

## 2015-07-25 DIAGNOSIS — I10 Essential (primary) hypertension: Secondary | ICD-10-CM | POA: Diagnosis not present

## 2015-07-25 DIAGNOSIS — E1165 Type 2 diabetes mellitus with hyperglycemia: Secondary | ICD-10-CM | POA: Diagnosis not present

## 2015-09-16 ENCOUNTER — Ambulatory Visit: Payer: Medicare Other | Admitting: Neurology

## 2015-10-08 DIAGNOSIS — Z23 Encounter for immunization: Secondary | ICD-10-CM | POA: Diagnosis not present

## 2015-12-09 ENCOUNTER — Other Ambulatory Visit: Payer: Self-pay | Admitting: Neurology

## 2015-12-11 ENCOUNTER — Telehealth: Payer: Self-pay

## 2015-12-11 ENCOUNTER — Other Ambulatory Visit: Payer: Self-pay | Admitting: Neurology

## 2015-12-11 NOTE — Telephone Encounter (Signed)
Ok to refill, pls send refills until her next visit. Thanks

## 2015-12-11 NOTE — Telephone Encounter (Signed)
Refill sent to pharmacy for Lamictal.

## 2015-12-11 NOTE — Telephone Encounter (Signed)
Notified patient we sent in refill for Lamictal but she needed to make a follow-up appointment. Patient verbalized understanding and transferred to front desk to schedule.

## 2015-12-11 NOTE — Telephone Encounter (Signed)
Patient's last OV 02/04/15. Patient instructed to continue Lamictal 200mg  BID at that visit. Prescribed by historical provider. Okay to refill?

## 2015-12-23 DIAGNOSIS — R05 Cough: Secondary | ICD-10-CM | POA: Diagnosis not present

## 2015-12-23 DIAGNOSIS — I10 Essential (primary) hypertension: Secondary | ICD-10-CM | POA: Diagnosis not present

## 2015-12-30 ENCOUNTER — Other Ambulatory Visit: Payer: Self-pay | Admitting: Neurology

## 2015-12-30 NOTE — Telephone Encounter (Signed)
RX request refused: Sent refill to pharmacy and noted received 12/11/15.

## 2016-01-02 DIAGNOSIS — Z23 Encounter for immunization: Secondary | ICD-10-CM | POA: Diagnosis not present

## 2016-01-02 DIAGNOSIS — E1165 Type 2 diabetes mellitus with hyperglycemia: Secondary | ICD-10-CM | POA: Diagnosis not present

## 2016-01-21 DIAGNOSIS — R05 Cough: Secondary | ICD-10-CM | POA: Diagnosis not present

## 2016-01-22 DIAGNOSIS — E1165 Type 2 diabetes mellitus with hyperglycemia: Secondary | ICD-10-CM | POA: Diagnosis not present

## 2016-01-22 DIAGNOSIS — I10 Essential (primary) hypertension: Secondary | ICD-10-CM | POA: Diagnosis not present

## 2016-01-23 DIAGNOSIS — I10 Essential (primary) hypertension: Secondary | ICD-10-CM | POA: Diagnosis not present

## 2016-01-23 DIAGNOSIS — E1165 Type 2 diabetes mellitus with hyperglycemia: Secondary | ICD-10-CM | POA: Diagnosis not present

## 2016-01-28 ENCOUNTER — Encounter: Payer: Self-pay | Admitting: Neurology

## 2016-01-28 ENCOUNTER — Ambulatory Visit (INDEPENDENT_AMBULATORY_CARE_PROVIDER_SITE_OTHER): Payer: Medicare Other | Admitting: Neurology

## 2016-01-28 VITALS — BP 126/78 | HR 98 | Ht 63.0 in | Wt 216.5 lb

## 2016-01-28 DIAGNOSIS — F09 Unspecified mental disorder due to known physiological condition: Secondary | ICD-10-CM

## 2016-01-28 DIAGNOSIS — G40209 Localization-related (focal) (partial) symptomatic epilepsy and epileptic syndromes with complex partial seizures, not intractable, without status epilepticus: Secondary | ICD-10-CM

## 2016-01-28 MED ORDER — LAMOTRIGINE 200 MG PO TABS: 200.0000 mg | ORAL_TABLET | Freq: Two times a day (BID) | ORAL | 3 refills | Status: DC

## 2016-01-28 MED ORDER — LEVETIRACETAM 1000 MG PO TABS: ORAL_TABLET | ORAL | 3 refills | Status: DC

## 2016-01-28 NOTE — Patient Instructions (Signed)
1. Continue all your medications 2. Follow-up in 1 year, call for any changes  Seizure Precautions: 1. If medication has been prescribed for you to prevent seizures, take it exactly as directed.  Do not stop taking the medicine without talking to your doctor first, even if you have not had a seizure in a long time.   2. Avoid activities in which a seizure would cause danger to yourself or to others.  Don't operate dangerous machinery, swim alone, or climb in high or dangerous places, such as on ladders, roofs, or girders.  Do not drive unless your doctor says you may.  3. If you have any warning that you may have a seizure, lay down in a safe place where you can't hurt yourself.    4.  No driving for 6 months from last seizure, as per [place] state law.   Please refer to the following link on the [employer]'s website for more information: http://www.epilepsyfoundation.org/answerplace/Social/driving/drivingu.cfm   5.  Maintain good sleep hygiene. Avoid alcohol.  6.  Contact your doctor if you have any problems that may be related to the medicine you are taking.  7.  Call 911 and bring the patient back to the ED if:        A.  The seizure lasts longer than 5 minutes.       B.  The patient doesn't awaken shortly after the seizure  C.  The patient has new problems such as difficulty seeing, speaking or moving  D.  The patient was injured during the seizure  E.  The patient has a temperature over 102 F (39C)  F.  The patient vomited and now is having trouble breathing         

## 2016-01-28 NOTE — Progress Notes (Signed)
NEUROLOGY FOLLOW UP OFFICE NOTE  Tammy Arellano [id number]  HISTORY OF PRESENT ILLNESS: I had the pleasure of seeing Tammy Arellano in follow-up in the neurology clinic on 01/28/2016.  The patient was initially seen a year ago for recurrent seizures and is again accompanied by her mother who helps supplement the history today.  Since her last visit, she reports having a good year with no seizures on Keppra 1559m in AM, 20079min PM and Lamotrigine 20028mID. No side effects on medications. Last seizure on record was August 2016, at that time her blood sugar was 37. She had multiple seizure in the ER that appeared epileptic per report, however when Neurology evaluated her, she was noted to have bilateral arm shaking and turning her head side to side, and said she was having a seizure when asked. When pain was elicited, the shaking stopped. The episode witnessed by Neurology was concerning for a non-epileptic event, however per notes the previous seizures noted by ER "looked very realistic." She is now helping her mother who was diagnosed with breast cancer. She denies any missed medications, she is in charge of her own medication and remembers to take them. She denies any headaches, dizziness, diplopia, focal numbness/tingling/weakness. Her main symptoms are frequent bowel movements after every meal and cough, which she continues to see her PCP for.  HPI: This is a pleasant [age] LH woman with a history of diabetes, hypertension, hyperlipidemia, and seizures since age 51er mother reports she was found at a bus stop with the first seizure. She has had seizures without prior warning, other times she would feel tired, dizzy, and confused, then people would tell her she was shaking and unresponsive. She has had nocturnal seizures as well, where her mother would hear her making funny gurgling sounds. Her mother has witnessed that a week prior to having a convulsion, she would repeat things, have a blank  stare. Last convulsion was 07/01/14, her mother heard a sound in the utility room and found her on the floor. Two weeks ago, she had a seizure at church where her sister reported she had a "goofy look," unresponsive, with no convulsive activity. She was amnestic of this. She denies any post-ictal focal weakness, usually she feels tired and drunk ("I can't walk straight"). Her longest seizure-free interval was 2 years. On review of prior records on [facility], she was on Depakote which caused weight gain and low white count. Her mother reports she had swelling with Dilantin. She has been on Keppra for many years, and switched from Depakote to Lamictal for at least 6 years. She has some problems with medication compliance, with seizures occurring when she misses medications, however she reports having breakthrough seizures even when taking medication regularly.   She carries a diagnosis of pseudoseizures as well. On review of records, in 2009, she was reported to have a pseudoseizure during EEG setup, witnessed by EEG technician. She was described as complaining of head hurting with room spinning and dizziness, during the episode of body shaking and flapping at her side, she bit her tongue, lasting less than 5 minutes. She was immediately conversing after the event. EEG was within normal limits. There is an EEG report by Dr. LovErling Cruzom 2007 with abnormal photic response with high-amplitude sharp and slow wave activity noticed synchronously in the temporal and occipital regions. No abnormality seen outside of photic stimulation. No MRI studies available for review. I personally reviewed head CT without contrast done 08/25/2014 which  was unremarkable.  She was born with forceps delivery and had ?scalp hematoma. She had delay with motor and speech milestones and was in special education classes, diagnosed with a learning disability. She is unemployed and does not drive. She lives with her mother. She denies any  olfactory/gustatory hallucinations, deja vu, rising epigastric sensation, focal numbness/tingling/weakness, myoclonic jerks. Her mother, however, reports body jerks.   Epilepsy Risk Factors: Her maternal grandfather had seizures, her father started having seizures in his 86s. She was delivered via forceps delivery and has mild cognitive impairment and delay with milestones. Otherwise there is no history of febrile convulsions, CNS infections such as meningitis/encephalitis, significant traumatic brain injury, neurosurgical procedures  Prior AEDs: Dilantin, Depakote  Diagnostic Data: her 1-hour awake and asleep EEG done 02/10/2015 was normal. She has not done MRI brain. Head CT in 07/2014 was unremarkable.  PAST MEDICAL HISTORY: Past Medical History:  Diagnosis Date  . Asthma   . Diabetes mellitus   . Dyslipidemia   . Epileptic seizures ([place])   . Hypertension   . Mental retardation   . Obesity   . Pseudoseizures     MEDICATIONS: Current Outpatient Prescriptions on File Prior to Visit  Medication Sig Dispense Refill  . albuterol (PROVENTIL HFA;VENTOLIN HFA) 108 (90 BASE) MCG/ACT inhaler Inhale 2 puffs into the lungs every 6 (six) hours as needed for wheezing.     Marland Kitchen amLODipine (NORVASC) 5 MG tablet     . aspirin 81 MG tablet Take 81 mg by mouth daily.    . benzonatate (TESSALON) 100 MG capsule Take by mouth 2 (two) times daily as needed for cough.    . insulin aspart (NOVOLOG FLEXPEN) 100 UNIT/ML FlexPen Inject 0-9 Units into the skin 3 (three) times daily with meals. Sliding scale (Patient taking differently: Inject 5-6 Units into the skin 2 (two) times daily. Sliding scale) 15 mL 1  . LAMICTAL 200 MG tablet TAKE 1 TABLET BY MOUTH TWICE DAILY 180 tablet 1  . LEVEMIR FLEXTOUCH 100 UNIT/ML Pen Inject 7 Units into the skin every morning.     . levETIRAcetam (KEPPRA) 1000 MG tablet Take one and one half tablets each morning and take two tablets each evening 315 tablet 1  . loratadine  (CLARITIN) 10 MG tablet Take 10 mg by mouth daily.    . montelukast (SINGULAIR) 10 MG tablet Take 10 mg by mouth at bedtime.    . Multiple Vitamin (MULTIVITAMIN WITH MINERALS) TABS Take 1 tablet by mouth daily.    Marland Kitchen omeprazole (PRILOSEC) 20 MG capsule Take 20 mg by mouth daily.    . pioglitazone-metformin (ACTOPLUS MET) 15-850 MG per tablet Take 1 tablet by mouth daily with breakfast.    . pravastatin (PRAVACHOL) 40 MG tablet Take 40 mg by mouth daily.     . ramipril (ALTACE) 5 MG capsule TAKE 1 CAPSULE BY MOUTH EVERY DAY. 30 capsule 3  . VICTOZA 18 MG/3ML SOPN INJECT 0.'6MG'$  INTO THE SKIN ONCE DAILY. 3 mL 0  . vitamin C (ASCORBIC ACID) 500 MG tablet Take 500 mg by mouth daily.    . vitamin E 400 UNIT capsule Take 400 Units by mouth daily.     No current facility-administered medications on file prior to visit.     ALLERGIES: Allergies  Allergen Reactions  . Dilantin [Phenytoin Sodium Extended] Anaphylaxis    Induces seizures   . Furosemide Anaphylaxis  . Shellfish Allergy Anaphylaxis  . Depakote [Divalproex Sodium] Other (See Comments)    Leukopenia and weight  gain  . Lip Balm [Chapstick] Other (See Comments)    Lips swell only chapstick brand  . Penicillins Nausea And Vomiting  . Latex Rash    FAMILY HISTORY: Family History  Problem Relation Age of Onset  . Osteoarthritis Mother   . Diabetes Mother   . Hypertension Mother   . Clotting disorder Mother     blood disorder  . Cancer Father     Lung  . Seizures Father   . Cancer Maternal Grandmother   . Heart attack Paternal Grandmother     SOCIAL HISTORY: Social History   Social History  . Marital status: Single    Spouse name: N/A  . Number of children: 0  . Years of education: 12   Occupational History  . Not on file.   Social History Main Topics  . Smoking status: Never Smoker  . Smokeless tobacco: Never Used  . Alcohol use No  . Drug use: No  . Sexual activity: No   Other Topics Concern  . Not on file     Social History Narrative   Patient is single with no children and resides with her mother.   Patient is left handed          REVIEW OF SYSTEMS: Constitutional: No fevers, chills, or sweats, no generalized fatigue, change in appetite Eyes: No visual changes, double vision, eye pain Ear, nose and throat: No hearing loss, ear pain, nasal congestion, sore throat Cardiovascular: No chest pain, palpitations Respiratory:  No shortness of breath at rest or with exertion, wheezes GastrointestinaI: No nausea, vomiting, diarrhea, abdominal pain, fecal incontinence Genitourinary:  No dysuria, urinary retention or frequency Musculoskeletal:  No neck pain, back pain Integumentary: No rash, pruritus, skin lesions Neurological: as above Psychiatric: No depression, insomnia, anxiety Endocrine: No palpitations, fatigue, diaphoresis, mood swings, change in appetite, change in weight, increased thirst Hematologic/Lymphatic:  No anemia, purpura, petechiae. Allergic/Immunologic: no itchy/runny eyes, nasal congestion, recent allergic reactions, rashes  PHYSICAL EXAM: Vitals:   01/28/16 0926  BP: 126/78  Pulse: 98   General: No acute distress Head:  Normocephalic/atraumatic Neck: supple, no paraspinal tenderness, full range of motion Heart:  Regular rate and rhythm Lungs:  Clear to auscultation bilaterally Back: No paraspinal tenderness Skin/Extremities: No rash, no edema Neurological Exam: alert and oriented to person, place, and time. No aphasia or dysarthria. Fund of knowledge is appropriate.  Recent and remote memory are impaired.  Attention and concentration are normal.    Able to name objects and repeat phrases. Cranial nerves: Pupils equal, round, reactive to light.  Extraocular movements intact with no nystagmus. Visual fields full. Facial sensation intact. No facial asymmetry. Tongue, uvula, palate midline.  Motor: Bulk and tone normal, muscle strength 5/5 throughout with no pronator drift.   Sensation to light touch intact.  No extinction to double simultaneous stimulation.  Deep tendon reflexes 2+ throughout, toes downgoing.  Finger to nose testing intact.  Gait wide-based, no ataxia, able to tandem walk adequately (similar to prior).  Romberg negative.  IMPRESSION: This is a pleasant 51 yo LH woman with a history of diabetes, hypertension, hyperlipidemia, mild cognitive impairment, and seizures since age 63. By semiology, family has witnessed her with blank stare, unresponsiveness for several minutes, which may or may not progress to a convulsion, suggestive of focal to bilateral tonic-clonic epilepsy. A prior EEG in 2007 reported high-amplitude sharp and slow wave activity noticed synchronously in the temporal and occipital regions. There is also concern for non-epileptic events. She may  have co-existing epilepsy and non-epileptic events, but no seizures since August 2016. At that time, she was hypoglycemic down to the 30s and 40s. Continue Keppra 1564m in AM, 20061min PM and Lamictal 20011mID. Her recent EEG was normal. We can hold off on MRI brain for now, but if seizures recur, would proceed with MRI. She does not drive. She will follow-up in 1 year and knows to call our office for any changes.   Thank you for allowing me to participate in her care.  Please do not hesitate to call for any questions or concerns.  The duration of this appointment visit was 15 minutes of face-to-face time with the patient.  Greater than 50% of this time was spent in counseling, explanation of diagnosis, planning of further management, and coordination of care.   KarEllouise Newer.D.   CC: Dr. KimMaudie Mercury

## 2016-02-09 ENCOUNTER — Ambulatory Visit: Payer: Medicare Other | Admitting: Neurology

## 2016-03-03 DIAGNOSIS — I10 Essential (primary) hypertension: Secondary | ICD-10-CM | POA: Diagnosis not present

## 2016-03-03 DIAGNOSIS — R197 Diarrhea, unspecified: Secondary | ICD-10-CM | POA: Diagnosis not present

## 2016-03-08 DIAGNOSIS — K529 Noninfective gastroenteritis and colitis, unspecified: Secondary | ICD-10-CM | POA: Diagnosis not present

## 2016-03-08 DIAGNOSIS — R197 Diarrhea, unspecified: Secondary | ICD-10-CM | POA: Diagnosis not present

## 2016-04-18 ENCOUNTER — Encounter (HOSPITAL_COMMUNITY): Payer: Self-pay

## 2016-04-18 ENCOUNTER — Emergency Department (HOSPITAL_COMMUNITY)
Admission: EM | Admit: 2016-04-18 | Discharge: 2016-04-19 | Disposition: A | Discharge status: Home / Self Care | Payer: Medicare Other | Attending: Emergency Medicine | Admitting: Emergency Medicine

## 2016-04-18 DIAGNOSIS — Z794 Long term (current) use of insulin: Secondary | ICD-10-CM | POA: Insufficient documentation

## 2016-04-18 DIAGNOSIS — Z79899 Other long term (current) drug therapy: Secondary | ICD-10-CM | POA: Insufficient documentation

## 2016-04-18 DIAGNOSIS — Z9114 Patient's other noncompliance with medication regimen: Secondary | ICD-10-CM | POA: Diagnosis not present

## 2016-04-18 DIAGNOSIS — J45909 Unspecified asthma, uncomplicated: Secondary | ICD-10-CM | POA: Diagnosis not present

## 2016-04-18 DIAGNOSIS — E1165 Type 2 diabetes mellitus with hyperglycemia: Secondary | ICD-10-CM | POA: Diagnosis not present

## 2016-04-18 DIAGNOSIS — I10 Essential (primary) hypertension: Secondary | ICD-10-CM | POA: Diagnosis not present

## 2016-04-18 DIAGNOSIS — R739 Hyperglycemia, unspecified: Secondary | ICD-10-CM

## 2016-04-18 DIAGNOSIS — Z91040 Latex allergy status: Secondary | ICD-10-CM | POA: Diagnosis not present

## 2016-04-18 LAB — I-STAT CHEM 8, ED
BUN: 11 mg/dL (ref 6–20)
CREATININE: 0.6 mg/dL (ref 0.44–1.00)
Calcium, Ion: 1.17 mmol/L (ref 1.15–1.40)
Chloride: 97 mmol/L — ABNORMAL LOW (ref 101–111)
GLUCOSE: 460 mg/dL — AB (ref 65–99)
HEMATOCRIT: 41 % (ref 36.0–46.0)
HEMOGLOBIN: 13.9 g/dL (ref 12.0–15.0)
POTASSIUM: 4.4 mmol/L (ref 3.5–5.1)
Sodium: 136 mmol/L (ref 135–145)
TCO2: 32 mmol/L (ref 0–100)

## 2016-04-18 MED ORDER — LEVETIRACETAM 500 MG PO TABS
2000.0000 mg | ORAL_TABLET | Freq: Once | ORAL | Status: AC
  Administered 2016-04-18: 2000 mg via ORAL
  Filled 2016-04-18: qty 4

## 2016-04-18 MED ORDER — INSULIN ASPART 100 UNIT/ML ~~LOC~~ SOLN
10.0000 [IU] | Freq: Once | SUBCUTANEOUS | Status: AC
  Administered 2016-04-18: 10 [IU] via INTRAVENOUS
  Filled 2016-04-18: qty 1

## 2016-04-18 MED ORDER — SODIUM CHLORIDE 0.9 % IV BOLUS (SEPSIS)
1000.0000 mL | Freq: Once | INTRAVENOUS | Status: AC
  Administered 2016-04-18: 1000 mL via INTRAVENOUS

## 2016-04-18 NOTE — ED Provider Notes (Signed)
[facility] DEPT Provider Note   CSN: [id number] Arrival date & time: 04/18/16  1927     History   Chief Complaint Chief Complaint  Patient presents with  . Off meds    HPI Tammy Arellano is a 51 y.o. female.  She presents today with a chief complaint of not taking her medicine since Tuesday (04/13/16).  She reports that she has her medications all filled and available to take.  She said she doesn't know why she didn't take them, other than that she just didn't want to.  She is here today with her sister who assists in the history.  Her sister reports that patient normally lives with their mother, however mother is currently not at home.  Sister reports that about every 4 months the patient will take her self off her medications and has previously had a seizure while off her meds.  She has not had any seizure activity recently.  She reports no complaints, no numbness, tingling, headache, shortness of breath or chest pain.  Her sister is mostly concerned that the patient may have a seizure, the patient has no concerns.   In addition to seizure medications patient is supposed to take oral antihyperglycemics.  She reports no increase in urination, increased thirst, or changes in eating. No pain.        Past Medical History:  Diagnosis Date  . Asthma   . Diabetes mellitus   . Dyslipidemia   . Epileptic seizures ([place])   . Hypertension   . Mental retardation   . Obesity   . Pseudoseizures     Patient Active Problem List   Diagnosis Date Noted  . Hypoglycemia 09/18/2014  . Dyslipidemia 09/18/2014  . Overweight (BMI 25.0-29.9) 09/18/2014  . Asthma, chronic 09/18/2014  . GERD (gastroesophageal reflux disease) 09/18/2014  . UTI (urinary tract infection) 09/18/2014  . UTI (lower urinary tract infection) 09/18/2014  . Localization-related symptomatic epilepsy and epileptic syndromes with complex partial seizures, not intractable, without status epilepticus ([place]) 09/13/2014  .  Mild cognitive disorder 09/13/2014  . Type II diabetes mellitus, uncontrolled ([place]) 06/23/2014  . Essential hypertension, benign 06/21/2014  . Hypoglycemic reaction 06/19/2012  . Uncontrolled diabetes mellitus ([place]) 08/25/2011  . DM2 (diabetes mellitus, type 2) ([place]) 08/24/2011  . Seizure disorder ([place]) 08/24/2011  . Tonic clonic seizures ([place]) 08/24/2011  . Pseudoseizures   . Mental retardation     Past Surgical History:  Procedure Laterality Date  . BREAST CYST EXCISION Left ~ 1988  . REDUCTION MAMMAPLASTY Bilateral ~ 1988    OB History    No data available       Home Medications    Prior to Admission medications   Medication Sig Start Date End Date Taking? Authorizing Provider  albuterol (PROVENTIL HFA;VENTOLIN HFA) 108 (90 BASE) MCG/ACT inhaler Inhale 2 puffs into the lungs every 6 (six) hours as needed for wheezing.    Yes Historical Provider, MD  amLODipine (NORVASC) 5 MG tablet Take 5 mg by mouth daily.  09/05/14  Yes Historical Provider, MD  insulin aspart (NOVOLOG FLEXPEN) 100 UNIT/ML FlexPen Inject 0-9 Units into the skin 3 (three) times daily with meals. Sliding scale Patient taking differently: Inject 5-6 Units into the skin 2 (two) times daily. Sliding scale 09/16/14  Yes [name], MD  insulin NPH Human (HUMULIN N,NOVOLIN N) 100 UNIT/ML injection Inject 45 Units into the skin 2 (two) times daily before a meal.    Yes Historical Provider, MD  irbesartan (AVAPRO) 300 MG  tablet Take 300 mg by mouth daily.   Yes Historical Provider, MD  lamoTRIgine (LAMICTAL) 200 MG tablet Take 1 tablet (200 mg total) by mouth 2 (two) times daily. 01/28/16  Yes Cameron Sprang, MD  levETIRAcetam (KEPPRA) 1000 MG tablet Take one and one half tablets each morning and take two tablets each evening Patient taking differently: Take 1,500-2,000 mg by mouth See admin instructions. Take one and one half tablets each morning and take two tablets each evening 01/28/16  Yes Cameron Sprang, MD    montelukast (SINGULAIR) 10 MG tablet Take 10 mg by mouth at bedtime.   Yes Historical Provider, MD  omeprazole (PRILOSEC) 20 MG capsule Take 20 mg by mouth 2 (two) times daily before a meal.    Yes Historical Provider, MD  pioglitazone-metformin (ACTOPLUS MET) 15-850 MG per tablet Take 1 tablet by mouth daily with breakfast.   Yes Historical Provider, MD  ramipril (ALTACE) 5 MG capsule TAKE 1 CAPSULE BY MOUTH EVERY DAY. Patient taking differently: TAKE 1 CAPSULE BY MOUTH EVERY DAY IN MORNING 10/31/14  Yes [name], MD  VICTOZA 18 MG/3ML SOPN INJECT 0.6MG INTO THE SKIN ONCE DAILY. Patient taking differently: INJECT 1.2MG INTO THE SKIN ONCE DAILY IN EVENING 06/13/15  Yes [name], MD  vitamin E 400 UNIT capsule Take 400 Units by mouth daily.   Yes Historical Provider, MD    Family History Family History  Problem Relation Age of Onset  . Osteoarthritis Mother   . Diabetes Mother   . Hypertension Mother   . Clotting disorder Mother     blood disorder  . Cancer Father     Lung  . Seizures Father   . Heart attack Paternal Grandmother   . Cancer Maternal Grandmother     Social History Social History  Substance Use Topics  . Smoking status: Never Smoker  . Smokeless tobacco: Never Used  . Alcohol use No     Allergies   Depakote [divalproex sodium]; Dilantin [phenytoin sodium extended]; Furosemide; Penicillins; Phenytoin sodium extended; Shellfish allergy; Lip balm [chapstick]; and Latex   Review of Systems Review of Systems  Constitutional: Negative for chills, fatigue and fever.  HENT: Negative for ear pain and sore throat.   Eyes: Negative for pain and visual disturbance.  Respiratory: Negative for cough, chest tightness and shortness of breath.   Cardiovascular: Negative for chest pain and palpitations.  Gastrointestinal: Negative for abdominal distention, abdominal pain and vomiting.  Endocrine: Negative for polydipsia, polyphagia and polyuria.  Genitourinary: Negative  for dysuria and hematuria.  Musculoskeletal: Negative for arthralgias and back pain.  Skin: Negative for color change and rash.  Neurological: Negative for dizziness, seizures, syncope, numbness and headaches.  All other systems reviewed and are negative.    Physical Exam Updated Vital Signs BP 138/87   Pulse 92   Temp 98.9 F (37.2 C) (Oral)   Resp 16   SpO2 100%   Physical Exam  Constitutional: She is oriented to person, place, and time. She appears well-developed and well-nourished. No distress.  HENT:  Head: Normocephalic and atraumatic.  Eyes: Conjunctivae are normal. Pupils are equal, round, and reactive to light.  Neck: Normal range of motion.  Cardiovascular: Normal rate, regular rhythm and normal heart sounds.  Exam reveals no gallop and no friction rub.   No murmur heard. Pulmonary/Chest: Effort normal and breath sounds normal. No stridor. No respiratory distress.  Abdominal: Soft. Bowel sounds are normal. She exhibits no distension. There is no tenderness.  Musculoskeletal: She exhibits no deformity.  Neurological: She is alert and oriented to person, place, and time. She has normal strength. No sensory deficit. She exhibits normal muscle tone. GCS eye subscore is 4. GCS verbal subscore is 5. GCS motor subscore is 6.  Skin: Skin is warm and dry.  Psychiatric: She has a normal mood and affect.  Nursing note and vitals reviewed.    ED Treatments / Results  Labs (all labs ordered are listed, but only abnormal results are displayed) Labs Reviewed  I-STAT CHEM 8, ED - Abnormal; Notable for the following:       Result Value   Chloride 97 (*)    Glucose, Bld 460 (*)    All other components within normal limits  CBG MONITORING, ED - Abnormal; Notable for the following:    Glucose-Capillary 202 (*)    All other components within normal limits    EKG  EKG Interpretation None       Radiology No results found.  Procedures Procedures (including critical care  time)  Medications Ordered in ED Medications  levETIRAcetam (KEPPRA) tablet 2,000 mg (2,000 mg Oral Given 04/18/16 2059)  sodium chloride 0.9 % bolus 1,000 mL (1,000 mLs Intravenous New Bag/Given 04/18/16 2232)  insulin aspart (novoLOG) injection 10 Units (10 Units Intravenous Given 04/18/16 1140)     Initial Impression / Assessment and Plan / ED Course  I have reviewed the triage vital signs and the nursing notes.  Pertinent labs & imaging results that were available during my care of the patient were reviewed by me and considered in my medical decision making (see chart for details).    2040- pharmacy called to discuss normal Keppra home dose of 2,07m vs a possible loading dose.  Pharmacy advised that, due to the high dose, a higher loading dose was not advised. Home dose of 2,0077mkeppra ordered.   CaShelda Altesresented with her sister today as she had not been taking her medications and her sister was concerned she may have a seizure.  Patient apparently decided to stop taking her medications once about every four months despite having them available.  Basic labs were ordered which showed a glucose of 460 mg/dL without an anion gap (7).  This is most likely due to her not taking her oral antihyperglycemics.  After 10 units of IV insulin and a 1 liter fluid bolus her repeat glucose was 202 mg/dL.    Due to medication non compliance, the option of social work or case management was discussed with patient and her sister but they refused assistance stating "she knows how to take her medications, she just needs to do it."   At this time there does not appear to be any evidence of an acute emergency medical condition and the patient appears stable for discharge with appropriate outpatient follow up.Diagnosis was discussed with patient who verbalizes understanding and is agreeable to discharge. Patient has been instructed to take her remaining night time medications upon discharge with  specific instruction NOT to take her home keppra tonight as she received it in the ED.  Pt case discussed with Dr. CoLacinda Axonho agrees with my plan. Patients sister reports she will "keep an eye" on the patient.    Final Clinical Impressions(s) / ED Diagnoses   Final diagnoses:  Hyperglycemia  Non compliance w medication regimen    New Prescriptions New Prescriptions   No medications on file     ElLorin GlassPAUtah3/26/18 [phone]  Nat Christen, MD 04/20/16 1352

## 2016-04-18 NOTE — ED Triage Notes (Addendum)
Pt reports she has not been taking her medications since Tuesday, except her insulin. Primarily she has not taken her seizure meds, BP meds or diabetic meds. Daughter states pt lives by herself and wants pts "levels to be checked." Daughter reports pt has been sleeping more than usual and not acting appropriately but pt does not agree to daughters report. Pt is currently A&OX4. Ambulatory, NAD

## 2016-04-19 DIAGNOSIS — E1165 Type 2 diabetes mellitus with hyperglycemia: Secondary | ICD-10-CM | POA: Diagnosis not present

## 2016-04-19 LAB — CBG MONITORING, ED: GLUCOSE-CAPILLARY: 202 mg/dL — AB (ref 65–99)

## 2016-04-19 NOTE — ED Provider Notes (Signed)
This patient was seen in conjunction with orienting PA Jeraldine LootsHammond. Please see her note for further. Briefly, patient with a history of mental retardation, diabetes and seizures who presents to the emergency department with her sister who reports the patient has not taken her medications in the past 5 days. Patient tells me she is aware she was not taking the medications and just forgets sometimes. Sister reports that she is normally very good about keeping track of her medications. Her mother is out of town, who she usually lives with. I discussed having case management speak to them about having someone come to the home to ensure she is taking her medications, and they declined. Therefore typically the patient has no difficulty taking her medications. I encouraged the mother and sister to ensure that patient is regular taking her medications. Patient has not had any seizures and denies any complaints currently. She is afebrile nontoxic appearing on exam. We'll provide her with her evening dose of Keppra and check blood work for glucose level. Patient's blood sugar is 460 with an anion gap of 7. Will also provide with insulin and fluid bolus. If blood sugar is improving patient can be discharged. I encouraged to take her mediations and to have family follow up on if she is compliant. Sister agrees to plan.   Repeat CBG is 202. Plan for discharge which was completed by PA Jeraldine LootsHammond.   Results for orders placed or performed during the hospital encounter of 04/18/16  I-stat Chem 8, ED  Result Value Ref Range   Sodium 136 135 - 145 mmol/L   Potassium 4.4 3.5 - 5.1 mmol/L   Chloride 97 (L) 101 - 111 mmol/L   BUN 11 6 - 20 mg/dL   Creatinine, Ser 4.030.60 0.44 - 1.00 mg/dL   Glucose, Bld 474460 (H) 65 - 99 mg/dL   Calcium, Ion 2.591.17 5.631.15 - 1.40 mmol/L   TCO2 32 0 - 100 mmol/L   Hemoglobin 13.9 12.0 - 15.0 g/dL   HCT 87.541.0 64.336.0 - 32.946.0 %  CBG monitoring, ED  Result Value Ref Range   Glucose-Capillary 202 (H) 65 - 99  mg/dL      Non compliance w medication regimen  Hyperglycemia     Everlene FarrierWilliam Aubrei Bouchie, PA-C 04/19/16 0056    Donnetta HutchingBrian Cook, MD 04/20/16 1353

## 2016-04-19 NOTE — Discharge Instructions (Signed)
Please take your medications as prescribed.  You were given your normal night time dose of Keppra today.  Upon discharge please take all of your normal nighttime medications EXCEPT your Keppra. Do not take Keppra tonight. Tomorrow morning please take all of your normal medications including your Keppra. Please resume taking all medications.

## 2016-04-22 ENCOUNTER — Emergency Department (HOSPITAL_COMMUNITY)
Admission: EM | Admit: 2016-04-22 | Discharge: 2016-04-22 | Disposition: A | Discharge status: Home / Self Care | Payer: Medicare Other | Attending: Emergency Medicine | Admitting: Emergency Medicine

## 2016-04-22 ENCOUNTER — Encounter (HOSPITAL_COMMUNITY): Payer: Self-pay

## 2016-04-22 DIAGNOSIS — Z0000 Encounter for general adult medical examination without abnormal findings: Secondary | ICD-10-CM | POA: Diagnosis not present

## 2016-04-22 DIAGNOSIS — Z794 Long term (current) use of insulin: Secondary | ICD-10-CM | POA: Diagnosis not present

## 2016-04-22 DIAGNOSIS — E1165 Type 2 diabetes mellitus with hyperglycemia: Secondary | ICD-10-CM | POA: Diagnosis not present

## 2016-04-22 DIAGNOSIS — E119 Type 2 diabetes mellitus without complications: Secondary | ICD-10-CM | POA: Insufficient documentation

## 2016-04-22 DIAGNOSIS — R112 Nausea with vomiting, unspecified: Secondary | ICD-10-CM | POA: Diagnosis not present

## 2016-04-22 DIAGNOSIS — Z8639 Personal history of other endocrine, nutritional and metabolic disease: Secondary | ICD-10-CM

## 2016-04-22 DIAGNOSIS — I10 Essential (primary) hypertension: Secondary | ICD-10-CM | POA: Diagnosis not present

## 2016-04-22 DIAGNOSIS — J45909 Unspecified asthma, uncomplicated: Secondary | ICD-10-CM | POA: Insufficient documentation

## 2016-04-22 LAB — COMPREHENSIVE METABOLIC PANEL
ALK PHOS: 56 U/L (ref 38–126)
ALT: 11 U/L — ABNORMAL LOW (ref 14–54)
ANION GAP: 9 (ref 5–15)
AST: 22 U/L (ref 15–41)
Albumin: 3.8 g/dL (ref 3.5–5.0)
BUN: 7 mg/dL (ref 6–20)
CALCIUM: 9 mg/dL (ref 8.9–10.3)
CHLORIDE: 100 mmol/L — AB (ref 101–111)
CO2: 28 mmol/L (ref 22–32)
Creatinine, Ser: 0.45 mg/dL (ref 0.44–1.00)
GFR calc non Af Amer: >60 mL/min (ref >60)
GLUCOSE: 62 mg/dL — AB (ref 65–99)
Potassium: 3.7 mmol/L (ref 3.5–5.1)
SODIUM: 137 mmol/L (ref 135–145)
Total Bilirubin: 0.6 mg/dL (ref 0.3–1.2)
Total Protein: 7.4 g/dL (ref 6.5–8.1)

## 2016-04-22 LAB — URINALYSIS, ROUTINE W REFLEX MICROSCOPIC
BACTERIA UA: NONE SEEN
Bilirubin Urine: NEGATIVE
GLUCOSE, UA: NEGATIVE mg/dL
Hgb urine dipstick: NEGATIVE
Ketones, ur: NEGATIVE mg/dL
Nitrite: NEGATIVE
PROTEIN: 30 mg/dL — AB
Specific Gravity, Urine: 1.025 (ref 1.005–1.030)
pH: 7 (ref 5.0–8.0)

## 2016-04-22 LAB — CBC
HEMATOCRIT: 39.2 % (ref 36.0–46.0)
HEMOGLOBIN: 12.4 g/dL (ref 12.0–15.0)
MCH: 26.2 pg (ref 26.0–34.0)
MCHC: 31.6 g/dL (ref 30.0–36.0)
MCV: 82.9 fL (ref 78.0–100.0)
Platelets: 172 10*3/uL (ref 150–400)
RBC: 4.73 MIL/uL (ref 3.87–5.11)
RDW: 14 % (ref 11.5–15.5)
WBC: 3.8 10*3/uL — ABNORMAL LOW (ref 4.0–10.5)

## 2016-04-22 LAB — CBG MONITORING, ED
GLUCOSE-CAPILLARY: 59 mg/dL — AB (ref 65–99)
GLUCOSE-CAPILLARY: 66 mg/dL (ref 65–99)
GLUCOSE-CAPILLARY: 97 mg/dL (ref 65–99)

## 2016-04-22 LAB — LIPASE, BLOOD: LIPASE: 11 U/L (ref 11–51)

## 2016-04-22 MED ORDER — ONDANSETRON 4 MG PO TBDP
4.0000 mg | ORAL_TABLET | Freq: Once | ORAL | Status: AC
  Administered 2016-04-22: 4 mg via ORAL
  Filled 2016-04-22: qty 1

## 2016-04-22 NOTE — ED Provider Notes (Addendum)
[facility] DEPT Provider Note   CSN: [id number] Arrival date & time: 04/22/16  8921     History   Chief Complaint Chief Complaint  Patient presents with  . Emesis    HPI Tammy Arellano is a 51 y.o. female.  Patient with hx iddm, presents w family.  Family indicates pt may have not been taking her meds correctly in past few days.  Pt states was nauseated this AM, so had not yet eaten or taken her AM meds. One episode emesis. Prior to day, states eating and drinking normally. No abd pain. +loose bm today, no severe diarrhea. No dysuria. Denies fever or chills. States has all of her normal meds, and is taking as prescribed.    The history is provided by the patient and a relative.  Emesis   Pertinent negatives include no abdominal pain, no chills, no fever and no headaches.    Past Medical History:  Diagnosis Date  . Asthma   . Diabetes mellitus   . Dyslipidemia   . Epileptic seizures ([place])   . Hypertension   . Mental retardation   . Obesity   . Pseudoseizures     Patient Active Problem List   Diagnosis Date Noted  . Hypoglycemia 09/18/2014  . Dyslipidemia 09/18/2014  . Overweight (BMI 25.0-29.9) 09/18/2014  . Asthma, chronic 09/18/2014  . GERD (gastroesophageal reflux disease) 09/18/2014  . UTI (urinary tract infection) 09/18/2014  . UTI (lower urinary tract infection) 09/18/2014  . Localization-related symptomatic epilepsy and epileptic syndromes with complex partial seizures, not intractable, without status epilepticus ([place]) 09/13/2014  . Mild cognitive disorder 09/13/2014  . Type II diabetes mellitus, uncontrolled ([place]) 06/23/2014  . Essential hypertension, benign 06/21/2014  . Hypoglycemic reaction 06/19/2012  . Uncontrolled diabetes mellitus ([place]) 08/25/2011  . DM2 (diabetes mellitus, type 2) ([place]) 08/24/2011  . Seizure disorder ([place]) 08/24/2011  . Tonic clonic seizures ([place]) 08/24/2011  . Pseudoseizures   . Mental retardation     Past Surgical  History:  Procedure Laterality Date  . BREAST CYST EXCISION Left ~ 1988  . REDUCTION MAMMAPLASTY Bilateral ~ 1988    OB History    No data available       Home Medications    Prior to Admission medications   Medication Sig Start Date End Date Taking? Authorizing Provider  albuterol (PROVENTIL HFA;VENTOLIN HFA) 108 (90 BASE) MCG/ACT inhaler Inhale 2 puffs into the lungs every 6 (six) hours as needed for wheezing.     Historical Provider, MD  amLODipine (NORVASC) 5 MG tablet Take 5 mg by mouth daily.  09/05/14   Historical Provider, MD  insulin aspart (NOVOLOG FLEXPEN) 100 UNIT/ML FlexPen Inject 0-9 Units into the skin 3 (three) times daily with meals. Sliding scale Patient taking differently: Inject 5-6 Units into the skin 2 (two) times daily. Sliding scale 09/16/14   [name], MD  insulin NPH Human (HUMULIN N,NOVOLIN N) 100 UNIT/ML injection Inject 45 Units into the skin 2 (two) times daily before a meal.     Historical Provider, MD  irbesartan (AVAPRO) 300 MG tablet Take 300 mg by mouth daily.    Historical Provider, MD  lamoTRIgine (LAMICTAL) 200 MG tablet Take 1 tablet (200 mg total) by mouth 2 (two) times daily. 01/28/16   Cameron Sprang, MD  levETIRAcetam (KEPPRA) 1000 MG tablet Take one and one half tablets each morning and take two tablets each evening Patient taking differently: Take 1,500-2,000 mg by mouth See admin instructions. Take one and one half  tablets each morning and take two tablets each evening 01/28/16   Cameron Sprang, MD  montelukast (SINGULAIR) 10 MG tablet Take 10 mg by mouth at bedtime.    Historical Provider, MD  omeprazole (PRILOSEC) 20 MG capsule Take 20 mg by mouth 2 (two) times daily before a meal.     Historical Provider, MD  pioglitazone-metformin (ACTOPLUS MET) 15-850 MG per tablet Take 1 tablet by mouth daily with breakfast.    Historical Provider, MD  ramipril (ALTACE) 5 MG capsule TAKE 1 CAPSULE BY MOUTH EVERY DAY. Patient taking differently: TAKE 1  CAPSULE BY MOUTH EVERY DAY IN MORNING 10/31/14   [name], MD  VICTOZA 18 MG/3ML SOPN INJECT 0.'6MG'$  INTO THE SKIN ONCE DAILY. Patient taking differently: INJECT 1.'2MG'$  INTO THE SKIN ONCE DAILY IN EVENING 06/13/15   [name], MD  vitamin E 400 UNIT capsule Take 400 Units by mouth daily.    Historical Provider, MD    Family History Family History  Problem Relation Age of Onset  . Osteoarthritis Mother   . Diabetes Mother   . Hypertension Mother   . Clotting disorder Mother     blood disorder  . Cancer Father     Lung  . Seizures Father   . Heart attack Paternal Grandmother   . Cancer Maternal Grandmother     Social History Social History  Substance Use Topics  . Smoking status: Never Smoker  . Smokeless tobacco: Never Used  . Alcohol use No     Allergies   Depakote [divalproex sodium]; Dilantin [phenytoin sodium extended]; Furosemide; Penicillins; Phenytoin sodium extended; Shellfish allergy; Lip balm [chapstick]; and Latex   Review of Systems Review of Systems  Constitutional: Negative for chills and fever.  HENT: Negative for sore throat.   Eyes: Negative for redness.  Respiratory: Negative for shortness of breath.   Cardiovascular: Negative for chest pain.  Gastrointestinal: Positive for nausea and vomiting. Negative for abdominal pain.  Endocrine: Negative for polyuria.  Genitourinary: Negative for dysuria and flank pain.  Musculoskeletal: Negative for back pain and neck pain.  Skin: Negative for rash.  Neurological: Negative for headaches.  Hematological: Does not bruise/bleed easily.  Psychiatric/Behavioral: The patient is not nervous/anxious.      Physical Exam Updated Vital Signs BP 128/85   Pulse 92   Temp 99.5 F (37.5 C) (Oral)   Resp 18   Ht '5\' 3"'$  (1.6 m)   Wt 95.7 kg   SpO2 100%   BMI 37.38 kg/m   Physical Exam  Constitutional: She appears well-developed and well-nourished. No distress.  HENT:  Mouth/Throat: Oropharynx is clear and moist.   Eyes: Conjunctivae are normal. No scleral icterus.  Neck: Neck supple. No tracheal deviation present.  Cardiovascular: Normal rate, regular rhythm, normal heart sounds and intact distal pulses.   Pulmonary/Chest: Effort normal and breath sounds normal. No respiratory distress.  Abdominal: Soft. Normal appearance and bowel sounds are normal. She exhibits no distension. There is no tenderness.  Genitourinary:  Genitourinary Comments: No cva tenderness  Musculoskeletal: She exhibits no edema.  Neurological: She is alert.  Skin: Skin is warm and dry. No rash noted. She is not diaphoretic.  Psychiatric: She has a normal mood and affect.  Nursing note and vitals reviewed.    ED Treatments / Results  Labs (all labs ordered are listed, but only abnormal results are displayed) Results for orders placed or performed during the hospital encounter of 04/22/16  Lipase, blood  Result Value Ref Range  Lipase 11 11 - 51 U/L  Comprehensive metabolic panel  Result Value Ref Range   Sodium 137 135 - 145 mmol/L   Potassium 3.7 3.5 - 5.1 mmol/L   Chloride 100 (L) 101 - 111 mmol/L   CO2 28 22 - 32 mmol/L   Glucose, Bld 62 (L) 65 - 99 mg/dL   BUN 7 6 - 20 mg/dL   Creatinine, Ser 0.45 0.44 - 1.00 mg/dL   Calcium 9.0 8.9 - 10.3 mg/dL   Total Protein 7.4 6.5 - 8.1 g/dL   Albumin 3.8 3.5 - 5.0 g/dL   AST 22 15 - 41 U/L   ALT 11 (L) 14 - 54 U/L   Alkaline Phosphatase 56 38 - 126 U/L   Total Bilirubin 0.6 0.3 - 1.2 mg/dL   GFR calc non Af Amer >60 >60 mL/min   GFR calc Af Amer >60 >60 mL/min   Anion gap 9 5 - 15  CBC  Result Value Ref Range   WBC 3.8 (L) 4.0 - 10.5 K/uL   RBC 4.73 3.87 - 5.11 MIL/uL   Hemoglobin 12.4 12.0 - 15.0 g/dL   HCT 39.2 36.0 - 46.0 %   MCV 82.9 78.0 - 100.0 fL   MCH 26.2 26.0 - 34.0 pg   MCHC 31.6 30.0 - 36.0 g/dL   RDW 14.0 11.5 - 15.5 %   Platelets 172 150 - 400 K/uL  Urinalysis, Routine w reflex microscopic  Result Value Ref Range   Color, Urine YELLOW YELLOW     APPearance CLEAR CLEAR   Specific Gravity, Urine 1.025 1.005 - 1.030   pH 7.0 5.0 - 8.0   Glucose, UA NEGATIVE NEGATIVE mg/dL   Hgb urine dipstick NEGATIVE NEGATIVE   Bilirubin Urine NEGATIVE NEGATIVE   Ketones, ur NEGATIVE NEGATIVE mg/dL   Protein, ur 30 (A) NEGATIVE mg/dL   Nitrite NEGATIVE NEGATIVE   Leukocytes, UA SMALL (A) NEGATIVE   RBC / HPF 0-5 0 - 5 RBC/hpf   WBC, UA 6-30 0 - 5 WBC/hpf   Bacteria, UA NONE SEEN NONE SEEN   Squamous Epithelial / LPF 0-5 (A) NONE SEEN   Mucous PRESENT   CBG monitoring, ED  Result Value Ref Range   Glucose-Capillary 59 (L) 65 - 99 mg/dL  CBG monitoring, ED  Result Value Ref Range   Glucose-Capillary 66 65 - 99 mg/dL  CBG monitoring, ED  Result Value Ref Range   Glucose-Capillary 97 65 - 99 mg/dL   EKG  EKG Interpretation None       Radiology No results found.  Procedures Procedures (including critical care time)  Medications Ordered in ED Medications  ondansetron (ZOFRAN-ODT) disintegrating tablet 4 mg (4 mg Oral Given 04/22/16 0802)     Initial Impression / Assessment and Plan / ED Course  I have reviewed the triage vital signs and the nursing notes.  Pertinent labs & imaging results that were available during my care of the patient were reviewed by me and considered in my medical decision making (see chart for details).  Po fluids.  Tolerates well.  Repeat cbg 97.  Pt given meal tray.  Pt has her meds organized in labelled cases/dispenser, appears well organized.   Pt/family request she take her AM meds - nurse to give pt her normal meds.  Is tolerating po, no recurrent nv. No abd pain.   Pt is well appearing, no vomiting on additional recheck, abd remains soft nt.   Patient appears stable for d/c.  Final Clinical Impressions(s) / ED Diagnoses   Final diagnoses:  None    New Prescriptions New Prescriptions   No medications on file         Lajean Saver, MD 04/22/16 [phone]

## 2016-04-22 NOTE — ED Triage Notes (Signed)
Per Pt, Pt is coming from home with complaints of nausea and vomiting that started this morning at 0600. Pt reports taking Humalog 5 units after a blood sugar of 89 this morning. Before she could eat, pt started to vomit and have diarrhea. Reports a burning abdominal pain. Hx of DM.

## 2016-04-22 NOTE — ED Notes (Signed)
Pt given 2 apple juices, denies nausea after Zofran

## 2016-04-22 NOTE — Discharge Instructions (Signed)
It was our pleasure to provide your ER care today - we hope that you feel better.  Rest. Drink plenty of fluids.  Be very careful to take your medications as prescribed.  Check your blood sugars regularly.    If you begin to feel your blood sugar is low, eat or drink something immediately, and check sugar.  Follow up with primary care doctor in the coming week.  Return to ER if worse, new symptoms, fevers, worsening or severe abdominal pain, persistent vomiting, other concern.

## 2016-04-26 DIAGNOSIS — E1165 Type 2 diabetes mellitus with hyperglycemia: Secondary | ICD-10-CM | POA: Diagnosis not present

## 2016-05-03 ENCOUNTER — Other Ambulatory Visit: Payer: Self-pay | Admitting: Neurology

## 2016-05-03 DIAGNOSIS — G40209 Localization-related (focal) (partial) symptomatic epilepsy and epileptic syndromes with complex partial seizures, not intractable, without status epilepticus: Secondary | ICD-10-CM

## 2016-05-04 DIAGNOSIS — E1165 Type 2 diabetes mellitus with hyperglycemia: Secondary | ICD-10-CM | POA: Diagnosis not present

## 2016-05-05 ENCOUNTER — Other Ambulatory Visit: Payer: Self-pay | Admitting: Internal Medicine

## 2016-05-05 DIAGNOSIS — Z1231 Encounter for screening mammogram for malignant neoplasm of breast: Secondary | ICD-10-CM

## 2016-05-13 DIAGNOSIS — I10 Essential (primary) hypertension: Secondary | ICD-10-CM | POA: Diagnosis not present

## 2016-05-13 DIAGNOSIS — E1165 Type 2 diabetes mellitus with hyperglycemia: Secondary | ICD-10-CM | POA: Diagnosis not present

## 2016-06-10 ENCOUNTER — Ambulatory Visit
Admission: RE | Admit: 2016-06-10 | Discharge: 2016-06-10 | Disposition: A | Discharge status: Home / Self Care | Payer: Medicare Other | Source: Ambulatory Visit | Attending: Internal Medicine | Admitting: Internal Medicine

## 2016-06-10 DIAGNOSIS — Z1231 Encounter for screening mammogram for malignant neoplasm of breast: Secondary | ICD-10-CM | POA: Diagnosis not present

## 2016-06-11 DIAGNOSIS — Z0000 Encounter for general adult medical examination without abnormal findings: Secondary | ICD-10-CM | POA: Diagnosis not present

## 2016-07-29 DIAGNOSIS — E1165 Type 2 diabetes mellitus with hyperglycemia: Secondary | ICD-10-CM | POA: Diagnosis not present

## 2016-07-29 DIAGNOSIS — I10 Essential (primary) hypertension: Secondary | ICD-10-CM | POA: Diagnosis not present

## 2016-07-30 DIAGNOSIS — I10 Essential (primary) hypertension: Secondary | ICD-10-CM | POA: Diagnosis not present

## 2016-08-03 DIAGNOSIS — E1129 Type 2 diabetes mellitus with other diabetic kidney complication: Secondary | ICD-10-CM | POA: Diagnosis not present

## 2016-08-03 DIAGNOSIS — D649 Anemia, unspecified: Secondary | ICD-10-CM | POA: Diagnosis not present

## 2016-08-03 DIAGNOSIS — I10 Essential (primary) hypertension: Secondary | ICD-10-CM | POA: Diagnosis not present

## 2016-09-06 DIAGNOSIS — E1165 Type 2 diabetes mellitus with hyperglycemia: Secondary | ICD-10-CM | POA: Diagnosis not present

## 2016-09-06 DIAGNOSIS — J45909 Unspecified asthma, uncomplicated: Secondary | ICD-10-CM | POA: Diagnosis not present

## 2016-09-06 DIAGNOSIS — I10 Essential (primary) hypertension: Secondary | ICD-10-CM | POA: Diagnosis not present

## 2016-10-13 DIAGNOSIS — E1165 Type 2 diabetes mellitus with hyperglycemia: Secondary | ICD-10-CM | POA: Diagnosis not present

## 2016-10-13 DIAGNOSIS — I10 Essential (primary) hypertension: Secondary | ICD-10-CM | POA: Diagnosis not present

## 2016-10-13 DIAGNOSIS — J45909 Unspecified asthma, uncomplicated: Secondary | ICD-10-CM | POA: Diagnosis not present

## 2016-10-22 DIAGNOSIS — Z23 Encounter for immunization: Secondary | ICD-10-CM | POA: Diagnosis not present

## 2016-10-22 DIAGNOSIS — E1165 Type 2 diabetes mellitus with hyperglycemia: Secondary | ICD-10-CM | POA: Diagnosis not present

## 2016-11-15 DIAGNOSIS — H524 Presbyopia: Secondary | ICD-10-CM | POA: Diagnosis not present

## 2016-11-15 DIAGNOSIS — E119 Type 2 diabetes mellitus without complications: Secondary | ICD-10-CM | POA: Diagnosis not present

## 2016-11-15 DIAGNOSIS — H2513 Age-related nuclear cataract, bilateral: Secondary | ICD-10-CM | POA: Diagnosis not present

## 2016-11-15 DIAGNOSIS — H5213 Myopia, bilateral: Secondary | ICD-10-CM | POA: Diagnosis not present

## 2016-12-03 ENCOUNTER — Other Ambulatory Visit: Payer: Self-pay | Admitting: Neurology

## 2016-12-03 DIAGNOSIS — G40209 Localization-related (focal) (partial) symptomatic epilepsy and epileptic syndromes with complex partial seizures, not intractable, without status epilepticus: Secondary | ICD-10-CM

## 2017-01-27 ENCOUNTER — Ambulatory Visit: Payer: Medicare Other | Admitting: Neurology

## 2017-01-31 ENCOUNTER — Encounter: Payer: Self-pay | Admitting: Neurology

## 2017-01-31 ENCOUNTER — Ambulatory Visit (INDEPENDENT_AMBULATORY_CARE_PROVIDER_SITE_OTHER): Payer: Medicare Other | Admitting: Neurology

## 2017-01-31 VITALS — BP 120/80 | HR 92 | Ht 63.0 in | Wt 198.2 lb

## 2017-01-31 DIAGNOSIS — G40209 Localization-related (focal) (partial) symptomatic epilepsy and epileptic syndromes with complex partial seizures, not intractable, without status epilepticus: Secondary | ICD-10-CM

## 2017-01-31 DIAGNOSIS — E1165 Type 2 diabetes mellitus with hyperglycemia: Secondary | ICD-10-CM | POA: Diagnosis not present

## 2017-01-31 DIAGNOSIS — I10 Essential (primary) hypertension: Secondary | ICD-10-CM | POA: Diagnosis not present

## 2017-01-31 MED ORDER — LEVETIRACETAM 1000 MG PO TABS: ORAL_TABLET | ORAL | 3 refills | Status: AC

## 2017-01-31 MED ORDER — LAMOTRIGINE 200 MG PO TABS: 200.0000 mg | ORAL_TABLET | Freq: Two times a day (BID) | ORAL | 3 refills | Status: DC

## 2017-01-31 NOTE — Progress Notes (Signed)
NEUROLOGY FOLLOW UP OFFICE NOTE  Tammy Arellano [id number]  DOB: 1965-11-10  HISTORY OF PRESENT ILLNESS: I had the pleasure of seeing Tammy Arellano in follow-up in the neurology clinic on 01/31/2017.  The patient was last seen a year ago for recurrent seizures and is again accompanied by her mother who helps supplement the history today.  Since her last visit, she continues to report doing well with no seizures. Last seizure on record was August 2016, at that time her blood sugar was 37. She is taking Keppra '1500mg'$  in AM, '2000mg'$  in PM and Lamotrigine '200mg'$  BID, and denies any side effects. She is in charge of her own medications that come in a pill pack. Her mother denies any staring/unresponsive episodes. She denies any headaches, dizziness, diplopia, focal numbness/tingling/weakness. She has been having a cough and reports medication changes were made due to potential side effect of cough.  HPI: This is a pleasant 52 yo LH woman with a history of diabetes, hypertension, hyperlipidemia, and seizures since age 47. Her mother reports she was found at a bus stop with the first seizure. She has had seizures without prior warning, other times she would feel tired, dizzy, and confused, then people would tell her she was shaking and unresponsive. She has had nocturnal seizures as well, where her mother would hear her making funny gurgling sounds. Her mother has witnessed that a week prior to having a convulsion, she would repeat things, have a blank stare. Last convulsion was 07/01/14, her mother heard a sound in the utility room and found her on the floor. Two weeks ago, she had a seizure at church where her sister reported she had a "goofy look," unresponsive, with no convulsive activity. She was amnestic of this. She denies any post-ictal focal weakness, usually she feels tired and drunk ("I can't walk straight"). Her longest seizure-free interval was 2 years. On review of prior records on [facility], she was on  Depakote which caused weight gain and low white count. Her mother reports she had swelling with Dilantin. She has been on Keppra for many years, and switched from Depakote to Lamictal for at least 6 years. She has some problems with medication compliance, with seizures occurring when she misses medications, however she reports having breakthrough seizures even when taking medication regularly.   She carries a diagnosis of pseudoseizures as well. On review of records, in 2009, she was reported to have a pseudoseizure during EEG setup, witnessed by EEG technician. She was described as complaining of head hurting with room spinning and dizziness, during the episode of body shaking and flapping at her side, she bit her tongue, lasting less than 5 minutes. She was immediately conversing after the event. EEG was within normal limits. There is an EEG report by Dr. Erling Cruz from 2007 with abnormal photic response with high-amplitude sharp and slow wave activity noticed synchronously in the temporal and occipital regions. No abnormality seen outside of photic stimulation. No MRI studies available for review. I personally reviewed head CT without contrast done 08/25/2014 which was unremarkable.  She was born with forceps delivery and had ?scalp hematoma. She had delay with motor and speech milestones and was in special education classes, diagnosed with a learning disability. She is unemployed and does not drive. She lives with her mother. She denies any olfactory/gustatory hallucinations, deja vu, rising epigastric sensation, focal numbness/tingling/weakness, myoclonic jerks. Her mother, however, reports body jerks.   Epilepsy Risk Factors: Her maternal grandfather had seizures, her father started  having seizures in his 73s. She was delivered via forceps delivery and has mild cognitive impairment and delay with milestones. Otherwise there is no history of febrile convulsions, CNS infections such as meningitis/encephalitis,  significant traumatic brain injury, neurosurgical procedures  Prior AEDs: Dilantin, Depakote  Diagnostic Data: her 1-hour awake and asleep EEG done 02/10/2015 was normal. She has not done MRI brain. Head CT in 07/2014 was unremarkable.  PAST MEDICAL HISTORY: Past Medical History:  Diagnosis Date  . Asthma   . Diabetes mellitus   . Dyslipidemia   . Epileptic seizures ([place])   . Hypertension   . Mental retardation   . Obesity   . Pseudoseizures     MEDICATIONS: Current Outpatient Medications on File Prior to Visit  Medication Sig Dispense Refill  . albuterol (PROVENTIL HFA;VENTOLIN HFA) 108 (90 BASE) MCG/ACT inhaler Inhale 2 puffs into the lungs every 6 (six) hours as needed for wheezing.     Marland Kitchen amLODipine (NORVASC) 5 MG tablet Take 5 mg by mouth daily.     . insulin aspart (NOVOLOG FLEXPEN) 100 UNIT/ML FlexPen Inject 0-9 Units into the skin 3 (three) times daily with meals. Sliding scale (Patient taking differently: Inject 5-6 Units into the skin 2 (two) times daily. Sliding scale) 15 mL 1  . insulin NPH Human (HUMULIN N,NOVOLIN N) 100 UNIT/ML injection Inject 45 Units into the skin 2 (two) times daily before a meal.     . irbesartan (AVAPRO) 300 MG tablet Take 300 mg by mouth daily.    Marland Kitchen LAMICTAL 200 MG tablet TAKE 1 TABLET BY MOUTH TWICE DAILY 60 tablet 0  . levETIRAcetam (KEPPRA) 1000 MG tablet Take one and one half tablets each morning and take two tablets each evening (Patient taking differently: Take 1,500-2,000 mg by mouth See admin instructions. Take one and one half tablets each morning and take two tablets each evening) 315 tablet 3  . montelukast (SINGULAIR) 10 MG tablet Take 10 mg by mouth at bedtime.    Marland Kitchen omeprazole (PRILOSEC) 20 MG capsule Take 20 mg by mouth 2 (two) times daily before a meal.     . pioglitazone-metformin (ACTOPLUS MET) 15-850 MG per tablet Take 1 tablet by mouth daily with breakfast.    . ramipril (ALTACE) 5 MG capsule TAKE 1 CAPSULE BY MOUTH EVERY DAY.  (Patient taking differently: TAKE 1 CAPSULE BY MOUTH EVERY DAY IN MORNING) 30 capsule 3  . VICTOZA 18 MG/3ML SOPN INJECT 0.'6MG'$  INTO THE SKIN ONCE DAILY. (Patient taking differently: INJECT 1.'2MG'$  INTO THE SKIN ONCE DAILY IN EVENING) 3 mL 0  . vitamin E 400 UNIT capsule Take 400 Units by mouth daily.     No current facility-administered medications on file prior to visit.     ALLERGIES: Allergies  Allergen Reactions  . Depakote [Divalproex Sodium] Other (See Comments)    Leukopenia and weight gain  . Dilantin [Phenytoin Sodium Extended] Anaphylaxis    Induces seizures   . Furosemide Anaphylaxis  . Penicillins Nausea And Vomiting and Other (See Comments)    seizures  . Phenytoin Sodium Extended Anaphylaxis    Induces seizures   . Shellfish Allergy Anaphylaxis  . Lip Balm [Chapstick] Other (See Comments)    Lips swell only chapstick brand  . Latex Rash    FAMILY HISTORY: Family History  Problem Relation Age of Onset  . Osteoarthritis Mother   . Diabetes Mother   . Hypertension Mother   . Clotting disorder Mother        blood disorder  .  Breast cancer Mother   . Cancer Father        Lung  . Seizures Father   . Heart attack Paternal Grandmother   . Cancer Maternal Grandmother   . Breast cancer Maternal Grandmother     SOCIAL HISTORY: Social History   Socioeconomic History  . Marital status: Single    Spouse name: Not on file  . Number of children: 0  . Years of education: 49  . Highest education level: Not on file  Social Needs  . Financial resource strain: Not on file  . Food insecurity - worry: Not on file  . Food insecurity - inability: Not on file  . Transportation needs - medical: Not on file  . Transportation needs - non-medical: Not on file  Occupational History  . Not on file  Tobacco Use  . Smoking status: Never Smoker  . Smokeless tobacco: Never Used  Substance and Sexual Activity  . Alcohol use: No  . Drug use: No  . Sexual activity: No  Other  Topics Concern  . Not on file  Social History Narrative   Patient is single with no children and resides with her mother.   Patient is left handed       REVIEW OF SYSTEMS: Constitutional: No fevers, chills, or sweats, no generalized fatigue, change in appetite Eyes: No visual changes, double vision, eye pain Ear, nose and throat: No hearing loss, ear pain, nasal congestion, sore throat Cardiovascular: No chest pain, palpitations Respiratory:  No shortness of breath at rest or with exertion, wheezes GastrointestinaI: No nausea, vomiting, diarrhea, abdominal pain, fecal incontinence Genitourinary:  No dysuria, urinary retention or frequency Musculoskeletal:  No neck pain, back pain Integumentary: No rash, pruritus, skin lesions Neurological: as above Psychiatric: No depression, insomnia, anxiety Endocrine: No palpitations, fatigue, diaphoresis, mood swings, change in appetite, change in weight, increased thirst Hematologic/Lymphatic:  No anemia, purpura, petechiae. Allergic/Immunologic: no itchy/runny eyes, nasal congestion, recent allergic reactions, rashes  PHYSICAL EXAM: Vitals:   01/31/17 1547  BP: 120/80  Pulse: 92  SpO2: 96%   General: No acute distress Head:  Normocephalic/atraumatic Neck: supple, no paraspinal tenderness, full range of motion Heart:  Regular rate and rhythm Lungs:  Clear to auscultation bilaterally Back: No paraspinal tenderness Skin/Extremities: No rash, no edema Neurological Exam: alert and oriented to person, place, and time. No aphasia or dysarthria. Fund of knowledge is appropriate.  Recent and remote memory are impaired.  Attention and concentration are normal.    Able to name objects and repeat phrases. Cranial nerves: Pupils equal, round, reactive to light.  Extraocular movements intact with no nystagmus. Visual fields full. Facial sensation intact. No facial asymmetry. Tongue, uvula, palate midline.  Motor: Bulk and tone normal, muscle strength  5/5 throughout with no pronator drift.  Sensation to light touch intact.  No extinction to double simultaneous stimulation.  Deep tendon reflexes 2+ throughout, toes downgoing.  Finger to nose testing intact.  Gait wide-based, no ataxia, able to tandem walk adequately (similar to prior).  Romberg negative.  IMPRESSION: This is a pleasant 52 yo LH woman with a history of diabetes, hypertension, hyperlipidemia, mild cognitive impairment, and seizures since age 21. By semiology, family has witnessed her with blank stare, unresponsiveness for several minutes, which may or may not progress to a convulsion, suggestive of focal to bilateral tonic-clonic epilepsy. A prior EEG in 2007 reported high-amplitude sharp and slow wave activity noticed synchronously in the temporal and occipital regions. There is also concern for non-epileptic  events. She may have co-existing epilepsy and non-epileptic events, but denies any seizures since August 2016. At that time, she was hypoglycemic down to the 30s and 40s. Continue Keppra '1500mg'$  in AM, '2000mg'$  in PM and Lamictal '200mg'$  BID. Refills sent today. She does not drive. She will follow-up in 1 year and knows to call our office for any changes.   Thank you for allowing me to participate in her care.  Please do not hesitate to call for any questions or concerns.  The duration of this appointment visit was 15 minutes of face-to-face time with the patient.  Greater than 50% of this time was spent in counseling, explanation of diagnosis, planning of further management, and coordination of care.   Ellouise Newer, M.D.   CC: Dr. Maudie Mercury

## 2017-01-31 NOTE — Patient Instructions (Signed)
Great seeing you! Continue all your medications. Follow-up in 1 year, call for any changes.  Seizure Precautions: 1. If medication has been prescribed for you to prevent seizures, take it exactly as directed.  Do not stop taking the medicine without talking to your doctor first, even if you have not had a seizure in a long time.   2. Avoid activities in which a seizure would cause danger to yourself or to others.  Don't operate dangerous machinery, swim alone, or climb in high or dangerous places, such as on ladders, roofs, or girders.  Do not drive unless your doctor says you may.  3. If you have any warning that you may have a seizure, lay down in a safe place where you can't hurt yourself.    4.  No driving for 6 months from last seizure, as per [place] state law.   Please refer to the following link on the Epilepsy Foundation of America's website for more information: http://www.epilepsyfoundation.org/answerplace/Social/driving/drivingu.cfm   5.  Maintain good sleep hygiene. Avoid alcohol.  6.  Contact your doctor if you have any problems that may be related to the medicine you are taking.  7.  Call 911 and bring the patient back to the ED if:        A.  The seizure lasts longer than 5 minutes.       B.  The patient doesn't awaken shortly after the seizure  C.  The patient has new problems such as difficulty seeing, speaking or moving  D.  The patient was injured during the seizure  E.  The patient has a temperature over 102 F (39C)  F.  The patient vomited and now is having trouble breathing         

## 2017-02-03 ENCOUNTER — Encounter: Payer: Self-pay | Admitting: Neurology

## 2017-02-04 ENCOUNTER — Telehealth: Payer: Self-pay | Admitting: Neurology

## 2017-02-04 NOTE — Telephone Encounter (Signed)
Tammy FanningJulie called regarding Lamictal 200 MG. They are needing some information for Prior Auth that was incomplete. Ref# [id number]. Please Call to expedite. Thanks

## 2017-02-18 IMAGING — CT CT HEAD W/O CM
2 series · 16 of 30 positions shown, 20 images · non-contrast
Comparison: 08/24/2011

CLINICAL DATA: Fatigue, sleepiness. History of seizures.
Developmentally delayed.

EXAM:
CT HEAD WITHOUT CONTRAST
TECHNIQUE: Contiguous axial images were obtained from the base of the skull
through the vertex without intravenous contrast.

[Series 201: head w/o, idose (1) · axial · non-contrast · 0.43mm/px · z∈[+1053,+1188]mm · 13 of 33 slices shown, 17 images]
[im 3/33  brain]
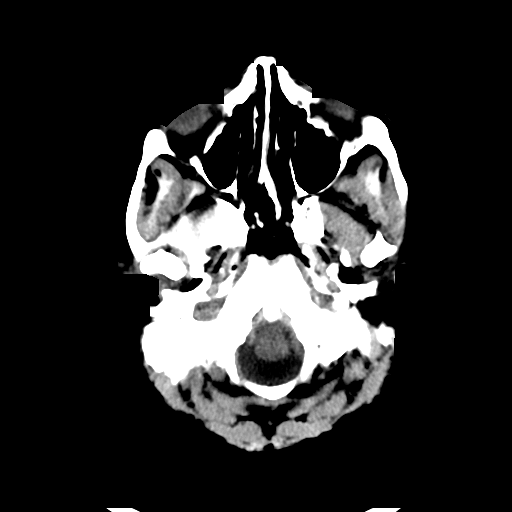
[im 3/33  bone]
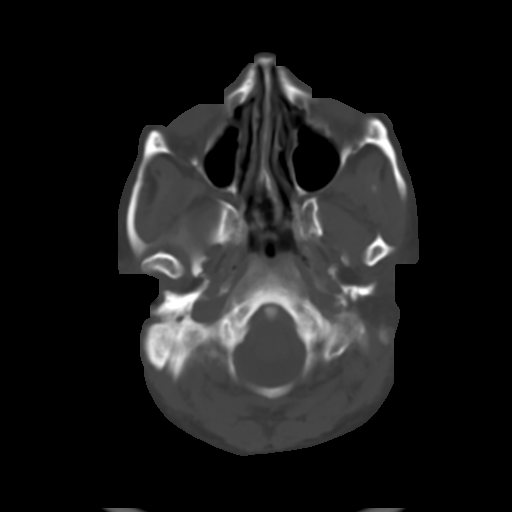
[im 5/33  brain]
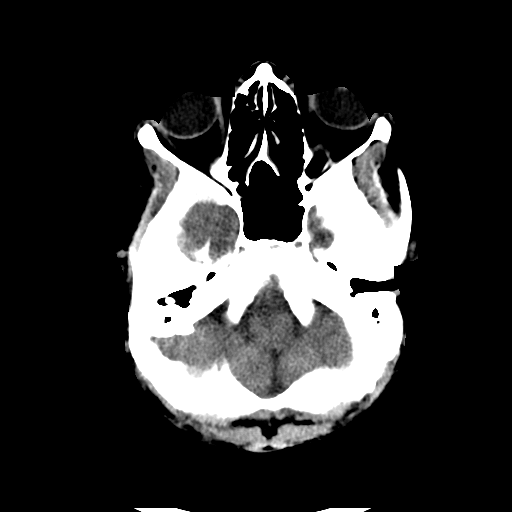
[im 7/33  brain]
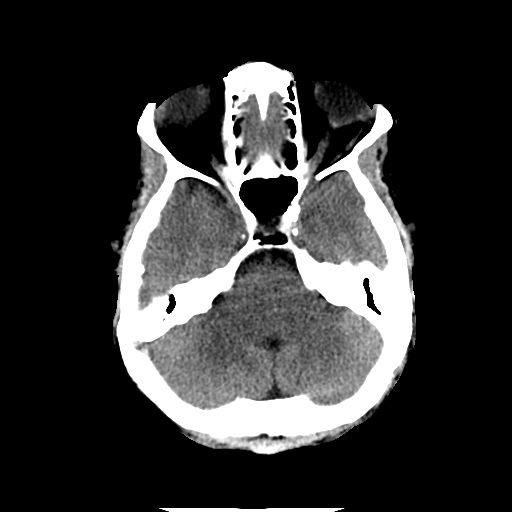
[im 10/33  brain]
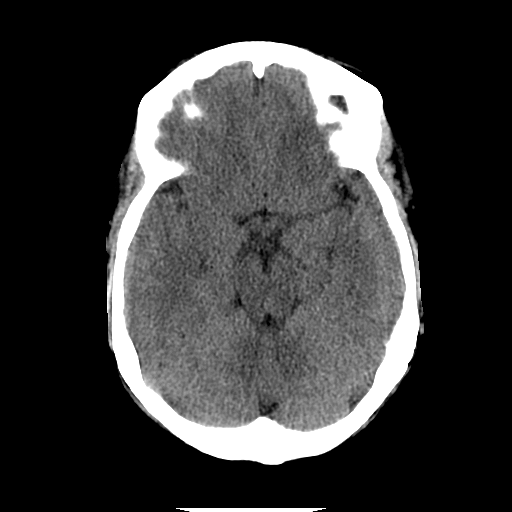
[im 12/33  brain]
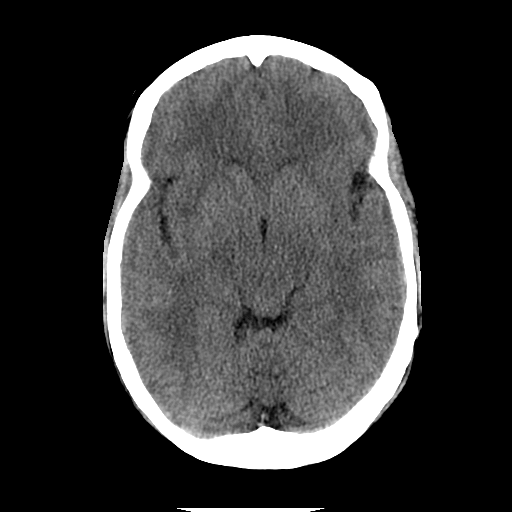
[im 12/33  bone]
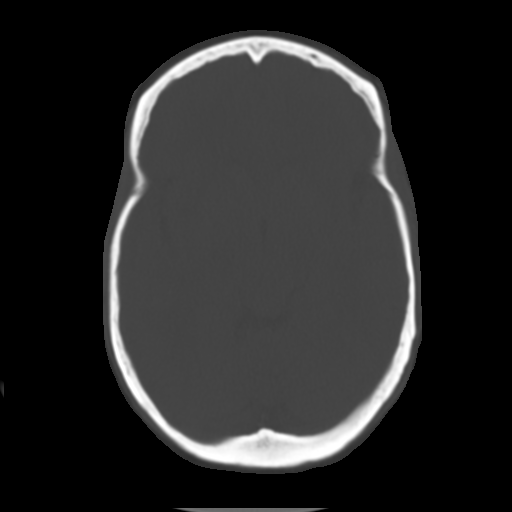
[im 14/33  brain]
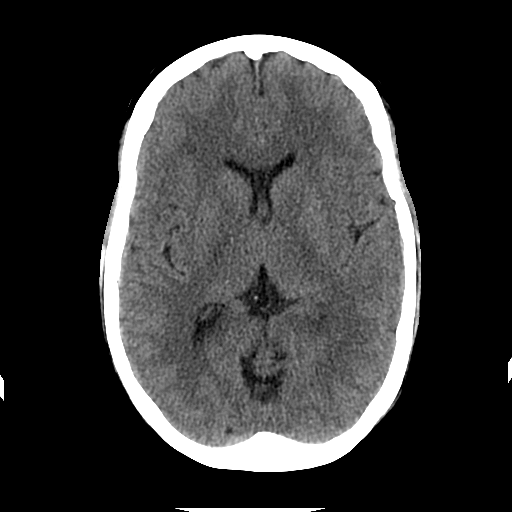
[im 17/33  brain]
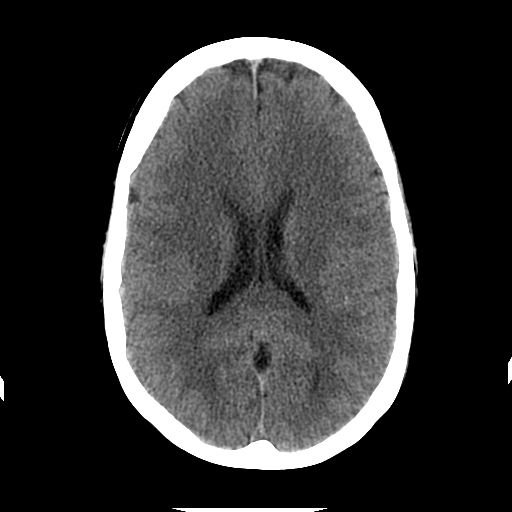
[im 19/33  brain]
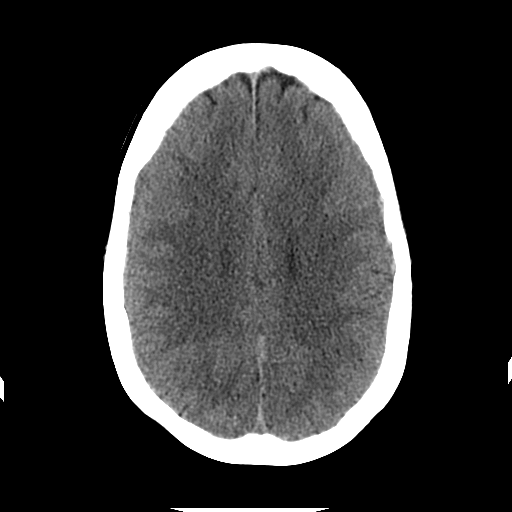
[im 21/33  brain]
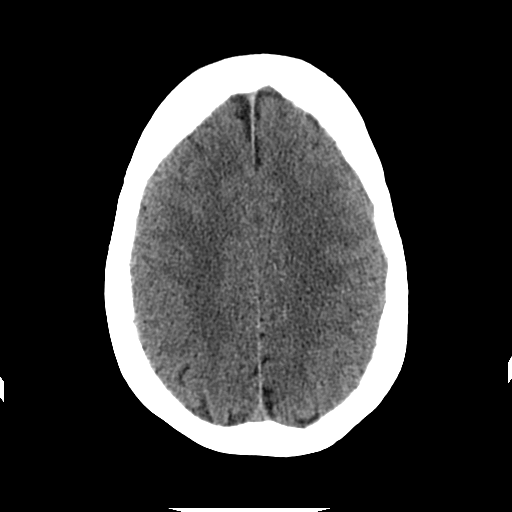
[im 21/33  bone]
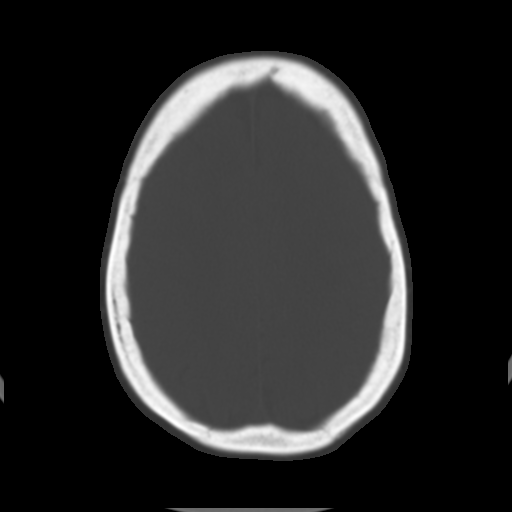
[im 23/33  brain]
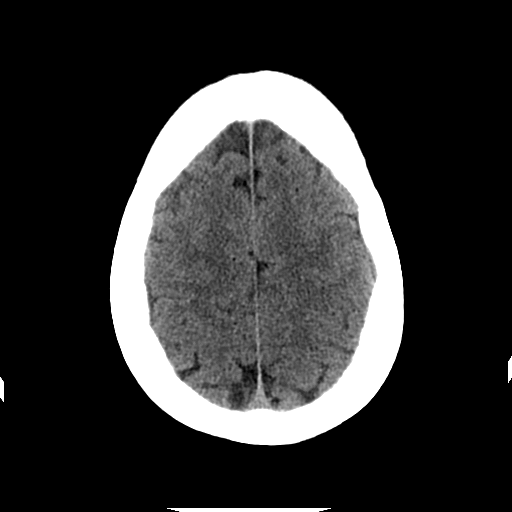
[im 26/33  brain]
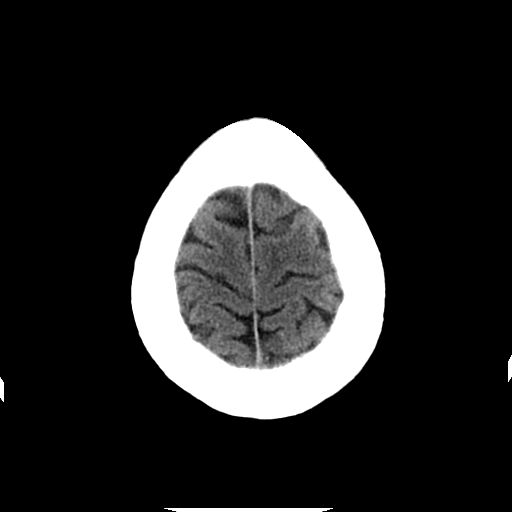
[im 28/33  brain]
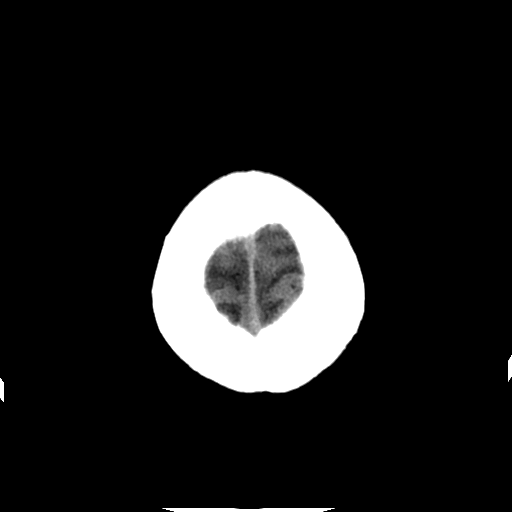
[im 30/33  brain]
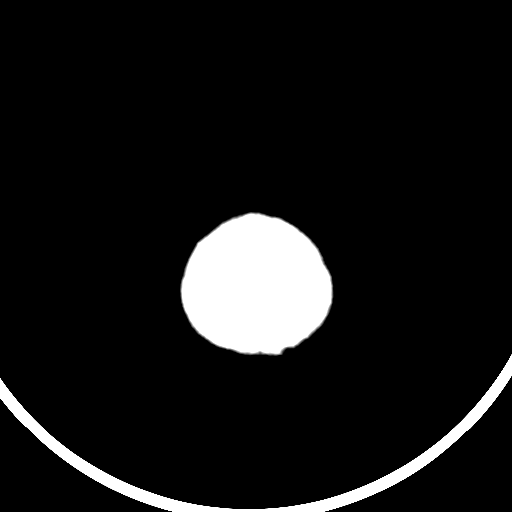
[im 30/33  bone]
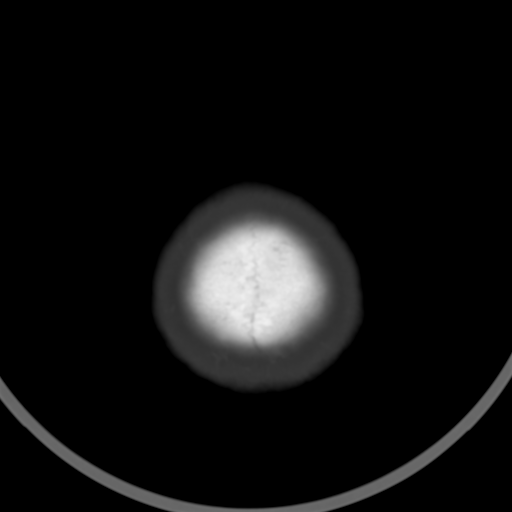

[Series 202: head w/o bone, idose (1) · axial · non-contrast · 0.43mm/px · z∈[+1053,+1098]mm · 3 of 33 slices shown]
[im 3/33  bone]
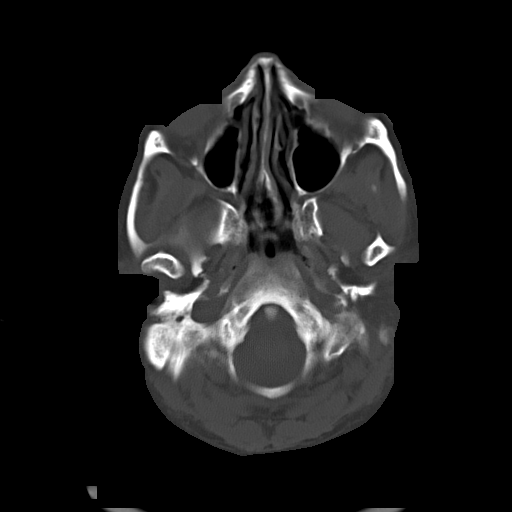
[im 7/33  bone]
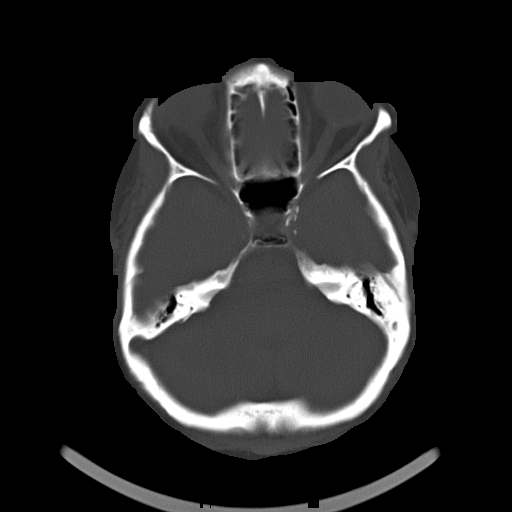
[im 12/33  bone]
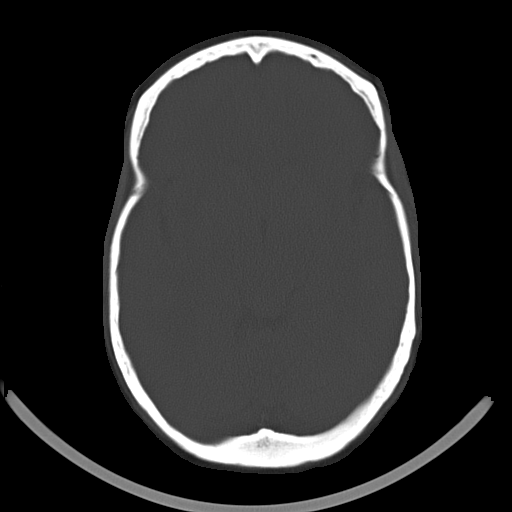

[16 of 30 positions shown; findings below may reference images not displayed]

FINDINGS: Ventricles, cisterns and other CSF spaces are normal. There is no
mass, mass effect, shift of midline structures or acute hemorrhage.
There is no evidence of acute infarction. Remaining bones and soft
tissues are within normal.
IMPRESSION: No acute intracranial findings.

## 2017-05-11 ENCOUNTER — Encounter (HOSPITAL_COMMUNITY): Payer: Self-pay | Admitting: Internal Medicine

## 2017-05-11 ENCOUNTER — Emergency Department (HOSPITAL_COMMUNITY)
Admission: EM | Admit: 2017-05-11 | Discharge: 2017-05-11 | Disposition: A | Discharge status: Home / Self Care | Payer: Medicare Other | Attending: Emergency Medicine | Admitting: Emergency Medicine

## 2017-05-11 DIAGNOSIS — J45909 Unspecified asthma, uncomplicated: Secondary | ICD-10-CM | POA: Diagnosis not present

## 2017-05-11 DIAGNOSIS — R569 Unspecified convulsions: Secondary | ICD-10-CM | POA: Diagnosis not present

## 2017-05-11 DIAGNOSIS — G40909 Epilepsy, unspecified, not intractable, without status epilepticus: Secondary | ICD-10-CM | POA: Insufficient documentation

## 2017-05-11 DIAGNOSIS — I10 Essential (primary) hypertension: Secondary | ICD-10-CM | POA: Diagnosis not present

## 2017-05-11 DIAGNOSIS — E119 Type 2 diabetes mellitus without complications: Secondary | ICD-10-CM | POA: Diagnosis not present

## 2017-05-11 DIAGNOSIS — Z794 Long term (current) use of insulin: Secondary | ICD-10-CM | POA: Insufficient documentation

## 2017-05-11 DIAGNOSIS — Z79899 Other long term (current) drug therapy: Secondary | ICD-10-CM | POA: Diagnosis not present

## 2017-05-11 DIAGNOSIS — Z91040 Latex allergy status: Secondary | ICD-10-CM | POA: Diagnosis not present

## 2017-05-11 NOTE — ED Provider Notes (Signed)
[facility]-EMERGENCY DEPT Provider Note   CSN: [id number] Arrival date & time: 05/11/17  1247     History   Chief Complaint Chief Complaint  Patient presents with  . Seizures    HPI Tammy Arellano is a 52 y.o. female.  HPI Patient is a 53 year old female with a history of epilepsy who presents the emergency department after acute seizure activity today.  She states is been several months since she had her last seizure.  She was in a car with a family member.  She began having loss consciousness and diffuse shaking.  This lasted several minutes.  She had a postictal period.  She was postictal for EMS.  He feels better at this time.  She states the lacks thing she knew she was in the back of the ambulance.  She is been in her normal state of health over the past several weeks.  She is been compliant with her Keppra.   Primary neurologist: Dr. Karel Jarvis  Personally reviewed the patient's last neurology note in the electronic medical record     Past Medical History:  Diagnosis Date  . Asthma   . Diabetes mellitus   . Dyslipidemia   . Epileptic seizures (H[facility])   . Hypertension   . Mental retardation   . Obesity   . Pseudoseizures     Patient Active Problem List   Diagnosis Date Noted  . Hypoglycemia 09/18/2014  . Dyslipidemia 09/18/2014  . Overweight (BMI 25.0-29.9) 09/18/2014  . Asthma, chronic 09/18/2014  . GERD (gastroesophageal reflux disease) 09/18/2014  . UTI (urinary tract infection) 09/18/2014  . UTI (lower urinary tract infection) 09/18/2014  . Localization-related symptomatic epilepsy and epileptic syndromes with complex partial seizures, not intractable, without status epilepticus (HCC) 09/13/2014  . Mild cognitive disorder 09/13/2014  . Type II diabetes mellitus, uncontrolled (HCC) 06/23/2014  . Essential hypertension, benign 06/21/2014  . Hypoglycemic reaction 06/19/2012  . Uncontrolled diabetes mellitus (HCC) 08/25/2011  . DM2  (diabetes mellitus, type 2) (HCC) 08/24/2011  . Seizure disorder (HCC) 08/24/2011  . Tonic clonic seizures (HCC) 08/24/2011  . Pseudoseizures   . Mental retardation     Past Surgical History:  Procedure Laterality Date  . BREAST CYST EXCISION Left ~ 1988  . REDUCTION MAMMAPLASTY Bilateral ~ 1988     OB History   None      Home Medications    Prior to Admission medications   Medication Sig Start Date End Date Taking? Authorizing Provider  albuterol (PROVENTIL HFA;VENTOLIN HFA) 108 (90 BASE) MCG/ACT inhaler Inhale 2 puffs into the lungs daily.    Yes [provider]  amLODipine (NORVASC) 5 MG tablet Take 5 mg by mouth daily.  09/05/14  Yes [provider]  hydrALAZINE (APRESOLINE) 25 MG tablet Take 25 mg by mouth 2 (two) times daily. 05/05/17  Yes [provider]  insulin NPH Human (HUMULIN N,NOVOLIN N) 100 UNIT/ML injection Inject 45 Units into the skin daily before breakfast.    Yes [provider]  irbesartan (AVAPRO) 300 MG tablet Take 300 mg by mouth daily.   Yes [provider]  levETIRAcetam (KEPPRA) 1000 MG tablet Take one and one half tablets each morning and take two tablets each evening 01/31/17  Yes Van Clines, MD  montelukast (SINGULAIR) 10 MG tablet Take 10 mg by mouth at bedtime.   Yes [provider]  omeprazole (PRILOSEC) 20 MG capsule Take 20 mg by mouth 2 (two) times daily before a meal.  Yes [provider]  pioglitazone (ACTOS) 15 MG tablet Take 15 mg by mouth daily. 05/05/17  Yes [provider]  pravastatin (PRAVACHOL) 10 MG tablet Take 5 mg by mouth daily. 05/05/17  Yes [provider]  ramipril (ALTACE) 5 MG capsule TAKE 1 CAPSULE BY MOUTH EVERY DAY. Patient taking differently: TAKE 1 CAPSULE BY MOUTH EVERY DAY IN MORNING 10/31/14  Yes Reather LittlerKumar, Ajay, MD  vitamin E 400 UNIT capsule Take 400 Units by mouth daily.   Yes [provider]  insulin aspart (NOVOLOG FLEXPEN) 100  UNIT/ML FlexPen Inject 0-9 Units into the skin 3 (three) times daily with meals. Sliding scale Patient not taking: Reported on 05/11/2017 09/16/14   Reather LittlerKumar, Ajay, MD  lamoTRIgine (LAMICTAL) 200 MG tablet Take 1 tablet (200 mg total) by mouth 2 (two) times daily. Patient not taking: Reported on 05/11/2017 01/31/17   Van ClinesAquino, Karen M, MD  VICTOZA 18 MG/3ML SOPN INJECT 0.6MG  INTO THE SKIN ONCE DAILY. Patient not taking: Reported on 05/11/2017 06/13/15   Reather LittlerKumar, Ajay, MD    Family History Family History  Problem Relation Age of Onset  . Osteoarthritis Mother   . Diabetes Mother   . Hypertension Mother   . Clotting disorder Mother        blood disorder  . Breast cancer Mother   . Cancer Father        Lung  . Seizures Father   . Heart attack Paternal Grandmother   . Cancer Maternal Grandmother   . Breast cancer Maternal Grandmother     Social History Social History   Tobacco Use  . Smoking status: Never Smoker  . Smokeless tobacco: Never Used  Substance Use Topics  . Alcohol use: No  . Drug use: No     Allergies   Depakote [divalproex sodium]; Dilantin [phenytoin sodium extended]; Furosemide; Penicillins; Phenytoin sodium extended; Shellfish allergy; Lip balm [chapstick]; and Latex   Review of Systems Review of Systems  All other systems reviewed and are negative.    Physical Exam Updated Vital Signs BP 133/83   Pulse 93   Resp 16   SpO2 100%   Physical Exam  Constitutional: She is oriented to person, place, and time. She appears well-developed and well-nourished. No distress.  HENT:  Head: Normocephalic and atraumatic.  Eyes: Pupils are equal, round, and reactive to light. EOM are normal.  Neck: Normal range of motion.  Cardiovascular: Normal rate, regular rhythm and normal heart sounds.  Pulmonary/Chest: Effort normal and breath sounds normal.  Abdominal: Soft. She exhibits no distension. There is no tenderness.  Musculoskeletal: Normal range of motion.    Neurological: She is alert and oriented to person, place, and time.  5/5 strength in major muscle groups of  bilateral upper and lower extremities. Speech normal. No facial asymetry.   Skin: Skin is warm and dry.  Psychiatric: She has a normal mood and affect. Judgment normal.  Nursing note and vitals reviewed.    ED Treatments / Results  Labs (all labs ordered are listed, but only abnormal results are displayed) Labs Reviewed - No data to display  EKG None  Radiology No results found.  Procedures Procedures (including critical care time)  Medications Ordered in ED Medications - No data to display   Initial Impression / Assessment and Plan / ED Course  I have reviewed the triage vital signs and the nursing notes.  Pertinent labs & imaging results that were available during my care of the patient were reviewed by me  and considered in my medical decision making (see chart for details).     Breakthrough's seizure in the setting of known epilepsy.  Compliant with her medications.  No indication for medication adjustment here in the emergency department.  Vital signs are stable.  Patient is return to baseline mental status.  Her neurologic exam is normal.  She can follow-up safely with her neurologist.  She understands to return to the ER for new or worsening symptoms.  Prior neurology records reviewed  Nursing notes reviewed  Vital signs stable.  Final Clinical Impressions(s) / ED Diagnoses   Final diagnoses:  Seizure [facility])  Seizure disorder [facility])    ED Discharge Orders    None       Azalia Bilis, MD 05/11/17 1513

## 2017-05-11 NOTE — ED Triage Notes (Addendum)
Pt arrived via [facility] after having a seizure that lasted approx 3 minutes in her car. Per EMS, patient was in a post ictal state when they arrived on scene. She is able to answer questions appropriately for this RN. States "I don't know what happened I just woke up when the ambulance got there." Pt is A/Ox4.

## 2017-05-11 NOTE — ED Notes (Signed)
Pt assisted with the bedpan and voided estimated 400cc of urine.

## 2017-05-30 ENCOUNTER — Other Ambulatory Visit: Payer: Self-pay | Admitting: Internal Medicine

## 2017-05-30 DIAGNOSIS — Z1231 Encounter for screening mammogram for malignant neoplasm of breast: Secondary | ICD-10-CM

## 2017-07-15 ENCOUNTER — Ambulatory Visit
Admission: RE | Admit: 2017-07-15 | Discharge: 2017-07-15 | Disposition: A | Discharge status: Home / Self Care | Payer: Medicare Other | Source: Ambulatory Visit | Attending: Internal Medicine | Admitting: Internal Medicine

## 2017-07-15 ENCOUNTER — Ambulatory Visit: Payer: Medicare Other

## 2017-07-15 DIAGNOSIS — Z1231 Encounter for screening mammogram for malignant neoplasm of breast: Secondary | ICD-10-CM

## 2017-08-30 DIAGNOSIS — I10 Essential (primary) hypertension: Secondary | ICD-10-CM | POA: Diagnosis not present

## 2017-08-30 DIAGNOSIS — J45909 Unspecified asthma, uncomplicated: Secondary | ICD-10-CM | POA: Diagnosis not present

## 2017-08-30 DIAGNOSIS — R05 Cough: Secondary | ICD-10-CM | POA: Diagnosis not present

## 2017-08-30 DIAGNOSIS — E785 Hyperlipidemia, unspecified: Secondary | ICD-10-CM | POA: Diagnosis not present

## 2017-09-05 ENCOUNTER — Encounter: Payer: Self-pay | Admitting: Neurology

## 2017-09-05 ENCOUNTER — Telehealth: Payer: Self-pay | Admitting: Neurology

## 2017-09-05 ENCOUNTER — Institutional Professional Consult (permissible substitution): Payer: Medicare Other | Admitting: Neurology

## 2017-09-05 NOTE — Telephone Encounter (Signed)
Pt was no show to apt today 

## 2017-09-28 DIAGNOSIS — J45901 Unspecified asthma with (acute) exacerbation: Secondary | ICD-10-CM | POA: Diagnosis not present

## 2017-09-28 DIAGNOSIS — Z23 Encounter for immunization: Secondary | ICD-10-CM | POA: Diagnosis not present

## 2017-09-28 DIAGNOSIS — J45909 Unspecified asthma, uncomplicated: Secondary | ICD-10-CM | POA: Diagnosis not present

## 2017-10-05 DIAGNOSIS — J45909 Unspecified asthma, uncomplicated: Secondary | ICD-10-CM | POA: Diagnosis not present

## 2017-10-10 DIAGNOSIS — J45909 Unspecified asthma, uncomplicated: Secondary | ICD-10-CM | POA: Diagnosis not present

## 2017-10-17 DIAGNOSIS — J45909 Unspecified asthma, uncomplicated: Secondary | ICD-10-CM | POA: Diagnosis not present

## 2017-10-24 DIAGNOSIS — I10 Essential (primary) hypertension: Secondary | ICD-10-CM | POA: Diagnosis not present

## 2017-10-24 DIAGNOSIS — E119 Type 2 diabetes mellitus without complications: Secondary | ICD-10-CM | POA: Diagnosis not present

## 2017-10-24 DIAGNOSIS — J45909 Unspecified asthma, uncomplicated: Secondary | ICD-10-CM | POA: Diagnosis not present

## 2017-10-31 DIAGNOSIS — I10 Essential (primary) hypertension: Secondary | ICD-10-CM | POA: Diagnosis not present

## 2017-10-31 DIAGNOSIS — E1165 Type 2 diabetes mellitus with hyperglycemia: Secondary | ICD-10-CM | POA: Diagnosis not present

## 2017-10-31 DIAGNOSIS — J45998 Other asthma: Secondary | ICD-10-CM | POA: Diagnosis not present

## 2017-10-31 DIAGNOSIS — J45909 Unspecified asthma, uncomplicated: Secondary | ICD-10-CM | POA: Diagnosis not present

## 2017-11-01 ENCOUNTER — Institutional Professional Consult (permissible substitution): Payer: Medicare Other | Admitting: Neurology

## 2017-11-01 ENCOUNTER — Telehealth: Payer: Self-pay | Admitting: Neurology

## 2017-11-01 NOTE — Telephone Encounter (Signed)
dismiss

## 2017-11-01 NOTE — Telephone Encounter (Signed)
Patient had 2nd no show today as new patient. Dismiss?

## 2017-11-02 ENCOUNTER — Telehealth: Payer: Self-pay | Admitting: Neurology

## 2017-11-02 NOTE — Telephone Encounter (Signed)
She was dismissed yesterday

## 2017-11-02 NOTE — Telephone Encounter (Signed)
FYI- Patient has no-showed to 2 new patient appts in 2019.

## 2017-11-03 ENCOUNTER — Encounter: Payer: Self-pay | Admitting: Neurology

## 2017-11-03 DIAGNOSIS — Z794 Long term (current) use of insulin: Secondary | ICD-10-CM | POA: Diagnosis not present

## 2017-11-03 DIAGNOSIS — E1165 Type 2 diabetes mellitus with hyperglycemia: Secondary | ICD-10-CM | POA: Diagnosis not present

## 2017-11-07 DIAGNOSIS — J45909 Unspecified asthma, uncomplicated: Secondary | ICD-10-CM | POA: Diagnosis not present

## 2017-11-09 DIAGNOSIS — J45909 Unspecified asthma, uncomplicated: Secondary | ICD-10-CM | POA: Diagnosis not present

## 2017-11-16 DIAGNOSIS — J45909 Unspecified asthma, uncomplicated: Secondary | ICD-10-CM | POA: Diagnosis not present

## 2017-11-17 DIAGNOSIS — H2513 Age-related nuclear cataract, bilateral: Secondary | ICD-10-CM | POA: Diagnosis not present

## 2017-11-17 DIAGNOSIS — E119 Type 2 diabetes mellitus without complications: Secondary | ICD-10-CM | POA: Diagnosis not present

## 2017-11-17 DIAGNOSIS — H04123 Dry eye syndrome of bilateral lacrimal glands: Secondary | ICD-10-CM | POA: Diagnosis not present

## 2017-11-17 DIAGNOSIS — Z794 Long term (current) use of insulin: Secondary | ICD-10-CM | POA: Diagnosis not present

## 2017-11-22 DIAGNOSIS — H5213 Myopia, bilateral: Secondary | ICD-10-CM | POA: Diagnosis not present

## 2017-11-22 DIAGNOSIS — H524 Presbyopia: Secondary | ICD-10-CM | POA: Diagnosis not present

## 2017-11-28 DIAGNOSIS — J45909 Unspecified asthma, uncomplicated: Secondary | ICD-10-CM | POA: Diagnosis not present

## 2017-11-29 ENCOUNTER — Other Ambulatory Visit: Payer: Self-pay | Admitting: Neurology

## 2017-11-29 DIAGNOSIS — G40209 Localization-related (focal) (partial) symptomatic epilepsy and epileptic syndromes with complex partial seizures, not intractable, without status epilepticus: Secondary | ICD-10-CM

## 2017-12-13 DIAGNOSIS — J45909 Unspecified asthma, uncomplicated: Secondary | ICD-10-CM | POA: Diagnosis not present

## 2017-12-14 DIAGNOSIS — E1165 Type 2 diabetes mellitus with hyperglycemia: Secondary | ICD-10-CM | POA: Diagnosis not present

## 2017-12-14 DIAGNOSIS — J45909 Unspecified asthma, uncomplicated: Secondary | ICD-10-CM | POA: Diagnosis not present

## 2017-12-26 ENCOUNTER — Institutional Professional Consult (permissible substitution): Payer: Medicare Other | Admitting: Neurology

## 2018-01-11 DIAGNOSIS — J45909 Unspecified asthma, uncomplicated: Secondary | ICD-10-CM | POA: Diagnosis not present

## 2018-01-20 ENCOUNTER — Emergency Department (HOSPITAL_COMMUNITY)
Admission: EM | Admit: 2018-01-20 | Discharge: 2018-01-20 | Disposition: A | Discharge status: Home / Self Care | Payer: Medicare Other | Attending: Emergency Medicine | Admitting: Emergency Medicine

## 2018-01-20 ENCOUNTER — Other Ambulatory Visit: Payer: Self-pay

## 2018-01-20 DIAGNOSIS — Z79899 Other long term (current) drug therapy: Secondary | ICD-10-CM | POA: Diagnosis not present

## 2018-01-20 DIAGNOSIS — R569 Unspecified convulsions: Secondary | ICD-10-CM | POA: Diagnosis not present

## 2018-01-20 DIAGNOSIS — J45909 Unspecified asthma, uncomplicated: Secondary | ICD-10-CM | POA: Diagnosis not present

## 2018-01-20 DIAGNOSIS — Z91040 Latex allergy status: Secondary | ICD-10-CM | POA: Diagnosis not present

## 2018-01-20 DIAGNOSIS — Z9114 Patient's other noncompliance with medication regimen: Secondary | ICD-10-CM

## 2018-01-20 DIAGNOSIS — E119 Type 2 diabetes mellitus without complications: Secondary | ICD-10-CM | POA: Diagnosis not present

## 2018-01-20 DIAGNOSIS — Z91148 Patient's other noncompliance with medication regimen for other reason: Secondary | ICD-10-CM

## 2018-01-20 DIAGNOSIS — I10 Essential (primary) hypertension: Secondary | ICD-10-CM | POA: Diagnosis not present

## 2018-01-20 DIAGNOSIS — R0902 Hypoxemia: Secondary | ICD-10-CM | POA: Diagnosis not present

## 2018-01-20 DIAGNOSIS — W19XXXA Unspecified fall, initial encounter: Secondary | ICD-10-CM | POA: Diagnosis not present

## 2018-01-20 DIAGNOSIS — Z794 Long term (current) use of insulin: Secondary | ICD-10-CM | POA: Diagnosis not present

## 2018-01-20 DIAGNOSIS — S0990XA Unspecified injury of head, initial encounter: Secondary | ICD-10-CM | POA: Diagnosis not present

## 2018-01-20 LAB — CBG MONITORING, ED: Glucose-Capillary: 160 mg/dL — ABNORMAL HIGH (ref 70–99)

## 2018-01-20 MED ORDER — LEVETIRACETAM 500 MG PO TABS
1500.0000 mg | ORAL_TABLET | Freq: Once | ORAL | Status: AC
  Administered 2018-01-20: 1500 mg via ORAL
  Filled 2018-01-20: qty 3

## 2018-01-20 NOTE — ED Notes (Signed)
Bed: WA03 Expected date:  Expected time:  Means of arrival:  Comments: EMS[facility] 

## 2018-01-20 NOTE — ED Provider Notes (Signed)
[facility]-EMERGENCY DEPT Provider Note   CSN: [id number] Arrival date & time: 01/20/18  [id number]     History   Chief Complaint Chief Complaint  Patient presents with  . Seizures    HPI Tammy Arellano is a 52 y.o. female.  HPI 52 year old female with a known history of epilepsy presents the emergency department with reported seizure from home.  Patient is on Keppra and Lamictal for both seizures and nonepileptic seizure-like activity.  History of breakthrough seizures.  Otherwise no recent illness.  Family unavailable at this time for additional information regarding the seizure activity.  Patient has no complaints at this time.  She denies weakness of her arms or legs.  She denies headache.  She states she is hungry.  Additional information will be obtained from family when they arrive.   Past Medical History:  Diagnosis Date  . Asthma   . Diabetes mellitus   . Dyslipidemia   . Epileptic seizures (HCC)   . Hypertension   . Mental retardation   . Obesity   . Pseudoseizures     Patient Active Problem List   Diagnosis Date Noted  . Hypoglycemia 09/18/2014  . Dyslipidemia 09/18/2014  . Overweight (BMI 25.0-29.9) 09/18/2014  . Asthma, chronic 09/18/2014  . GERD (gastroesophageal reflux disease) 09/18/2014  . UTI (urinary tract infection) 09/18/2014  . UTI (lower urinary tract infection) 09/18/2014  . Mild cognitive disorder 09/13/2014  . Type II diabetes mellitus, uncontrolled (HCC) 06/23/2014  . Essential hypertension, benign 06/21/2014  . Hypoglycemic reaction 06/19/2012  . Uncontrolled diabetes mellitus (HCC) 08/25/2011  . DM2 (diabetes mellitus, type 2) (HCC) 08/24/2011  . Seizure disorder (HCC) 08/24/2011  . Tonic clonic seizures (HCC) 08/24/2011  . Pseudoseizures   . Mental retardation     Past Surgical History:  Procedure Laterality Date  . BREAST CYST EXCISION Left ~ 1988  . REDUCTION MAMMAPLASTY Bilateral ~ 1988     OB History     No obstetric history on file.      Home Medications    Prior to Admission medications   Medication Sig Start Date End Date Taking? Authorizing Provider  albuterol (PROVENTIL HFA;VENTOLIN HFA) 108 (90 BASE) MCG/ACT inhaler Inhale 2 puffs into the lungs daily.     [provider]  amLODipine (NORVASC) 5 MG tablet Take 5 mg by mouth daily.  09/05/14   [provider]  hydrALAZINE (APRESOLINE) 25 MG tablet Take 25 mg by mouth 2 (two) times daily. 05/05/17   [provider]  insulin aspart (NOVOLOG FLEXPEN) 100 UNIT/ML FlexPen Inject 0-9 Units into the skin 3 (three) times daily with meals. Sliding scale Patient not taking: Reported on 05/11/2017 09/16/14   Reather LittlerKumar, Ajay, MD  insulin NPH Human (HUMULIN N,NOVOLIN N) 100 UNIT/ML injection Inject 45 Units into the skin daily before breakfast.     [provider]  irbesartan (AVAPRO) 300 MG tablet Take 300 mg by mouth daily.    [provider]  LAMICTAL 200 MG tablet TAKE 1 TABLET BY MOUTH TWICE DAILY 11/29/17   Van ClinesAquino, Karen M, MD  levETIRAcetam (KEPPRA) 1000 MG tablet Take one and one half tablets each morning and take two tablets each evening 01/31/17   Van ClinesAquino, Karen M, MD  montelukast (SINGULAIR) 10 MG tablet Take 10 mg by mouth at bedtime.    [provider]  omeprazole (PRILOSEC) 20 MG capsule Take 20 mg by mouth 2 (two) times daily before a meal.     [provider]  pioglitazone (ACTOS) 15 MG tablet Take 15 mg by mouth daily. 05/05/17   [provider]  pravastatin (PRAVACHOL) 10 MG tablet Take 5 mg by mouth daily. 05/05/17   [provider]  ramipril (ALTACE) 5 MG capsule TAKE 1 CAPSULE BY MOUTH EVERY DAY. Patient taking differently: TAKE 1 CAPSULE BY MOUTH EVERY DAY IN [facility] 10/31/14   Reather Littler, MD  VICTOZA 18 MG/3ML SOPN INJECT 0.6MG  INTO THE SKIN ONCE DAILY. Patient not taking: Reported on 05/11/2017 06/13/15   Reather Littler, MD  vitamin E 400 UNIT capsule Take 400  Units by mouth daily.    [provider]    Family History Family History  Problem Relation Age of Onset  . Osteoarthritis Mother   . Diabetes Mother   . Hypertension Mother   . Clotting disorder Mother        blood disorder  . Breast cancer Mother   . Cancer Father        Lung  . Seizures Father   . Heart attack Paternal Grandmother   . Cancer Maternal Grandmother   . Breast cancer Maternal Grandmother     Social History Social History   Tobacco Use  . Smoking status: Never Smoker  . Smokeless tobacco: Never Used  Substance Use Topics  . Alcohol use: No  . Drug use: No     Allergies   Depakote [divalproex sodium]; Dilantin [phenytoin sodium extended]; Furosemide; Penicillins; Phenytoin sodium extended; Shellfish allergy; Lip balm [chapstick]; and Latex   Review of Systems Review of Systems  All other systems reviewed and are negative.    Physical Exam Updated Vital Signs BP 127/72   Pulse 93   SpO2 98%   Physical Exam Vitals signs and nursing note reviewed.  Constitutional:      General: She is not in acute distress.    Appearance: She is well-developed.  HENT:     Head: Normocephalic and atraumatic.  Eyes:     Pupils: Pupils are equal, round, and reactive to light.  Neck:     Musculoskeletal: Normal range of motion.  Cardiovascular:     Rate and Rhythm: Normal rate and regular rhythm.     Heart sounds: Normal heart sounds.  Pulmonary:     Effort: Pulmonary effort is normal.     Breath sounds: Normal breath sounds.  Abdominal:     General: There is no distension.     Palpations: Abdomen is soft.     Tenderness: There is no abdominal tenderness.  Musculoskeletal: Normal range of motion.  Skin:    General: Skin is warm and dry.  Neurological:     Mental Status: She is alert and oriented to person, place, and time.     Comments: 5/5 strength in major muscle groups of  bilateral upper and lower extremities. Speech normal. No facial  asymetry.   Psychiatric:        Judgment: Judgment normal.      ED Treatments / Results  Labs (all labs ordered are listed, but only abnormal results are displayed) Labs Reviewed  CBG MONITORING, ED - Abnormal; Notable for the following components:      Result Value   Glucose-Capillary 160 (*)    All other components within normal limits    EKG None  Radiology No results found.  Procedures Procedures (including critical care time)  Medications Ordered in ED Medications - No data to display   Initial Impression / Assessment and Plan / ED Course  I have  reviewed the triage vital signs and the nursing notes.  Pertinent labs & imaging results that were available during my care of the patient were reviewed by me and considered in my medical decision making (see chart for details).     10:43 AM Patient continues to feel good at this time.  No complaints.  No indication for additional work-up at this time.  Return to baseline mental status.  I discussed the case with the patient's mother who reports that she just reviewed the patient's home seizure medicines and it looks as though she is missed her last 6 evening doses of her Keppra.  She has not had her morning Keppra.  She will be given her morning dose today.  Her mother will help her with her evening doses.  Outpatient neurology follow-up.  No indication for labs or advanced imaging.  Normal neurologic exam.  Baseline status.  Family to take patient home.  Final Clinical Impressions(s) / ED Diagnoses   Final diagnoses:  Seizure (HCC)  Hx of medication noncompliance    ED Discharge Orders    None       Azalia Bilisampos, Leslee Suire, MD 01/20/18 1044

## 2018-01-20 NOTE — ED Triage Notes (Signed)
Per EMS: Pt is coming from home with a complaint of a seizure. Hx of seizures. Pt reports the seizure lasted for 15 seconds and she fell and hit her head on the trash can. Pt last took her keppra yesterday morning and did not take her dose last night or this morning.  Pt denies neck or back pain.

## 2018-01-20 NOTE — Discharge Instructions (Addendum)
Please take your morning and evening doses of the seizure medicine prescribed  Call the neurologist for follow up

## 2018-01-23 DIAGNOSIS — J45909 Unspecified asthma, uncomplicated: Secondary | ICD-10-CM | POA: Diagnosis not present

## 2018-01-31 ENCOUNTER — Ambulatory Visit: Payer: Medicare Other | Admitting: Neurology

## 2018-01-31 ENCOUNTER — Other Ambulatory Visit: Payer: Self-pay | Admitting: Neurology

## 2018-01-31 ENCOUNTER — Other Ambulatory Visit: Payer: Self-pay

## 2018-01-31 DIAGNOSIS — E1129 Type 2 diabetes mellitus with other diabetic kidney complication: Secondary | ICD-10-CM | POA: Diagnosis not present

## 2018-01-31 DIAGNOSIS — G40209 Localization-related (focal) (partial) symptomatic epilepsy and epileptic syndromes with complex partial seizures, not intractable, without status epilepticus: Secondary | ICD-10-CM

## 2018-01-31 DIAGNOSIS — J45909 Unspecified asthma, uncomplicated: Secondary | ICD-10-CM | POA: Diagnosis not present

## 2018-01-31 DIAGNOSIS — E1140 Type 2 diabetes mellitus with diabetic neuropathy, unspecified: Secondary | ICD-10-CM | POA: Diagnosis not present

## 2018-02-02 ENCOUNTER — Other Ambulatory Visit: Payer: Self-pay | Admitting: Neurology

## 2018-02-02 DIAGNOSIS — G40209 Localization-related (focal) (partial) symptomatic epilepsy and epileptic syndromes with complex partial seizures, not intractable, without status epilepticus: Secondary | ICD-10-CM

## 2018-02-13 DIAGNOSIS — J45909 Unspecified asthma, uncomplicated: Secondary | ICD-10-CM | POA: Diagnosis not present

## 2018-02-22 DIAGNOSIS — J45909 Unspecified asthma, uncomplicated: Secondary | ICD-10-CM | POA: Diagnosis not present

## 2018-02-28 ENCOUNTER — Other Ambulatory Visit: Payer: Self-pay | Admitting: Neurology

## 2018-02-28 DIAGNOSIS — G40209 Localization-related (focal) (partial) symptomatic epilepsy and epileptic syndromes with complex partial seizures, not intractable, without status epilepticus: Secondary | ICD-10-CM

## 2018-03-17 DIAGNOSIS — J45909 Unspecified asthma, uncomplicated: Secondary | ICD-10-CM | POA: Diagnosis not present

## 2018-03-21 DIAGNOSIS — J45909 Unspecified asthma, uncomplicated: Secondary | ICD-10-CM | POA: Diagnosis not present

## 2018-03-28 DIAGNOSIS — R05 Cough: Secondary | ICD-10-CM | POA: Diagnosis not present

## 2018-03-28 DIAGNOSIS — J029 Acute pharyngitis, unspecified: Secondary | ICD-10-CM | POA: Diagnosis not present

## 2018-04-03 ENCOUNTER — Other Ambulatory Visit: Payer: Self-pay | Admitting: Family Medicine

## 2018-04-03 DIAGNOSIS — R059 Cough, unspecified: Secondary | ICD-10-CM

## 2018-04-03 DIAGNOSIS — R05 Cough: Secondary | ICD-10-CM

## 2018-04-18 DIAGNOSIS — J45909 Unspecified asthma, uncomplicated: Secondary | ICD-10-CM | POA: Diagnosis not present

## 2018-06-21 ENCOUNTER — Other Ambulatory Visit: Payer: Self-pay | Admitting: Internal Medicine

## 2018-06-21 DIAGNOSIS — Z1231 Encounter for screening mammogram for malignant neoplasm of breast: Secondary | ICD-10-CM

## 2018-06-28 ENCOUNTER — Other Ambulatory Visit: Payer: Self-pay

## 2018-06-28 ENCOUNTER — Ambulatory Visit
Admission: RE | Admit: 2018-06-28 | Discharge: 2018-06-28 | Disposition: A | Discharge status: Home / Self Care | Payer: Medicare Other | Source: Ambulatory Visit | Attending: Family Medicine | Admitting: Family Medicine

## 2018-06-28 DIAGNOSIS — R05 Cough: Secondary | ICD-10-CM

## 2018-06-28 DIAGNOSIS — R059 Cough, unspecified: Secondary | ICD-10-CM

## 2018-06-28 DIAGNOSIS — J984 Other disorders of lung: Secondary | ICD-10-CM | POA: Diagnosis not present

## 2018-07-27 DIAGNOSIS — R05 Cough: Secondary | ICD-10-CM | POA: Diagnosis not present

## 2018-07-27 DIAGNOSIS — J45909 Unspecified asthma, uncomplicated: Secondary | ICD-10-CM | POA: Diagnosis not present

## 2018-07-27 DIAGNOSIS — E1165 Type 2 diabetes mellitus with hyperglycemia: Secondary | ICD-10-CM | POA: Diagnosis not present

## 2018-07-27 DIAGNOSIS — E118 Type 2 diabetes mellitus with unspecified complications: Secondary | ICD-10-CM | POA: Diagnosis not present

## 2018-07-27 DIAGNOSIS — I10 Essential (primary) hypertension: Secondary | ICD-10-CM | POA: Diagnosis not present

## 2018-08-08 ENCOUNTER — Other Ambulatory Visit: Payer: Self-pay | Admitting: *Deleted

## 2018-08-08 ENCOUNTER — Inpatient Hospital Stay: Admission: RE | Admit: 2018-08-08 | Payer: Medicare Other | Source: Ambulatory Visit

## 2018-08-08 DIAGNOSIS — Z20822 Contact with and (suspected) exposure to covid-19: Secondary | ICD-10-CM

## 2018-08-09 DIAGNOSIS — R6889 Other general symptoms and signs: Secondary | ICD-10-CM | POA: Diagnosis not present

## 2018-08-10 DIAGNOSIS — E1165 Type 2 diabetes mellitus with hyperglycemia: Secondary | ICD-10-CM | POA: Diagnosis not present

## 2018-08-10 DIAGNOSIS — E1140 Type 2 diabetes mellitus with diabetic neuropathy, unspecified: Secondary | ICD-10-CM | POA: Diagnosis not present

## 2018-08-10 DIAGNOSIS — Z0000 Encounter for general adult medical examination without abnormal findings: Secondary | ICD-10-CM | POA: Diagnosis not present

## 2018-08-13 LAB — NOVEL CORONAVIRUS, NAA: SARS-CoV-2, NAA: NOT DETECTED

## 2018-08-21 DIAGNOSIS — H524 Presbyopia: Secondary | ICD-10-CM | POA: Diagnosis not present

## 2018-09-13 ENCOUNTER — Other Ambulatory Visit: Payer: Self-pay

## 2018-09-13 NOTE — Patient Outreach (Signed)
[facility]) Care Management  09/13/2018  Tammy Arellano Jan 23, 1966 [id number]   Medication Adherence call to Tammy Arellano Compliant Voice message left with a call back number. Tammy Arellano is showing past due on Ramipril 5 mg under [place].   [facility] Management Direct Dial [phone]  Fax [phone] [email or web address]

## 2018-10-03 ENCOUNTER — Other Ambulatory Visit: Payer: Self-pay

## 2018-10-03 NOTE — Patient Outreach (Signed)
[facility]) Care Management  10/03/2018  Tammy Arellano 1965-11-06 [id number]   Medication Adherence call to [name] [facility] Identifiers Verify spoke with patient she is past due on Ramipril patient explain she receives a bubble pack every 18 th of each month, patient has medication at this time and is expecting an order on the 88 th. [name] is showing past due under [place].   [facility] Management Direct Dial [phone]  Fax [phone] [name]

## 2018-10-05 DIAGNOSIS — Z23 Encounter for immunization: Secondary | ICD-10-CM | POA: Diagnosis not present

## 2018-11-28 DIAGNOSIS — E1165 Type 2 diabetes mellitus with hyperglycemia: Secondary | ICD-10-CM | POA: Diagnosis not present

## 2018-11-28 DIAGNOSIS — E1140 Type 2 diabetes mellitus with diabetic neuropathy, unspecified: Secondary | ICD-10-CM | POA: Diagnosis not present

## 2018-11-30 DIAGNOSIS — E1140 Type 2 diabetes mellitus with diabetic neuropathy, unspecified: Secondary | ICD-10-CM | POA: Diagnosis not present

## 2018-11-30 DIAGNOSIS — Z0000 Encounter for general adult medical examination without abnormal findings: Secondary | ICD-10-CM | POA: Diagnosis not present

## 2018-11-30 DIAGNOSIS — D649 Anemia, unspecified: Secondary | ICD-10-CM | POA: Diagnosis not present

## 2018-11-30 DIAGNOSIS — Z794 Long term (current) use of insulin: Secondary | ICD-10-CM | POA: Diagnosis not present

## 2019-03-22 DIAGNOSIS — E1140 Type 2 diabetes mellitus with diabetic neuropathy, unspecified: Secondary | ICD-10-CM | POA: Diagnosis not present

## 2019-03-22 DIAGNOSIS — D649 Anemia, unspecified: Secondary | ICD-10-CM | POA: Diagnosis not present

## 2019-04-12 ENCOUNTER — Ambulatory Visit: Payer: Medicare Other | Attending: Family

## 2019-04-12 DIAGNOSIS — Z23 Encounter for immunization: Secondary | ICD-10-CM

## 2019-04-12 NOTE — Progress Notes (Signed)
   Covid-19 Vaccination Clinic  Name:  METTA KORANDA    MRN: [id number] DOB: 02/06/1965  04/12/2019  Ms. Das was observed post Covid-19 immunization for 15 minutes without incident. She was provided with Vaccine Information Sheet and instruction to access the V-Safe system.   Ms. Frayre was instructed to call 911 with any severe reactions post vaccine: Marland Kitchen Difficulty breathing  . Swelling of face and throat  . A fast heartbeat  . A bad rash all over body  . Dizziness and weakness   Immunizations Administered    Name Date Dose VIS Date Route   Moderna COVID-19 Vaccine 04/12/2019 10:51 AM 0.5 mL 12/26/2018 Intramuscular   Manufacturer: Moderna   Lot: [id number]   NDC: 25053-976-73

## 2019-04-12 NOTE — Progress Notes (Signed)
   Covid-19 Vaccination Clinic  Name:  Tammy Arellano    MRN: [id number] DOB: 07/04/1965  04/12/2019  [name] was observed post Covid-19 immunization for 15 minutes without incident. She was provided with Vaccine Information Sheet and instruction to access the V-Safe system.   [name] was instructed to call 911 with any severe reactions post vaccine: . Difficulty breathing  . Swelling of face and throat  . A fast heartbeat  . A bad rash all over body  . Dizziness and weakness   Immunizations Administered    Name Date Dose VIS Date Route   Moderna COVID-19 Vaccine 04/12/2019 10:51 AM 0.5 mL 12/26/2018 Intramuscular   Manufacturer: Moderna   Lot: [id number]   NDC: 80777-273-10     

## 2019-05-15 ENCOUNTER — Ambulatory Visit: Payer: Medicare HMO | Attending: Family

## 2019-05-15 DIAGNOSIS — Z23 Encounter for immunization: Secondary | ICD-10-CM

## 2019-05-15 NOTE — Progress Notes (Signed)
   Covid-19 Vaccination Clinic  Name:  Tammy Arellano    MRN: [id number] DOB: 11-May-1965  05/15/2019  [name] was observed post Covid-19 immunization for 15 minutes without incident. She was provided with Vaccine Information Sheet and instruction to access the V-Safe system.   [name] was instructed to call 911 with any severe reactions post vaccine: Marland Kitchen Difficulty breathing  . Swelling of face and throat  . A fast heartbeat  . A bad rash all over body  . Dizziness and weakness   Immunizations Administered    Name Date Dose VIS Date Route   Moderna COVID-19 Vaccine 05/15/2019  9:55 AM 0.5 mL 12/2018 Intramuscular   Manufacturer: Moderna   Lot: [id number]   NDC: 99278-004-47

## 2019-09-06 DIAGNOSIS — I708 Atherosclerosis of other arteries: Secondary | ICD-10-CM | POA: Diagnosis not present

## 2019-09-06 DIAGNOSIS — S8991XA Unspecified injury of right lower leg, initial encounter: Secondary | ICD-10-CM | POA: Diagnosis not present

## 2019-09-06 DIAGNOSIS — E785 Hyperlipidemia, unspecified: Secondary | ICD-10-CM | POA: Diagnosis not present

## 2019-09-06 DIAGNOSIS — E1165 Type 2 diabetes mellitus with hyperglycemia: Secondary | ICD-10-CM | POA: Diagnosis not present

## 2019-09-06 DIAGNOSIS — M25561 Pain in right knee: Secondary | ICD-10-CM | POA: Diagnosis not present

## 2019-09-06 DIAGNOSIS — I10 Essential (primary) hypertension: Secondary | ICD-10-CM | POA: Diagnosis not present

## 2019-09-06 DIAGNOSIS — Z0000 Encounter for general adult medical examination without abnormal findings: Secondary | ICD-10-CM | POA: Diagnosis not present

## 2019-10-04 DIAGNOSIS — Z79899 Other long term (current) drug therapy: Secondary | ICD-10-CM | POA: Diagnosis not present

## 2019-10-04 DIAGNOSIS — R42 Dizziness and giddiness: Secondary | ICD-10-CM | POA: Diagnosis not present

## 2019-10-04 DIAGNOSIS — Z794 Long term (current) use of insulin: Secondary | ICD-10-CM | POA: Diagnosis not present

## 2019-10-04 DIAGNOSIS — I10 Essential (primary) hypertension: Secondary | ICD-10-CM | POA: Diagnosis not present

## 2019-10-04 DIAGNOSIS — R55 Syncope and collapse: Secondary | ICD-10-CM | POA: Diagnosis not present

## 2019-10-04 DIAGNOSIS — E11649 Type 2 diabetes mellitus with hypoglycemia without coma: Secondary | ICD-10-CM | POA: Diagnosis not present

## 2019-10-04 DIAGNOSIS — R011 Cardiac murmur, unspecified: Secondary | ICD-10-CM | POA: Diagnosis not present

## 2019-10-04 DIAGNOSIS — E7800 Pure hypercholesterolemia, unspecified: Secondary | ICD-10-CM | POA: Diagnosis not present

## 2019-10-05 DIAGNOSIS — E119 Type 2 diabetes mellitus without complications: Secondary | ICD-10-CM | POA: Diagnosis not present

## 2019-10-05 DIAGNOSIS — Z794 Long term (current) use of insulin: Secondary | ICD-10-CM | POA: Diagnosis not present

## 2019-10-05 DIAGNOSIS — I10 Essential (primary) hypertension: Secondary | ICD-10-CM | POA: Diagnosis not present

## 2019-10-08 DIAGNOSIS — Z23 Encounter for immunization: Secondary | ICD-10-CM | POA: Diagnosis not present

## 2019-10-08 DIAGNOSIS — E162 Hypoglycemia, unspecified: Secondary | ICD-10-CM | POA: Diagnosis not present

## 2019-10-08 DIAGNOSIS — Z09 Encounter for follow-up examination after completed treatment for conditions other than malignant neoplasm: Secondary | ICD-10-CM | POA: Diagnosis not present

## 2019-10-08 DIAGNOSIS — E1140 Type 2 diabetes mellitus with diabetic neuropathy, unspecified: Secondary | ICD-10-CM | POA: Diagnosis not present

## 2019-10-15 DIAGNOSIS — E1140 Type 2 diabetes mellitus with diabetic neuropathy, unspecified: Secondary | ICD-10-CM | POA: Diagnosis not present

## 2019-10-15 DIAGNOSIS — E162 Hypoglycemia, unspecified: Secondary | ICD-10-CM | POA: Diagnosis not present

## 2019-10-26 DIAGNOSIS — N319 Neuromuscular dysfunction of bladder, unspecified: Secondary | ICD-10-CM | POA: Diagnosis not present

## 2019-10-26 DIAGNOSIS — E119 Type 2 diabetes mellitus without complications: Secondary | ICD-10-CM | POA: Diagnosis not present

## 2019-11-12 DIAGNOSIS — M1711 Unilateral primary osteoarthritis, right knee: Secondary | ICD-10-CM | POA: Diagnosis not present

## 2019-11-14 DIAGNOSIS — Z23 Encounter for immunization: Secondary | ICD-10-CM | POA: Diagnosis not present

## 2019-11-14 DIAGNOSIS — M179 Osteoarthritis of knee, unspecified: Secondary | ICD-10-CM | POA: Diagnosis not present

## 2019-11-14 DIAGNOSIS — E1140 Type 2 diabetes mellitus with diabetic neuropathy, unspecified: Secondary | ICD-10-CM | POA: Diagnosis not present

## 2019-11-26 DIAGNOSIS — E119 Type 2 diabetes mellitus without complications: Secondary | ICD-10-CM | POA: Diagnosis not present

## 2019-11-26 DIAGNOSIS — N319 Neuromuscular dysfunction of bladder, unspecified: Secondary | ICD-10-CM | POA: Diagnosis not present

## 2019-12-03 ENCOUNTER — Ambulatory Visit: Payer: Medicare HMO

## 2020-01-04 DIAGNOSIS — N319 Neuromuscular dysfunction of bladder, unspecified: Secondary | ICD-10-CM | POA: Diagnosis not present

## 2020-01-04 DIAGNOSIS — E119 Type 2 diabetes mellitus without complications: Secondary | ICD-10-CM | POA: Diagnosis not present

## 2020-01-05 DIAGNOSIS — E119 Type 2 diabetes mellitus without complications: Secondary | ICD-10-CM | POA: Diagnosis not present

## 2020-01-05 DIAGNOSIS — N319 Neuromuscular dysfunction of bladder, unspecified: Secondary | ICD-10-CM | POA: Diagnosis not present

## 2020-01-09 IMAGING — MG DIGITAL SCREENING BILATERAL MAMMOGRAM WITH TOMO AND CAD
6 of 10 series · 6 of 30 positions shown · non-contrast
Comparison: Previous exam(s).

CLINICAL DATA: Screening.

EXAM:
DIGITAL SCREENING BILATERAL MAMMOGRAM WITH TOMO AND CAD

[L CC synth-2D]
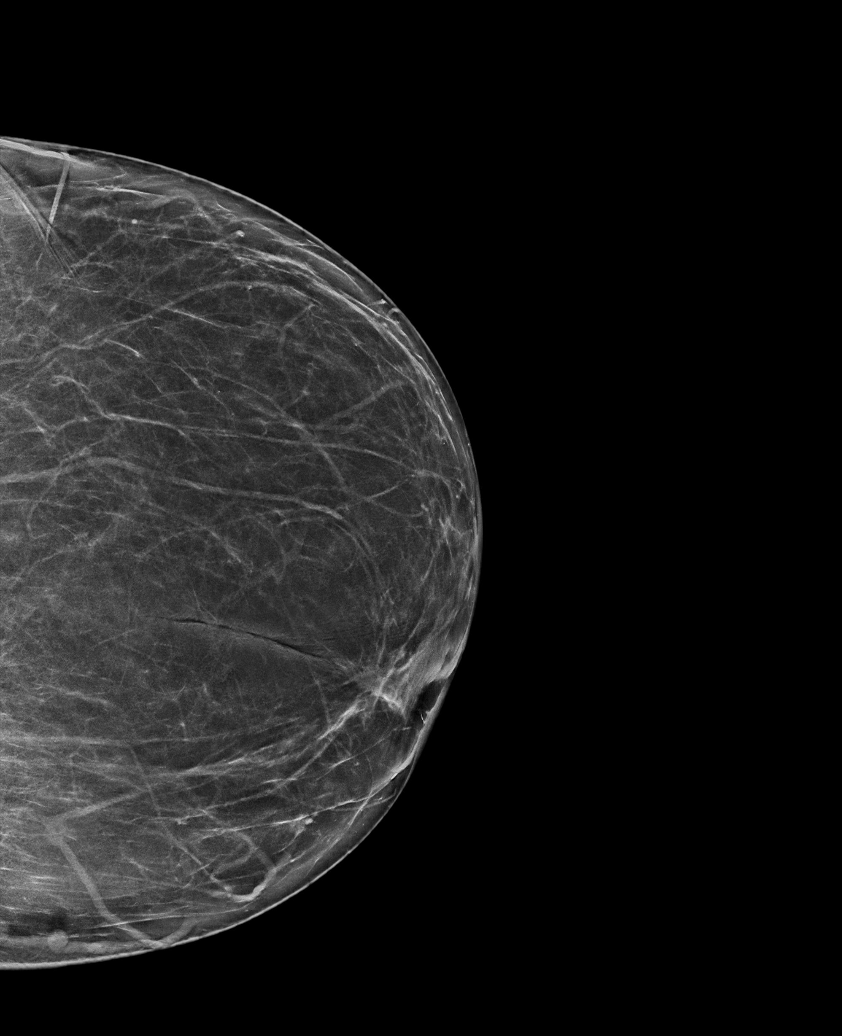

[R MLO synth-2D]
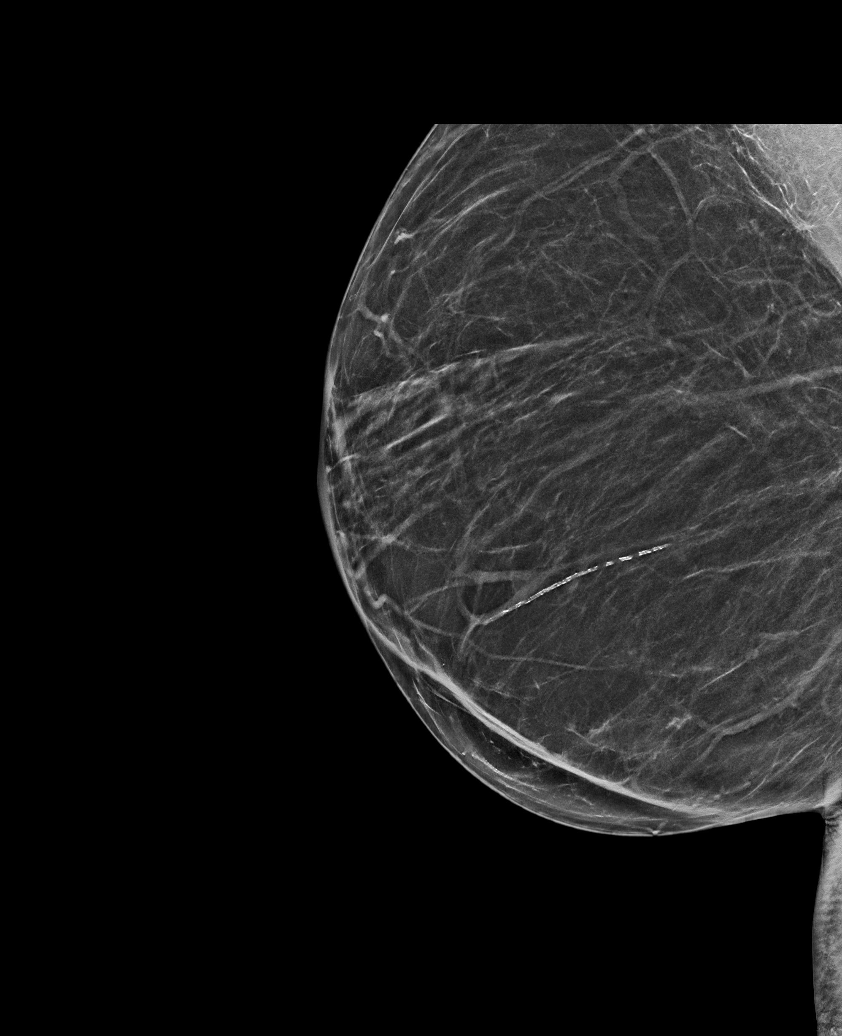

[L MLO synth-2D (1 of 2)]
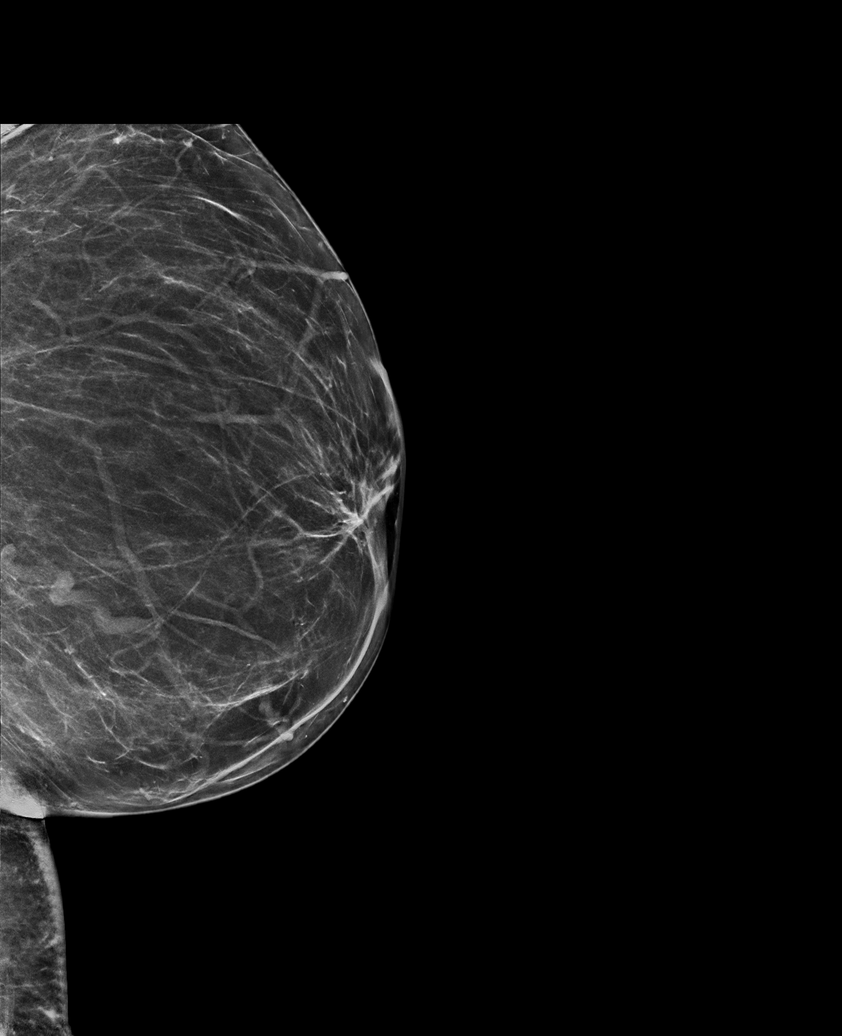

[R CC synth-2D]
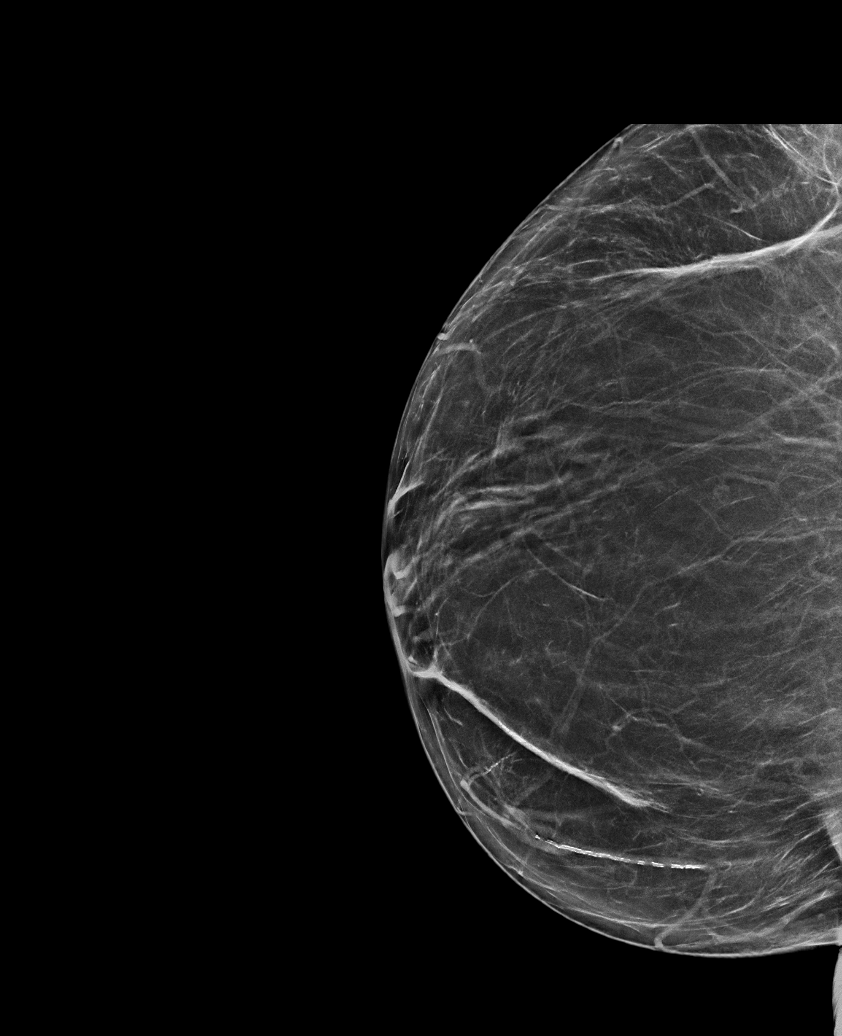

[L MLO synth-2D (2 of 2)]
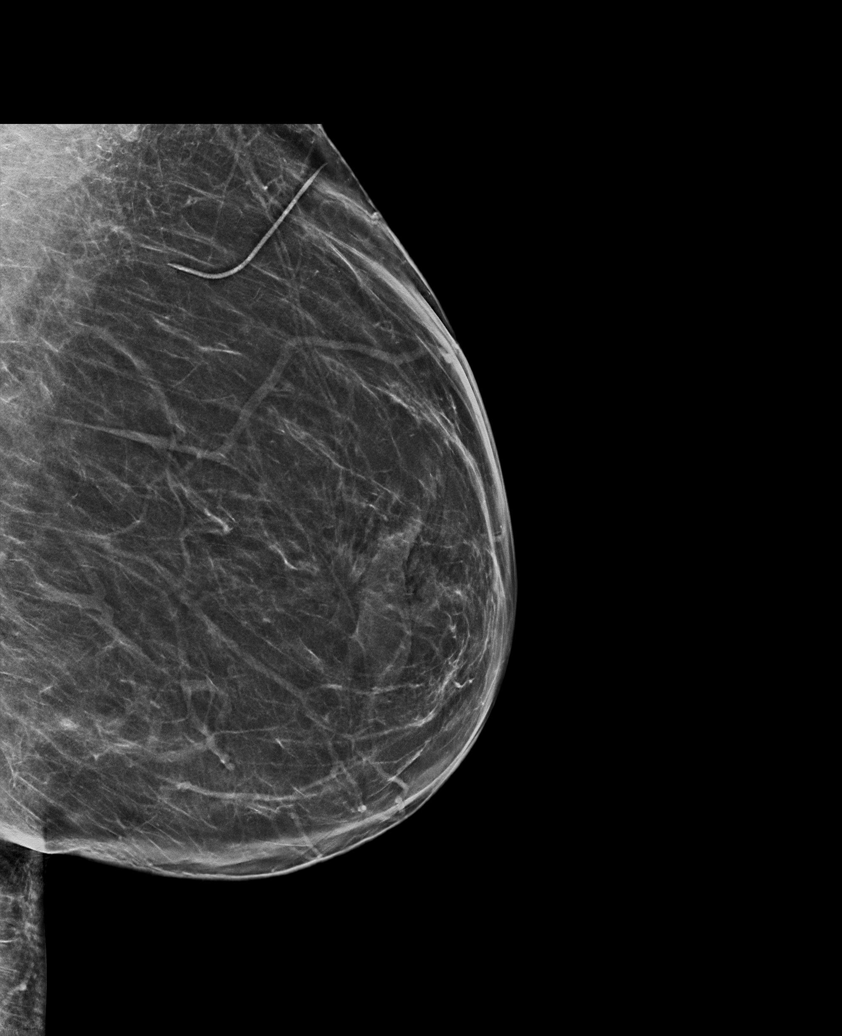

[L MLO tomo · tomo slice 37/72.0]
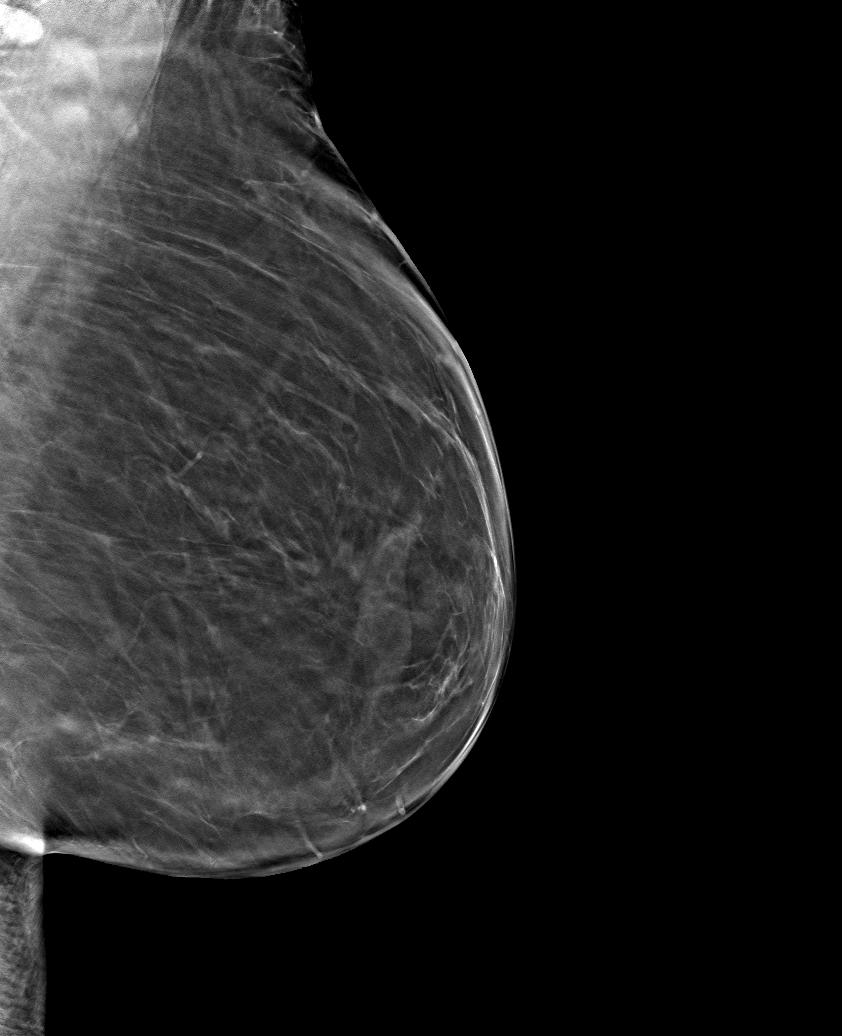

[6 of 30 positions shown; findings below may reference images not displayed]

ACR Breast Density Category b: There are scattered areas of
fibroglandular density.
FINDINGS: There are no findings suspicious for malignancy. Images were
processed with CAD.
IMPRESSION: No mammographic evidence of malignancy. A result letter of this
screening mammogram will be mailed directly to the patient.

RECOMMENDATION:
Screening mammogram in one year. (Code:[id number])

BI-RADS CATEGORY  1: Negative.

## 2020-02-05 DIAGNOSIS — E119 Type 2 diabetes mellitus without complications: Secondary | ICD-10-CM | POA: Diagnosis not present

## 2020-02-05 DIAGNOSIS — N319 Neuromuscular dysfunction of bladder, unspecified: Secondary | ICD-10-CM | POA: Diagnosis not present

## 2020-03-27 DIAGNOSIS — I10 Essential (primary) hypertension: Secondary | ICD-10-CM | POA: Diagnosis not present

## 2020-03-27 DIAGNOSIS — E785 Hyperlipidemia, unspecified: Secondary | ICD-10-CM | POA: Diagnosis not present

## 2020-03-27 DIAGNOSIS — J45909 Unspecified asthma, uncomplicated: Secondary | ICD-10-CM | POA: Diagnosis not present

## 2020-03-27 DIAGNOSIS — D649 Anemia, unspecified: Secondary | ICD-10-CM | POA: Diagnosis not present

## 2020-03-27 DIAGNOSIS — E1140 Type 2 diabetes mellitus with diabetic neuropathy, unspecified: Secondary | ICD-10-CM | POA: Diagnosis not present

## 2020-03-27 DIAGNOSIS — Z1152 Encounter for screening for COVID-19: Secondary | ICD-10-CM | POA: Diagnosis not present

## 2020-04-02 DIAGNOSIS — Z1152 Encounter for screening for COVID-19: Secondary | ICD-10-CM | POA: Diagnosis not present

## 2020-04-04 DIAGNOSIS — M179 Osteoarthritis of knee, unspecified: Secondary | ICD-10-CM | POA: Diagnosis not present

## 2020-04-04 DIAGNOSIS — E785 Hyperlipidemia, unspecified: Secondary | ICD-10-CM | POA: Diagnosis not present

## 2020-04-04 DIAGNOSIS — E1140 Type 2 diabetes mellitus with diabetic neuropathy, unspecified: Secondary | ICD-10-CM | POA: Diagnosis not present

## 2020-04-04 DIAGNOSIS — J309 Allergic rhinitis, unspecified: Secondary | ICD-10-CM | POA: Diagnosis not present

## 2020-04-04 DIAGNOSIS — Z0000 Encounter for general adult medical examination without abnormal findings: Secondary | ICD-10-CM | POA: Diagnosis not present

## 2020-04-04 DIAGNOSIS — I10 Essential (primary) hypertension: Secondary | ICD-10-CM | POA: Diagnosis not present

## 2020-04-04 DIAGNOSIS — K219 Gastro-esophageal reflux disease without esophagitis: Secondary | ICD-10-CM | POA: Diagnosis not present

## 2020-04-11 DIAGNOSIS — Z1152 Encounter for screening for COVID-19: Secondary | ICD-10-CM | POA: Diagnosis not present

## 2020-04-18 DIAGNOSIS — E1140 Type 2 diabetes mellitus with diabetic neuropathy, unspecified: Secondary | ICD-10-CM | POA: Diagnosis not present

## 2020-05-05 ENCOUNTER — Other Ambulatory Visit: Payer: Self-pay | Admitting: Orthopedic Surgery

## 2020-05-05 DIAGNOSIS — M25561 Pain in right knee: Secondary | ICD-10-CM

## 2020-05-08 DIAGNOSIS — Z1152 Encounter for screening for COVID-19: Secondary | ICD-10-CM | POA: Diagnosis not present

## 2020-05-15 DIAGNOSIS — Z1152 Encounter for screening for COVID-19: Secondary | ICD-10-CM | POA: Diagnosis not present

## 2020-05-29 DIAGNOSIS — Z1152 Encounter for screening for COVID-19: Secondary | ICD-10-CM | POA: Diagnosis not present

## 2020-06-02 ENCOUNTER — Inpatient Hospital Stay: Payer: Medicare Other | Attending: Neurology | Admitting: Genetic Counselor

## 2020-06-02 ENCOUNTER — Inpatient Hospital Stay: Payer: Medicare Other

## 2020-06-02 ENCOUNTER — Other Ambulatory Visit: Payer: Self-pay

## 2020-06-02 DIAGNOSIS — Z803 Family history of malignant neoplasm of breast: Secondary | ICD-10-CM | POA: Diagnosis not present

## 2020-06-02 DIAGNOSIS — Z8481 Family history of carrier of genetic disease: Secondary | ICD-10-CM

## 2020-06-02 LAB — GENETIC SCREENING ORDER

## 2020-06-03 ENCOUNTER — Encounter: Payer: Self-pay | Admitting: Genetic Counselor

## 2020-06-03 DIAGNOSIS — Z8481 Family history of carrier of genetic disease: Secondary | ICD-10-CM

## 2020-06-03 DIAGNOSIS — Z803 Family history of malignant neoplasm of breast: Secondary | ICD-10-CM

## 2020-06-03 HISTORY — DX: Family history of carrier of genetic disease: Z84.81

## 2020-06-03 HISTORY — DX: Family history of malignant neoplasm of breast: Z80.3

## 2020-06-03 NOTE — Progress Notes (Signed)
REFERRING PROVIDER: None for this encounter   PRIMARY PROVIDER:  Jani Gravel, MD  PRIMARY REASON FOR VISIT:  1. Family history of breast cancer   2. Family history of BRCA2 gene positive     HISTORY OF PRESENT ILLNESS:   Tammy Arellano, a 55 y.o. female, was seen for a [facility] cancer genetics consultation due to a family history of cancer and a known BRCA2 mutation in her mother.  Tammy Arellano presents to clinic today with her sister, Tito Dine, to discuss the possibility of a hereditary predisposition to cancer, to discuss genetic testing, and to further clarify her future cancer risks, as well as potential cancer risks for family members.    Tammy Arellano is a 56 y.o. female with no personal history of cancer.    CANCER HISTORY:  Oncology History   No history exists.    RISK FACTORS:  Menarche was at age ~55.  Nulliparous.  OCP use: unknown  Ovaries intact: yes.  Hysterectomy: no.  Menopausal status: postmenopausal.  HRT use: unknown Colonoscopy: yes; in past 10 years per patient. Mammogram within the last year: no; scheduled for upcoming mammogram Number of breast biopsies: biopsy at age 40; periductal sclerosis and small benign sclerosing lesion Up to date with pelvic exams: no.  Past Medical History:  Diagnosis Date  . Asthma   . Diabetes mellitus   . Dyslipidemia   . Epileptic seizures ([place])   . Family history of BRCA2 gene positive 06/03/2020  . Family history of breast cancer 06/03/2020  . Hypertension   . Mental retardation   . Obesity   . Pseudoseizures [facility])     Past Surgical History:  Procedure Laterality Date  . BREAST CYST EXCISION Left ~ 1988  . REDUCTION MAMMAPLASTY Bilateral ~ 1988    Social History   Socioeconomic History  . Marital status: Single    Spouse name: Not on file  . Number of children: 0  . Years of education: 95  . Highest education level: Not on file  Occupational History  . Not on file  Tobacco Use  . Smoking status: Never  Smoker  . Smokeless tobacco: Never Used  Vaping Use  . Vaping Use: Never used  Substance and Sexual Activity  . Alcohol use: No  . Drug use: No  . Sexual activity: Never  Other Topics Concern  . Not on file  Social History Narrative   Patient is single with no children and resides with her mother.   Patient is left handed      Social Determinants of Health   Financial Resource Strain: Not on file  Food Insecurity: Not on file  Transportation Needs: Not on file  Physical Activity: Not on file  Stress: Not on file  Social Connections: Not on file     FAMILY HISTORY:  We obtained a detailed, 4-generation family history.  Significant diagnoses are listed below: Family History  Problem Relation Age of Onset  . Breast cancer Mother 47       triple negative; BRCA2 positive  . Lung cancer Father        d. 19; smoking hx  . Breast cancer Maternal Grandmother        dx after 50  . Colon cancer Maternal Aunt 11  . Prostate cancer Maternal Uncle        dx after 50  . Kidney cancer Maternal Uncle        dx after 28     Tammy Arellano has two  two sisters, one full brother, and one maternal half brother, all without a history of cancer.  Tammy Arellano's mother, age 64, was diagnosed with triple negative breast cancer at age 18.  She had genetic testing through [facility], which includes sequencing and/or deletion/duplication analysis of the following 84 genes:  AIP*, ALK, APC*, ATM*, AXIN2*, BAP1*, BARD1*, BLM*, BMPR1A*, BRCA1*, BRCA2*, BRIP1*, CASR, CDC73*, CDH1*, CDK4, CDKN1B*, CDKN1C*, CDKN2A, CEBPA, CHEK2*, CTNNA1*, DICER1*, DIS3L2*, EGFR, EPCAM, FH*, FLCN*, GATA2*, GPC3, GREM1, HOXB13, HRAS, KIT, MAX*, MEN1*, MET, MITF, MLH1*, MSH2*, MSH3*, MSH6*, MUTYH*, NBN*, NF1*, NF2*, NTHL1*, PALB2*, PDGFRA, PHOX2B, PMS2*, POLD1*, POLE*, POT1*, PRKAR1A*, PTCH1*, PTEN*, RAD50*, RAD51C*, RAD51D*, RB1*, RECQL4, RET, RUNX1*, SDHA*, SDHAF2*, SDHB*, SDHC*, SDHD*, SMAD4*, SMARCA4*, SMARCB1*, SMARCE1*, STK11*,  SUFU*, TERC, TERT, TMEM127*, Tp53*, TSC1*, TSC2*, VHL*, WRN*, and WT1.  RNA analysis is performed for * genes.  Tammy Arellano' mother was found to have a pathogenic variant in BRCA2 called c.4284dup (p.Gln1429Serfs*9).  Her mother also had a variant of uncertain significance in MSH3 called c.2600T>C (p.Ile867Thr).  No other deleterious or uncertain variants were detected in her mother's testing.  Tammy Arellano has a maternal uncle with kidney cancer, a maternal uncle with prostate cancer, a maternal aunt with colon cancer, and a maternal grandmother with breast cancer diagnosed after 55.   Tammy Arellano's father died at age 55 and had a history of lung cancer.  She had some limited information about her paternal family history but no other cancer was reported.   Tammy Arellano is unaware of previous family history of genetic testing for hereditary cancer risks besides that mentioned above. There is no reported Ashkenazi Jewish ancestry. There is no known consanguinity.  GENETIC COUNSELING ASSESSMENT: Tammy Arellano is a 55 y.o. female with a family history of a known hereditary cancer syndrome. We, therefore, discussed and recommended the following at today's visit.   DISCUSSION: We discussed that 5 - 10% of cancer is hereditary, with most cases of hereditary breast cancer associated with mutations in BRCA1/2.  We discussed that given her mother has a mutation in BRCA2, there is a 50% chance that she also has the same mutation in BRCA2.  We reviewed the breast, ovarian, pancreatic, melanoma, and other cancer risks associated with BRCA2 mutations and reviewed available management strategies for those in the family with the familial mutation. We discussed that testing is beneficial for several reasons, including knowing about other cancer risks, identifying potential screening and risk-reduction options that may be appropriate,and to understanding if other family members could be at risk for cancer and allowing them  to undergo genetic testing.  We reviewed the characteristics, features and inheritance patterns of hereditary cancer syndromes. We also discussed genetic testing, including the appropriate family members to test, the process of testing, insurance coverage and turn-around-time for results. We discussed the implications of a negative, positive, carrier and/or variant of uncertain significant result.  We recommended Tammy Arellano pursue genetic testing for the BRCA2 gene (sequencing and deletion/duplicaiton analysis).  Tammy Arellano genetic testing also included sequencing and deletion/duplication analysis of MSH3 given the variant of uncertain significance detected in her mother in this gene.     Based on Tammy Arellano's family history of breast cancer and family history of BRCA2 mutation, she meets medical criteria for genetic testing.  She should not have an out of pocket Arellano associated with this test given that [facility] is offering no-charge family variant testing within 150 days of her mother's report date.   We discussed that some people  do not want to undergo genetic testing due to fear of genetic discrimination.  A federal law called the Genetic Information Non-Discrimination Act (GINA) of 2008 helps protect individuals against genetic discrimination based on their genetic test results.  It impacts both health insurance and employment.  With health insurance, it protects against increased premiums, being kicked off insurance or being forced to take a test in order to be insured.  For employment it protects against hiring, firing and promoting decisions based on genetic test results.  GINA does not apply to those in the [employer], those who work for companies with less than 15 employees, and new life insurance or long-term disability insurance policies.  Health status due to a cancer diagnosis is not protected under GINA.  PLAN: After considering the risks, benefits, and limitations, Tammy Arellano provided  informed consent to pursue genetic testing and the blood sample was sent to [facility] for sequencing and deletion/duplication analysis of BRCA2 and MSH3. Results should be available within approximately 3 weeks' time, at which point they will be disclosed by telephone to Tammy Arellano, as will any additional recommendations warranted by these results. Tammy Arellano will receive a summary of her genetic counseling visit and a copy of her results once available. This information will also be available in Epic.   Lastly, we encouraged Tammy Arellano to remain in contact with cancer genetics annually so that we can continuously update the family history and inform her of any changes in cancer genetics and testing that may be of benefit for this family.   Ms. Torrance's questions were answered to her satisfaction today. Our contact information was provided should additional questions or concerns arise. Thank you for the referral and allowing [place] to share in the care of your patient.   Allanna Bresee M. Joette Catching, [place], [facility] Genetic Counselor [email or web address] (P) [phone]   The patient was seen for a total of 40 minutes in face-to-face genetic counseling.  Drs. Magrinat, Lindi Adie and/or Burr Medico were available to discuss this case as needed.  _______________________________________________________________________ For Office Staff:  Number of people involved in session: 2 Was an Intern/ student involved with case: no

## 2020-06-06 DIAGNOSIS — Z1152 Encounter for screening for COVID-19: Secondary | ICD-10-CM | POA: Diagnosis not present

## 2020-06-11 DIAGNOSIS — Z1152 Encounter for screening for COVID-19: Secondary | ICD-10-CM | POA: Diagnosis not present

## 2020-06-18 DIAGNOSIS — Z1152 Encounter for screening for COVID-19: Secondary | ICD-10-CM | POA: Diagnosis not present

## 2020-06-27 ENCOUNTER — Encounter: Payer: Self-pay | Admitting: Genetic Counselor

## 2020-06-27 ENCOUNTER — Ambulatory Visit: Payer: Self-pay | Admitting: Genetic Counselor

## 2020-06-27 ENCOUNTER — Telehealth: Payer: Self-pay | Admitting: Genetic Counselor

## 2020-06-27 DIAGNOSIS — Z1501 Genetic susceptibility to malignant neoplasm of breast: Secondary | ICD-10-CM

## 2020-06-27 DIAGNOSIS — Z1379 Encounter for other screening for genetic and chromosomal anomalies: Secondary | ICD-10-CM | POA: Insufficient documentation

## 2020-06-27 DIAGNOSIS — Z803 Family history of malignant neoplasm of breast: Secondary | ICD-10-CM

## 2020-06-27 DIAGNOSIS — Z1509 Genetic susceptibility to other malignant neoplasm: Secondary | ICD-10-CM | POA: Insufficient documentation

## 2020-06-27 DIAGNOSIS — Z8481 Family history of carrier of genetic disease: Secondary | ICD-10-CM

## 2020-06-27 NOTE — Progress Notes (Signed)
GENETIC TEST RESULTS  Patient Name: Tammy Arellano Patient Age: 55 y.o. Encounter Date: 06/26/2020  Tammy Arellano was seen in the [facility] clinic on Jun 02, 2020 due to a family history of cancer and concern regarding a hereditary predisposition to cancer and a family history of a known pathogenic variant in [place] in the family. Please refer to the prior Genetics clinic note for more information regarding Tammy Arellano's medical and family histories and our assessment at the time.    FAMILY HISTORY: We obtained a detailed, 4-generation family history. Significant diagnoses are listed below:      Family History  Problem Relation Age of Onset  . Breast cancer Maternal Grandmother    dx after 50  . Lung cancer Father    smoking hx  . Breast cancer Mother 74   triple negative; BRCA2 mutation  . Kidney cancer Maternal Uncle    dx after 47  . Prostate cancer Maternal Uncle    dx after 50  . Colon cancer Maternal Aunt 31  . Wilm's tumor Nephew 3       Tammy Arellano has two two sisters, one full brother, and one maternal half brother, all without a history of cancer. Tammy Arellano's mother, age 7, was diagnosed with triple negative breast cancer at age 71. She had genetic testing through [facility], which includes sequencing and/or deletion/duplication analysis of the following 84 genes: AIP*, ALK, APC*, ATM*, AXIN2*, BAP1*, BARD1*, BLM*, BMPR1A*, BRCA1*, BRCA2*, BRIP1*, CASR, CDC73*, CDH1*, CDK4, CDKN1B*, CDKN1C*, CDKN2A, CEBPA, CHEK2*, CTNNA1*, DICER1*, DIS3L2*, EGFR, EPCAM, FH*, FLCN*, GATA2*, GPC3, GREM1, HOXB13, HRAS, KIT, MAX*, MEN1*, MET, MITF, MLH1*, MSH2*, MSH3*, MSH6*, MUTYH*, NBN*, NF1*, NF2*, NTHL1*, PALB2*, PDGFRA, PHOX2B, PMS2*, POLD1*, POLE*, POT1*, PRKAR1A*, PTCH1*, PTEN*, RAD50*, RAD51C*, RAD51D*, RB1*, RECQL4, RET, RUNX1*, SDHA*, SDHAF2*, SDHB*, SDHC*, SDHD*, SMAD4*, SMARCA4*, SMARCB1*, SMARCE1*, STK11*, SUFU*, TERC, TERT, TMEM127*,  Tp53*, TSC1*, TSC2*, VHL*, WRN*, andWT1. RNA analysis is performed for * genes. Tammy Arellano' mother was found to have a pathogenic variant inBRCA2 calledc.4284dup (p.Gln1429Serfs*9). Her mother also had a variant of uncertain significance in MSH3called c.2600T>C (p.Ile867Thr). No other deleterious or uncertain variants were detected in her mother's testing. Tammy Arellano has a maternal uncle with kidney cancer, a maternal uncle with prostate cancer, a maternal aunt with colon cancer, and a maternal grandmother with breast cancer diagnosed after 75.   Tammy Arellano's father died at age 41 and had a history of lung cancer. She had some limited information about her paternal family history but no other cancer was reported.   TammyBullockis unawareof previous family history of genetic testing for hereditary cancer risks besides that mentioned above. Thereis noreported Ashkenazi Jewish ancestry. There is noknown consanguinity.  GENETIC TESTING: At the time of Tammy Arellano's visit, we recommended she pursue genetic testing for the specific BRCA2 mutation that was identified in her mother. The genetic testing through [facility], which includes sequencing and deletion/duplication analysis of BRCA2 and MSH3 identified a single, heterozygous pathogenic gene mutation called BRCA2 c.4284dup (p.Gln1429Serfs*9).   The test report will be scanned into EPIC and located under the Molecular Pathology section of the Results Review tab. A portion of the result report is included below for reference.      CANCER RISKS: Both females and males with a BRCA2 mutation are at an increased risk for cancer. Studies show that females with a BRCA2 mutation can have a 61-77% lifetime risk to develop breast cancer, up to a 26% risk to develop a second breast cancer within  20 years, and up to a 11-25% risk to develop ovarian cancer. Males can have a 7-8% lifetime risk to develop female breast cancer and a 20-30% risk for  prostate cancer. Both males and females can also have a 3-5% increased risk for pancreatic cancer and a 3-5% increased risk for melanoma.  CANCER RISK REDUCTION & SCREENING RECOMMENDATIONS: The [place] (NCCN) recommends the following for women who carry BRCA mutations: Breast awareness starting at the age of 47 years; women should report any changes to their breasts to their health care provider. Periodic breast self-exams may facilitate breast self-awareness. Clinical breast exams every 6-12 months, starting at age 55 years Annual breast MRI and annual mammograms starting at age 2 years and 30 years, respectively, or individualized based on age of earliest onset of breast cancer in the family. Consideration of risk reducing mastectomy, which reduces the risk of breast cancer by greater than 90%. Consideration of risk reducing salpingo-oophorectomy (RRSO), ideally between the ages of 77-40, or individualized based on completion of child-bearing years or age of earliest onset of ovarian cancer in the family. RRSO reduces the risk of ovarian cancer by greater than 90% and can reduce the risk of breast cancer by 50% if performed prior to menopause. Women who have undergone risk reducing RRSO have a small risk of peritoneal carcinoma and can consider annual CA-125 surveillance. For women who still have their ovaries, transvaginal ultrasound and CA-125 testing every 6 months starting at age 69 years, or 5-10 years prior to the earliest age of onset of ovarian cancer in the family can be considered. Studies have not demonstrated that ovarian cancer screening is effective in detecting early ovarian cancer. Consideration of chemoprevention options such as oral contraceptives, Tamoxifen and Raloxifene, if appropriate for an individual.   The following is recommended for both males and females who carry a BRCA2 mutation: Consideration of pancreatic cancer screening if there is a  family history of pancreatic cancer.  Tammy Arellano did not report a family history of pancreatic cancer, thus pancreatic cancer screening is not recommended at this time.  General melanoma risk management, such as annual full-body skin examination and minimizing UV exposure Consider investigational imaging and screening studies, when available (e.g. novel imaging technologies, more frequent screening intervals) in the context of a clinical trial  RISK REDUCTION: There are several things that can be offered to individuals who are carriers for BRCA mutations that will reduce the risk for getting cancer.   The use of oral contraceptives can lower the risk for ovarian cancer, and, per case control studies, does not significantly increase the risk for breast cancer in BRCA patients. Case control studies have shown that oral contraceptives can lower the risk for ovarian cancer in women with BRCA mutations. Additionally, a more recent meta-analysis, including one corhort (n=3,181) and one case control study (1,096 cases and 2,878 controls) also showed an inverse correlation between ovarian cancer and ever having used oral contraceptives (OR, 0.58; 95% CI = 0.46-0.73). Studies on oral contraceptives and breast cancer have been conflicting, with some studies suggesting that there is not an increased risk for breast cancer in BRCA mutation carriers, while others suggest that there could be a risk. That said, two meta-analysis studies have shown that there is not an increased risk for breast cancer with oral contraceptive use in BRCA1 and BRCA2 carriers.   In individuals who have a prophylactic bilateral salpino-oophorectomy (BSO), the risk for breast cancer is reduced by up to 50%.  It has been reported that short term hormone replacement therapy in women undergoing prophylactic BSO does not negate the reduction of breast cancer risk associated with surgery (1.2018 NCCN guidelines).  MEDICAL MANAGEMENT:  Females who have a BRCA mutation have an increased risk for both breast and ovarian cancer.   As discussed with Ms. Zaragoza, to reduce the risk for breast cancer, prophylactic bilateral mastectomy is the most effective option for risk reduction. However, for women who choose to keep their breasts intensified screening is equally safe.  We recommend yearly mammograms, yearly breast MRI, twice-yearly clinical breast exams, and monthly self-breast exams.  She will be seen in our high risk clinic to discuss surgical or high risk screening options.  She requested to be seen by Dr. Burr Medico given that her mother is followed by Dr. Burr Medico for her cancer history.   To reduce the risk for ovarian cancer, we recommend Ms. Orzechowski have a prophylactic bilateral salpingo-oophorectomy when childbearing is completed, if planned. We discussed that screening with CA-125 blood tests and transvaginal ultrasounds can be done twice per year. However, these tests have not been shown to detect ovarian cancer at an early stage.  Ms. Lasater does not have a gynecologist at this point in time.  She receives her gynecological care through PCP Dr. Janie Morning.  Ms. Amescua was informed to speak with Dr. Theda Sers about appropriate next steps regarding ovarian cancer risk.   FAMILY MEMBERS: It is important that all of Ms. Birge's relatives (both males and females) know of the presence of this gene mutation. Site-specific genetic testing can sort out who in the family is at risk and who is not.    Ms. Henrichs's siblings have a 50% chance to have inherited this mutation. We recommend they have genetic testing for this same mutation, as identifying the presence of this mutation would allow them to also take advantage of risk-reducing measures.   Additionally, individuals with a pathogenic variant in BRCA2 are carriers of Fanconi anemia. Fanconi anemia is an autosomal recessive disorder that is characterized by bone marrow failure and  variable presentation of anomalies, including short stature, abnormal skin pigmentation, abnormal thumbs, malformations of the skeletal and central nervous systems, and developmental delay. Risks for leukemia and early onset solid tumors are significantly elevated. For there to be a risk of Fanconi anemia in offspring, both parents would each have to have a single pathogenic variant in BRCA2; in such a case, the risk of having an affected child is 25%.  SUPPORT AND RESOURCES: If Ms. Kath is interested in BRCA-specific information and support, there are two groups, Facing Our Risk (www.facingourrisk.com) and Bright Pink (www.brightpink.org) which some people have found useful. They provide opportunities to speak with other individuals from high-risk families. To locate genetic counselors in other cities, visit the website of the [employer] (ArtistMovie.se) and Secretary/administrator for a Social worker by zip code.  We encouraged Ms. Monaco to remain in contact with [place] on an annual basis so we can update her personal and family histories, and let her know of advances in cancer genetics that may benefit the family. Our contact number was provided. Ms. Chalmers's questions were answered to her satisfaction today, and she knows she is welcome to call anytime with additional questions.   Eloisa Chokshi M. Joette Catching, [place], [facility] Genetic Counselor [email or web address] (P) [phone]

## 2020-06-27 NOTE — Telephone Encounter (Addendum)
Revealed pathogenic variant in BRCA2 gene.  Reviewed cancer risks, management, and family implications.  Interested in referral to high risk clinic.

## 2020-06-28 DIAGNOSIS — Z1152 Encounter for screening for COVID-19: Secondary | ICD-10-CM | POA: Diagnosis not present

## 2020-07-01 ENCOUNTER — Telehealth: Payer: Self-pay | Admitting: Hematology

## 2020-07-01 DIAGNOSIS — Z1152 Encounter for screening for COVID-19: Secondary | ICD-10-CM | POA: Diagnosis not present

## 2020-07-01 NOTE — Telephone Encounter (Signed)
Scheduled appt per 6/3 referral. Spoke to pt's sister who is aware of appts. Pt will see Dr. Mosetta Putt same time as sister due to transportation issues.

## 2020-07-02 ENCOUNTER — Other Ambulatory Visit: Payer: Self-pay | Admitting: Family Medicine

## 2020-07-02 DIAGNOSIS — Z1231 Encounter for screening mammogram for malignant neoplasm of breast: Secondary | ICD-10-CM

## 2020-07-04 ENCOUNTER — Emergency Department (HOSPITAL_COMMUNITY): Payer: Medicare Other

## 2020-07-04 ENCOUNTER — Encounter (HOSPITAL_COMMUNITY): Payer: Self-pay

## 2020-07-04 ENCOUNTER — Encounter: Payer: Medicare Other | Admitting: Hematology

## 2020-07-04 ENCOUNTER — Emergency Department (HOSPITAL_COMMUNITY)
Admission: EM | Admit: 2020-07-04 | Discharge: 2020-07-04 | Disposition: A | Discharge status: Home / Self Care | Payer: Medicare Other | Attending: Emergency Medicine | Admitting: Emergency Medicine

## 2020-07-04 ENCOUNTER — Other Ambulatory Visit: Payer: Self-pay

## 2020-07-04 DIAGNOSIS — U071 COVID-19: Secondary | ICD-10-CM | POA: Diagnosis not present

## 2020-07-04 DIAGNOSIS — Z91040 Latex allergy status: Secondary | ICD-10-CM | POA: Diagnosis not present

## 2020-07-04 DIAGNOSIS — R6889 Other general symptoms and signs: Secondary | ICD-10-CM | POA: Diagnosis not present

## 2020-07-04 DIAGNOSIS — R059 Cough, unspecified: Secondary | ICD-10-CM | POA: Diagnosis not present

## 2020-07-04 DIAGNOSIS — J45909 Unspecified asthma, uncomplicated: Secondary | ICD-10-CM | POA: Diagnosis not present

## 2020-07-04 DIAGNOSIS — R42 Dizziness and giddiness: Secondary | ICD-10-CM | POA: Diagnosis not present

## 2020-07-04 DIAGNOSIS — E119 Type 2 diabetes mellitus without complications: Secondary | ICD-10-CM | POA: Insufficient documentation

## 2020-07-04 DIAGNOSIS — Z794 Long term (current) use of insulin: Secondary | ICD-10-CM | POA: Diagnosis not present

## 2020-07-04 DIAGNOSIS — I10 Essential (primary) hypertension: Secondary | ICD-10-CM | POA: Insufficient documentation

## 2020-07-04 DIAGNOSIS — Z743 Need for continuous supervision: Secondary | ICD-10-CM | POA: Diagnosis not present

## 2020-07-04 DIAGNOSIS — Z79899 Other long term (current) drug therapy: Secondary | ICD-10-CM | POA: Diagnosis not present

## 2020-07-04 DIAGNOSIS — R0602 Shortness of breath: Secondary | ICD-10-CM | POA: Diagnosis not present

## 2020-07-04 LAB — RESP PANEL BY RT-PCR (FLU A&B, COVID) ARPGX2
Influenza A by PCR: NEGATIVE
Influenza B by PCR: NEGATIVE
SARS Coronavirus 2 by RT PCR: POSITIVE — AB

## 2020-07-04 LAB — CBC WITH DIFFERENTIAL/PLATELET
Abs Immature Granulocytes: 0.01 10*3/uL (ref 0.00–0.07)
Basophils Absolute: 0 10*3/uL (ref 0.0–0.1)
Basophils Relative: 1 %
Eosinophils Absolute: 0 10*3/uL (ref 0.0–0.5)
Eosinophils Relative: 1 %
HCT: 42.8 % (ref 36.0–46.0)
Hemoglobin: 13.3 g/dL (ref 12.0–15.0)
Immature Granulocytes: 0 %
Lymphocytes Relative: 15 %
Lymphs Abs: 0.6 10*3/uL — ABNORMAL LOW (ref 0.7–4.0)
MCH: 27 pg (ref 26.0–34.0)
MCHC: 31.1 g/dL (ref 30.0–36.0)
MCV: 87 fL (ref 80.0–100.0)
Monocytes Absolute: 0.5 10*3/uL (ref 0.1–1.0)
Monocytes Relative: 13 %
Neutro Abs: 2.6 10*3/uL (ref 1.7–7.7)
Neutrophils Relative %: 70 %
Platelets: 143 10*3/uL — ABNORMAL LOW (ref 150–400)
RBC: 4.92 MIL/uL (ref 3.87–5.11)
RDW: 13.7 % (ref 11.5–15.5)
WBC: 3.7 10*3/uL — ABNORMAL LOW (ref 4.0–10.5)
nRBC: 0 % (ref 0.0–0.2)

## 2020-07-04 LAB — URINALYSIS, ROUTINE W REFLEX MICROSCOPIC
Bacteria, UA: NONE SEEN
Bilirubin Urine: NEGATIVE
Glucose, UA: >=500 mg/dL — AB
Ketones, ur: 20 mg/dL — AB
Leukocytes,Ua: NEGATIVE
Nitrite: NEGATIVE
Protein, ur: NEGATIVE mg/dL
Specific Gravity, Urine: 1.035 — ABNORMAL HIGH (ref 1.005–1.030)
pH: 5 (ref 5.0–8.0)

## 2020-07-04 LAB — BASIC METABOLIC PANEL
Anion gap: 11 (ref 5–15)
BUN: 10 mg/dL (ref 6–20)
CO2: 25 mmol/L (ref 22–32)
Calcium: 9 mg/dL (ref 8.9–10.3)
Chloride: 101 mmol/L (ref 98–111)
Creatinine, Ser: 0.58 mg/dL (ref 0.44–1.00)
GFR, Estimated: >60 mL/min (ref >60)
Glucose, Bld: 156 mg/dL — ABNORMAL HIGH (ref 70–99)
Potassium: 3.8 mmol/L (ref 3.5–5.1)
Sodium: 137 mmol/L (ref 135–145)

## 2020-07-04 LAB — ETHANOL: Alcohol, Ethyl (B): <10 mg/dL (ref <10)

## 2020-07-04 LAB — RAPID URINE DRUG SCREEN, HOSP PERFORMED
Amphetamines: NOT DETECTED
Barbiturates: NOT DETECTED
Benzodiazepines: NOT DETECTED
Cocaine: NOT DETECTED
Opiates: NOT DETECTED
Tetrahydrocannabinol: NOT DETECTED

## 2020-07-04 LAB — CBG MONITORING, ED: Glucose-Capillary: 145 mg/dL — ABNORMAL HIGH (ref 70–99)

## 2020-07-04 NOTE — Discharge Instructions (Addendum)
You were evaluated in the Emergency Department and after careful evaluation, we did not find any emergent condition requiring admission or further testing in the hospital.  Your COVID-19 test was positive today.  You will also need to quarantine for an additional 7 to 10 days.  Please take over-the-counter medicines such as Flonase, Zyrtec, Mucinex, TheraFlu etc.  Please return to the Emergency Department if you experience any worsening of your condition.  We encourage you to follow up with a primary care provider.  Thank you for allowing [place] to be a part of your care.

## 2020-07-04 NOTE — ED Provider Notes (Signed)
[facility] DEPT Provider Note   CSN: [id number] Arrival date & time: 07/04/20  3149     History No chief complaint on file.   Tammy Arellano is a 55 y.o. female.  HPI 55 year old female with a history of DM type II, seizures, mental retardation presents to the ER with complaints of weakness, dizziness, cough, nasal congestion, fatigue with occasional nausea and vomiting which is been ongoing for the last 2 days. No vomiting or abdominal pain today.   She is vaccinated for COVID-19 but not boosted.  No other sick contacts.  No chest pain or shortness of breath.    Past Medical History:  Diagnosis Date   Asthma    Diabetes mellitus    Dyslipidemia    Epileptic seizures ([place])    Family history of BRCA2 gene positive 06/03/2020   Family history of breast cancer 06/03/2020   Hypertension    Mental retardation    Obesity    Pseudoseizures ([place])     Patient Active Problem List   Diagnosis Date Noted   Genetic testing 06/27/2020   BRCA2 gene mutation positive in female 06/27/2020   Family history of breast cancer 06/03/2020   Family history of BRCA2 gene positive 06/03/2020   Hypoglycemia 09/18/2014   Dyslipidemia 09/18/2014   Overweight (BMI 25.0-29.9) 09/18/2014   Asthma, chronic 09/18/2014   GERD (gastroesophageal reflux disease) 09/18/2014   UTI (urinary tract infection) 09/18/2014   UTI (lower urinary tract infection) 09/18/2014   Mild cognitive disorder 09/13/2014   Type II diabetes mellitus, uncontrolled ([place]) 06/23/2014   Essential hypertension, benign 06/21/2014   Hypoglycemic reaction 06/19/2012   Uncontrolled diabetes mellitus ([place]) 08/25/2011   DM2 (diabetes mellitus, type 2) ([place]) 08/24/2011   Seizure disorder ([place]) 08/24/2011   Tonic clonic seizures ([place]) 08/24/2011   Pseudoseizures ([place])    Mental retardation     Past Surgical History:  Procedure Laterality Date   BREAST CYST EXCISION Left ~ [place]  Bilateral ~ 1988     OB History   No obstetric history on file.     Family History  Problem Relation Age of Onset   Osteoarthritis Mother    Diabetes Mother    Hypertension Mother    Clotting disorder Mother        blood disorder   Breast cancer Mother 36       triple negative; BRCA2 positive   Seizures Father    Lung cancer Father        d. 28; smoking hx   Heart attack Paternal Grandmother    Breast cancer Maternal Grandmother        dx after 87   Colon cancer Maternal Aunt 65   Prostate cancer Maternal Uncle        dx after 50   Kidney cancer Maternal Uncle        dx after 51    Social History   Tobacco Use   Smoking status: Never   Smokeless tobacco: Never  Vaping Use   Vaping Use: Never used  Substance Use Topics   Alcohol use: No   Drug use: No    Home Medications Prior to Admission medications   Medication Sig Start Date End Date Taking? Authorizing Provider  albuterol (PROVENTIL HFA;VENTOLIN HFA) 108 (90 BASE) MCG/ACT inhaler Inhale 2 puffs into the lungs daily.     [provider]  amLODipine (NORVASC) 5 MG tablet Take 5 mg by mouth daily.  09/05/14  [provider]  empagliflozin (JARDIANCE) 10 MG TABS tablet  11/01/17   [provider]  EPINEPHrine 0.3 mg/0.3 mL IJ SOAJ injection  10/05/17   [provider]  hydrALAZINE (APRESOLINE) 25 MG tablet Take 25 mg by mouth 2 (two) times daily. 05/05/17   [provider]  insulin aspart (NOVOLOG FLEXPEN) 100 UNIT/ML FlexPen Inject 0-9 Units into the skin 3 (three) times daily with meals. Sliding scale Patient not taking: Reported on 05/11/2017 09/16/14   [name], MD  insulin NPH Human (HUMULIN N,NOVOLIN N) 100 UNIT/ML injection Inject 45 Units into the skin daily before breakfast.     [provider]  irbesartan (AVAPRO) 300 MG tablet Take 300 mg by mouth daily.    [provider]  LAMICTAL 200 MG tablet TAKE 1 TABLET BY MOUTH TWICE DAILY 11/29/17    Cameron Sprang, MD  levETIRAcetam (KEPPRA) 1000 MG tablet Take one and one half tablets each morning and take two tablets each evening 01/31/17   Cameron Sprang, MD  montelukast (SINGULAIR) 10 MG tablet Take 10 mg by mouth at bedtime.    [provider]  omeprazole (PRILOSEC) 20 MG capsule Take 20 mg by mouth 2 (two) times daily before a meal.     [provider]  pioglitazone (ACTOS) 15 MG tablet Take 15 mg by mouth daily. 05/05/17   [provider]  pravastatin (PRAVACHOL) 10 MG tablet Take 5 mg by mouth daily. 05/05/17   [provider]  ramipril (ALTACE) 5 MG capsule TAKE 1 CAPSULE BY MOUTH EVERY DAY. Patient taking differently: TAKE 1 CAPSULE BY MOUTH EVERY DAY IN [facility] 10/31/14   [name], MD  VICTOZA 18 MG/3ML SOPN INJECT 0.$RemoveBefor'6MG'LBGUWiHdkcQy$  INTO THE SKIN ONCE DAILY. Patient not taking: Reported on 05/11/2017 06/13/15   [name], MD  vitamin E 400 UNIT capsule Take 400 Units by mouth daily.    [provider]    Allergies    Depakote [divalproex sodium], Dilantin [phenytoin sodium extended], Furosemide, Penicillins, Phenytoin sodium extended, Shellfish allergy, Lip balm [neutrogena lip moisturizer], and Latex  Review of Systems   Review of Systems  Constitutional:  Positive for activity change, appetite change, chills, fatigue and fever.  Respiratory:  Positive for cough. Negative for shortness of breath.   Cardiovascular:  Negative for chest pain.  Gastrointestinal:  Positive for nausea and vomiting. Negative for abdominal pain and diarrhea.  Genitourinary:  Negative for dysuria.  Neurological:  Positive for weakness.   Physical Exam Updated Vital Signs BP 127/82   Pulse (!) 103   Temp 98.8 F (37.1 C) (Oral)   Resp 18   Ht $R'5\' 3"'uB$  (1.6 m)   Wt 78.5 kg   SpO2 99%   BMI 30.65 kg/m   Physical Exam Vitals reviewed.  Constitutional:      Appearance: Normal appearance.  HENT:     Head: Normocephalic and atraumatic.  Eyes:     General:         Right eye: No discharge.        Left eye: No discharge.     Extraocular Movements: Extraocular movements intact.     Conjunctiva/sclera: Conjunctivae normal.  Cardiovascular:     Pulses: Normal pulses.     Heart sounds: Normal heart sounds.  Pulmonary:     Effort: Pulmonary effort is normal.     Breath sounds: Normal breath sounds.  Abdominal:     General: Abdomen is flat.     Tenderness: There is  no abdominal tenderness.  Musculoskeletal:        General: No swelling. Normal range of motion.  Neurological:     General: No focal deficit present.     Mental Status: She is alert and oriented to person, place, and time.     Sensory: No sensory deficit.     Motor: No weakness.     Comments: Mental Status:  Alert, thought content appropriate, able to give a coherent history. Speech fluent without evidence of aphasia. Able to follow 2 step commands without difficulty.  Cranial Nerves:  II:  Peripheral visual fields grossly normal, pupils equal, round, reactive to light III,IV, VI: ptosis not present, extra-ocular motions intact bilaterally  V,VII: smile symmetric, facial light touch sensation equal VIII: hearing grossly normal to voice  X: uvula elevates symmetrically  XI: bilateral shoulder shrug symmetric and strong XII: midline tongue extension without fassiculations Motor:  Normal tone. 5/5 strength of BUE and BLE major muscle groups including strong and equal grip strength and dorsiflexion/plantar flexion Sensory: light touch normal in all extremities. Cerebellar: normal finger-to-nose with bilateral upper extremities, Romberg sign absent Gait:     Psychiatric:        Mood and Affect: Mood normal.        Behavior: Behavior normal.    ED Results / Procedures / Treatments   Labs (all labs ordered are listed, but only abnormal results are displayed) Labs Reviewed  RESP PANEL BY RT-PCR (FLU A&B, COVID) ARPGX2 - Abnormal; Notable for the following components:      Result  Value   SARS Coronavirus 2 by RT PCR POSITIVE (*)    All other components within normal limits  CBC WITH DIFFERENTIAL/PLATELET - Abnormal; Notable for the following components:   WBC 3.7 (*)    Platelets 143 (*)    Lymphs Abs 0.6 (*)    All other components within normal limits  BASIC METABOLIC PANEL - Abnormal; Notable for the following components:   Glucose, Bld 156 (*)    All other components within normal limits  URINALYSIS, ROUTINE W REFLEX MICROSCOPIC - Abnormal; Notable for the following components:   Specific Gravity, Urine 1.035 (*)    Glucose, UA >=500 (*)    Hgb urine dipstick SMALL (*)    Ketones, ur 20 (*)    All other components within normal limits  CBG MONITORING, ED - Abnormal; Notable for the following components:   Glucose-Capillary 145 (*)    All other components within normal limits  ETHANOL  RAPID URINE DRUG SCREEN, HOSP PERFORMED    EKG None  Radiology DG Chest Portable 1 View  Result Date: 07/04/2020 CLINICAL DATA:  Cough and shortness of breath. Dizziness with loss of taste. EXAM: PORTABLE CHEST 1 VIEW COMPARISON:  Radiographs 09/18/2014. FINDINGS: 1123 hours. The heart size and mediastinal contours are normal. The lungs are clear. There is no pleural effusion or pneumothorax. No acute osseous findings are identified. IMPRESSION: No active cardiopulmonary process. Electronically Signed   By: Richardean Sale M.D.   On: 07/04/2020 11:59    Procedures Procedures   Medications Ordered in ED Medications - No data to display  ED Course  I have reviewed the triage vital signs and the nursing notes.  Pertinent labs & imaging results that were available during my care of the patient were reviewed by me and considered in my medical decision making (see chart for details).    MDM Rules/Calculators/A&P  55 year old female presents to the ER with complaints of URI type symptoms including nasal congestion, cough, nausea, vomiting,  dizziness and weakness.  On arrival, she is well-appearing, no acute distress, resting comfortably in the ER bed.  Vitals overall reassuring, afebrile, no evidence of hypoxia.  Physical exam reassuring, abdomen soft and nontender, no CVA tenderness.  Her neuro exam is normal.  Labs and imaging ordered to rule out any electrode abnormalities as an explanation for her symptoms.  CBC with a leukopenia 3.7, appears to be at baseline.  BMP without any significant lecture abnormalities.  Ethanol is negative.  COVID pending.  Chest x-ray without acute pneumonia.  Static vitals normal.  Low suspicion for acute stroke.  Suspect viral illness.  UA and UDS unremarkable.  COVID+. No evidence of hypoxia.  Patient encouraged to follow-up on the results via the MyChart app.  We discussed PCP follow-up and return precautions.  Discussed supportive care.  She voiced understanding and is agreeable.  Stable for discharge.   Final Clinical Impression(s) / ED Diagnoses Final diagnoses:  [id number]    Rx / DC Orders ED Discharge Orders     None        Lyndel Safe 07/04/20 1351    Truddie Hidden, MD 07/07/20 1507

## 2020-07-04 NOTE — ED Notes (Addendum)
Patient ambulatory to restroom with one staff assistance.

## 2020-07-04 NOTE — ED Notes (Signed)
With patient permission, d/c paperwork reviewed with sister via telephone. Patient to wait in room for sister to provide transportation due to MR history. Sister reports she will call patient upon arrival to be escorted to car.

## 2020-07-07 DIAGNOSIS — Z1152 Encounter for screening for COVID-19: Secondary | ICD-10-CM | POA: Diagnosis not present

## 2020-07-15 DIAGNOSIS — Z1152 Encounter for screening for COVID-19: Secondary | ICD-10-CM | POA: Diagnosis not present

## 2020-07-19 DIAGNOSIS — Z1152 Encounter for screening for COVID-19: Secondary | ICD-10-CM | POA: Diagnosis not present

## 2020-07-21 DIAGNOSIS — Z1152 Encounter for screening for COVID-19: Secondary | ICD-10-CM | POA: Diagnosis not present

## 2020-07-25 DIAGNOSIS — Z1152 Encounter for screening for COVID-19: Secondary | ICD-10-CM | POA: Diagnosis not present

## 2020-07-30 DIAGNOSIS — Z1152 Encounter for screening for COVID-19: Secondary | ICD-10-CM | POA: Diagnosis not present

## 2020-08-04 DIAGNOSIS — Z1152 Encounter for screening for COVID-19: Secondary | ICD-10-CM | POA: Diagnosis not present

## 2020-08-08 DIAGNOSIS — Z1152 Encounter for screening for COVID-19: Secondary | ICD-10-CM | POA: Diagnosis not present

## 2020-08-14 ENCOUNTER — Other Ambulatory Visit: Payer: Self-pay

## 2020-08-14 ENCOUNTER — Inpatient Hospital Stay: Payer: Medicare Other | Attending: Neurology | Admitting: Hematology

## 2020-08-14 ENCOUNTER — Encounter: Payer: Self-pay | Admitting: Hematology

## 2020-08-14 VITALS — BP 140/86 | HR 102 | Temp 98.1°F | Resp 18 | Ht 63.0 in | Wt 167.4 lb

## 2020-08-14 DIAGNOSIS — Z800 Family history of malignant neoplasm of digestive organs: Secondary | ICD-10-CM | POA: Insufficient documentation

## 2020-08-14 DIAGNOSIS — Z803 Family history of malignant neoplasm of breast: Secondary | ICD-10-CM | POA: Insufficient documentation

## 2020-08-14 DIAGNOSIS — Z1509 Genetic susceptibility to other malignant neoplasm: Secondary | ICD-10-CM | POA: Insufficient documentation

## 2020-08-14 DIAGNOSIS — Z1502 Genetic susceptibility to malignant neoplasm of ovary: Secondary | ICD-10-CM | POA: Diagnosis not present

## 2020-08-14 DIAGNOSIS — Z8051 Family history of malignant neoplasm of kidney: Secondary | ICD-10-CM | POA: Insufficient documentation

## 2020-08-14 DIAGNOSIS — Z1501 Genetic susceptibility to malignant neoplasm of breast: Secondary | ICD-10-CM | POA: Diagnosis not present

## 2020-08-14 DIAGNOSIS — Z801 Family history of malignant neoplasm of trachea, bronchus and lung: Secondary | ICD-10-CM | POA: Insufficient documentation

## 2020-08-14 NOTE — Progress Notes (Signed)
[place]   [phone] [phone] [place] Note   Patient Care Team: Janie Morning, DO as PCP - General (Family Medicine) [name], MD as PCP - Internal Medicine (Neurology) 08/14/2020  CHIEF COMPLAINTS/PURPOSE OF CONSULTATION:  BRCA2 mutation (+)   HISTORY OF PRESENTING ILLNESS:  Tammy Arellano 55 y.o. female is here because of  recently discovered BRCA2 mutation.  She is here with her sister Tito Dine.  Of note, their mother is also my patient who had breast cancer.  Due to her positive family history, she underwent genetic testing in May 2022, which came back positive for a single, heterozygous pathologic genetic mutation in BRCA2 [id number] ([id number]).  She was referred to my clinic to discuss cancer risk reduction.  She had no history of any cancer.  She has medical history significant for diabetes, hypertension, allergies, asthma, seizure disorder, and mild mental retardation.  She lives alone, is able to function by herself.  She has been doing mammograms yearly most time, last one in 2019, she is scheduled for mammogram on September 23, 2020.  Her previous mammograms have been normal.  She feels well today, denies any pain or other symptoms.  GYN HISTORY  Menarchal: 57 LMP: 53 Contraceptive:no  HRT: No  G0P0:   MEDICAL HISTORY:  Past Medical History:  Diagnosis Date   Asthma    Diabetes mellitus    Dyslipidemia    Epileptic seizures ([place])    Family history of BRCA2 gene positive 06/03/2020   Family history of breast cancer 06/03/2020   Hypertension    Mental retardation    Obesity    Pseudoseizures ([place])     SURGICAL HISTORY: Past Surgical History:  Procedure Laterality Date   BREAST CYST EXCISION Left ~ [place] Bilateral ~ 1988    SOCIAL HISTORY: Social History   Socioeconomic History   Marital status: Single    Spouse name: Not on file   Number of children: 0   Years of  education: 12   Highest education level: Not on file  Occupational History   Not on file  Tobacco Use   Smoking status: Never   Smokeless tobacco: Never  Vaping Use   Vaping Use: Never used  Substance and Sexual Activity   Alcohol use: No   Drug use: No   Sexual activity: Never  Other Topics Concern   Not on file  Social History Narrative   Patient is single with no children and resides with her mother.   Patient is left handed      Social Determinants of Health   Financial Resource Strain: Not on file  Food Insecurity: Not on file  Transportation Needs: Not on file  Physical Activity: Not on file  Stress: Not on file  Social Connections: Not on file  Intimate Partner Violence: Not on file    FAMILY HISTORY: Family History  Problem Relation Age of Onset   Osteoarthritis Mother    Diabetes Mother    Hypertension Mother    Clotting disorder Mother        blood disorder   Breast cancer Mother 1       triple negative; BRCA2 positive   Seizures Father    Lung cancer Father        d. 29; smoking hx   Heart attack Paternal Grandmother    Breast cancer Maternal Grandmother        dx after 50   Colon  cancer Maternal Aunt 65   Prostate cancer Maternal Uncle        dx after 50   Kidney cancer Maternal Uncle        dx after 50    ALLERGIES:  is allergic to depakote [divalproex sodium], dilantin [phenytoin sodium extended], furosemide, penicillins, phenytoin sodium extended, shellfish allergy, lip balm [neutrogena lip moisturizer], and latex.  MEDICATIONS:  Current Outpatient Medications  Medication Sig Dispense Refill   albuterol (PROVENTIL HFA;VENTOLIN HFA) 108 (90 BASE) MCG/ACT inhaler Inhale 2 puffs into the lungs daily.      amLODipine (NORVASC) 5 MG tablet Take 5 mg by mouth daily.      empagliflozin (JARDIANCE) 10 MG TABS tablet      EPINEPHrine 0.3 mg/0.3 mL IJ SOAJ injection      hydrALAZINE (APRESOLINE) 25 MG tablet Take 25 mg by mouth 2 (two) times daily.      insulin aspart (NOVOLOG FLEXPEN) 100 UNIT/ML FlexPen Inject 0-9 Units into the skin 3 (three) times daily with meals. Sliding scale (Patient not taking: Reported on 05/11/2017) 15 mL 1   insulin NPH Human (HUMULIN N,NOVOLIN N) 100 UNIT/ML injection Inject 45 Units into the skin daily before breakfast.      irbesartan (AVAPRO) 300 MG tablet Take 300 mg by mouth daily.     LAMICTAL 200 MG tablet TAKE 1 TABLET BY MOUTH TWICE DAILY 180 tablet 0   levETIRAcetam (KEPPRA) 1000 MG tablet Take one and one half tablets each morning and take two tablets each evening 315 tablet 3   montelukast (SINGULAIR) 10 MG tablet Take 10 mg by mouth at bedtime.     omeprazole (PRILOSEC) 20 MG capsule Take 20 mg by mouth 2 (two) times daily before a meal.      pioglitazone (ACTOS) 15 MG tablet Take 15 mg by mouth daily.     pravastatin (PRAVACHOL) 10 MG tablet Take 5 mg by mouth daily.     ramipril (ALTACE) 5 MG capsule TAKE 1 CAPSULE BY MOUTH EVERY DAY. (Patient taking differently: TAKE 1 CAPSULE BY MOUTH EVERY DAY IN MORNING) 30 capsule 3   VICTOZA 18 MG/3ML SOPN INJECT 0.6MG INTO THE SKIN ONCE DAILY. (Patient not taking: Reported on 05/11/2017) 3 mL 0   vitamin E 400 UNIT capsule Take 400 Units by mouth daily.     No current facility-administered medications for this visit.    REVIEW OF SYSTEMS:   Constitutional: Denies fevers, chills or abnormal night sweats Eyes: Denies blurriness of vision, double vision or watery eyes Ears, nose, mouth, throat, and face: Denies mucositis or sore throat Respiratory: Denies cough, dyspnea or wheezes Cardiovascular: Denies palpitation, chest discomfort or lower extremity swelling Gastrointestinal:  Denies nausea, heartburn or change in bowel habits Skin: Denies abnormal skin rashes Lymphatics: Denies new lymphadenopathy or easy bruising Neurological:Denies numbness, tingling or new weaknesses Behavioral/Psych: Mood is stable, no new changes  All other systems were reviewed  with the patient and are negative.  PHYSICAL EXAMINATION: ECOG PERFORMANCE STATUS: 0 - Asymptomatic  Vitals:   08/14/20 0914  BP: 140/86  Pulse: (!) 102  Resp: 18  Temp: 98.1 F (36.7 C)  SpO2: 98%   Filed Weights   08/14/20 0914  Weight: 167 lb 6.4 oz (75.9 kg)    GENERAL:alert, no distress and comfortable SKIN: skin color, texture, turgor are normal, no rashes or significant lesions EYES: normal, conjunctiva are pink and non-injected, sclera clear OROPHARYNX:no exudate, no erythema and lips, buccal mucosa, and tongue normal  NECK:  supple, thyroid normal size, non-tender, without nodularity LYMPH:  no palpable lymphadenopathy in the cervical, axillary or inguinal LUNGS: clear to auscultation and percussion with normal breathing effort HEART: regular rate & rhythm and no murmurs and no lower extremity edema ABDOMEN:abdomen soft, non-tender and normal bowel sounds Musculoskeletal:no cyanosis of digits and no clubbing  PSYCH: alert & oriented x 3 with fluent speech NEURO: no focal motor/sensory deficits Breasts: Breast inspection showed them to be symmetrical with no nipple discharge. Palpation of the breasts and axilla revealed no obvious mass that I could appreciate.  Surgical incisions from previous breast reduction have healed very well.   LABORATORY DATA:  I have reviewed the data as listed CBC Latest Ref Rng & Units 07/04/2020 04/22/2016 04/18/2016  WBC 4.0 - 10.5 K/uL 3.7(L) 3.8(L) -  Hemoglobin 12.0 - 15.0 g/dL 13.3 12.4 13.9  Hematocrit 36.0 - 46.0 % 42.8 39.2 41.0  Platelets 150 - 400 K/uL 143(L) 172 -   CMP Latest Ref Rng & Units 07/04/2020 04/22/2016 04/18/2016  Glucose 70 - 99 mg/dL 156(H) 62(L) 460(H)  BUN 6 - 20 mg/dL _0 Creatinine 0.44 - 1.00 mg/dL 0.58 0.45 0.60  Sodium 135 - 145 mmol/L 137 137 136  Potassium 3.5 - 5.1 mmol/L 3.8 3.7 4.4  Chloride 98 - 111 mmol/L 101 100(L) 97(L)  CO2 22 - 32 mmol/L 25 28 -  Calcium 8.9 - 10.3 mg/dL 9.0 9.0 -  Total  Protein 6.5 - 8.1 g/dL - 7.4 -  Total Bilirubin 0.3 - 1.2 mg/dL - 0.6 -  Alkaline Phos 38 - 126 U/L - 56 -  AST 15 - 41 U/L - 22 -  ALT 14 - 54 U/L - 11(L) -     RADIOGRAPHIC STUDIES: I have personally reviewed the radiological images as listed and agreed with the findings in the report. No results found.  ASSESSMENT & PLAN:  55 yo female   1. BRCA2 mutation carrier  -We discussed extensively about the risks of breast cancer (45-60% by age of 76) and ovarian cancer (11-18% by age of 51), and the risks of other malignancies, including pancreatic cancer, colorectal cancer, endometrial cancer, skin cancer, etc.  -For breast cancer, I recommend screening with annual mammogram and breast MRI, 6 months apart.  I also encouraged her to do self breast exam, and a clinical breast exam by a physician twice a year. -We discussed healthy lifestyle to reduce her risk of breast cancer, which includes but not limited to, healthy diet, with more fresh fruit, vegetables, less red meat, more lean meat, regular exercise 4-5 times a week, such as brisk walking 30 to 40 minutes each time.  We also discussed being positive, and meditation etc.  -I discussed the role of prophylactic double mastectomy, with or without breast reconstruction.  Due to her medical comorbidities, especially diabetes, mental retardation, she is not sure if she wants to have surgery.  But she is open to discuss with breast surgeon, I will refer her to Dr. Donne Hazel, who did her mother's breast cancer surgery. -We also discussed chemoprevention with use of tamoxifen 20 mg daily for 5 years to help prevent breast cancer risk if she decides not bilateral mastectomy.  The side effects of tamoxifen, which includes but done not limited to, hot flashes, vaginal dryness, small risk of thrombosis, small risk of endometrial cancer (does not apply to her due to hysterectomy), were discussed with her in great details. Written material off tamoxifen was  given to  her today. She understands the risks and benefits of tamoxifen. She will think about it and let me know  -For ovarian cancer risk, I encouraged her to consider BSO, which is laparoscopic small surgery.  She will think about it. -She has no family history of pancreatic cancer, does not meet criteria for imaging screening, such as abdominal MRI or EUS.  We discussed pancreatic cancer related clinical symptoms, and I think it is reasonable to check tumor marker CA 19.9 every 6 to 12 months with her primary care physician.   Plan: -Surgical referral to Dr. Donne Hazel discuss prophylactic mastectomy -She will call me if she is interested in tamoxifen -Continue annual mammogram and MRI screening -will keep her f/u open for now, I will be happy to see her back in a year if she prefers   No orders of the defined types were placed in this encounter.   All questions were answered. The patient knows to call the clinic with any problems, questions or concerns. I spent 40 minutes counseling the patient face to face. The total time spent in the appointment was 45 minutes and more than 50% was on counseling.     Truitt Merle, MD 08/14/2020 9:25 AM

## 2020-08-15 DIAGNOSIS — Z1152 Encounter for screening for COVID-19: Secondary | ICD-10-CM | POA: Diagnosis not present

## 2020-08-19 DIAGNOSIS — Z1152 Encounter for screening for COVID-19: Secondary | ICD-10-CM | POA: Diagnosis not present

## 2020-08-22 DIAGNOSIS — Z1152 Encounter for screening for COVID-19: Secondary | ICD-10-CM | POA: Diagnosis not present

## 2020-08-25 DIAGNOSIS — Z1152 Encounter for screening for COVID-19: Secondary | ICD-10-CM | POA: Diagnosis not present

## 2020-08-27 ENCOUNTER — Inpatient Hospital Stay: Admission: RE | Admit: 2020-08-27 | Payer: Medicare Other | Source: Ambulatory Visit

## 2020-09-01 DIAGNOSIS — Z1152 Encounter for screening for COVID-19: Secondary | ICD-10-CM | POA: Diagnosis not present

## 2020-09-04 DIAGNOSIS — I10 Essential (primary) hypertension: Secondary | ICD-10-CM | POA: Diagnosis not present

## 2020-09-04 DIAGNOSIS — J45909 Unspecified asthma, uncomplicated: Secondary | ICD-10-CM | POA: Diagnosis not present

## 2020-09-04 DIAGNOSIS — G40909 Epilepsy, unspecified, not intractable, without status epilepticus: Secondary | ICD-10-CM | POA: Diagnosis not present

## 2020-09-04 DIAGNOSIS — R69 Illness, unspecified: Secondary | ICD-10-CM | POA: Diagnosis not present

## 2020-09-04 DIAGNOSIS — E11319 Type 2 diabetes mellitus with unspecified diabetic retinopathy without macular edema: Secondary | ICD-10-CM | POA: Diagnosis not present

## 2020-09-04 DIAGNOSIS — D649 Anemia, unspecified: Secondary | ICD-10-CM | POA: Diagnosis not present

## 2020-09-04 DIAGNOSIS — E1165 Type 2 diabetes mellitus with hyperglycemia: Secondary | ICD-10-CM | POA: Diagnosis not present

## 2020-09-04 DIAGNOSIS — R32 Unspecified urinary incontinence: Secondary | ICD-10-CM | POA: Diagnosis not present

## 2020-09-04 DIAGNOSIS — E785 Hyperlipidemia, unspecified: Secondary | ICD-10-CM | POA: Diagnosis not present

## 2020-09-04 DIAGNOSIS — M179 Osteoarthritis of knee, unspecified: Secondary | ICD-10-CM | POA: Diagnosis not present

## 2020-09-05 DIAGNOSIS — Z1152 Encounter for screening for COVID-19: Secondary | ICD-10-CM | POA: Diagnosis not present

## 2020-09-11 DIAGNOSIS — Z1152 Encounter for screening for COVID-19: Secondary | ICD-10-CM | POA: Diagnosis not present

## 2020-09-12 DIAGNOSIS — Z1152 Encounter for screening for COVID-19: Secondary | ICD-10-CM | POA: Diagnosis not present

## 2020-09-15 DIAGNOSIS — Z1152 Encounter for screening for COVID-19: Secondary | ICD-10-CM | POA: Diagnosis not present

## 2020-09-15 DIAGNOSIS — R32 Unspecified urinary incontinence: Secondary | ICD-10-CM | POA: Diagnosis not present

## 2020-09-22 DIAGNOSIS — Z20822 Contact with and (suspected) exposure to covid-19: Secondary | ICD-10-CM | POA: Diagnosis not present

## 2020-09-23 DIAGNOSIS — Z1152 Encounter for screening for COVID-19: Secondary | ICD-10-CM | POA: Diagnosis not present

## 2020-10-06 DIAGNOSIS — Z1152 Encounter for screening for COVID-19: Secondary | ICD-10-CM | POA: Diagnosis not present

## 2020-10-10 DIAGNOSIS — Z23 Encounter for immunization: Secondary | ICD-10-CM | POA: Diagnosis not present

## 2020-10-14 DIAGNOSIS — Z1152 Encounter for screening for COVID-19: Secondary | ICD-10-CM | POA: Diagnosis not present

## 2020-10-16 DIAGNOSIS — E1142 Type 2 diabetes mellitus with diabetic polyneuropathy: Secondary | ICD-10-CM | POA: Diagnosis not present

## 2020-10-16 DIAGNOSIS — E785 Hyperlipidemia, unspecified: Secondary | ICD-10-CM | POA: Diagnosis not present

## 2020-10-16 DIAGNOSIS — D649 Anemia, unspecified: Secondary | ICD-10-CM | POA: Diagnosis not present

## 2020-10-16 DIAGNOSIS — K219 Gastro-esophageal reflux disease without esophagitis: Secondary | ICD-10-CM | POA: Diagnosis not present

## 2020-10-16 DIAGNOSIS — G40909 Epilepsy, unspecified, not intractable, without status epilepticus: Secondary | ICD-10-CM | POA: Diagnosis not present

## 2020-10-16 DIAGNOSIS — I10 Essential (primary) hypertension: Secondary | ICD-10-CM | POA: Diagnosis not present

## 2020-10-16 DIAGNOSIS — E669 Obesity, unspecified: Secondary | ICD-10-CM | POA: Diagnosis not present

## 2020-10-16 DIAGNOSIS — J45909 Unspecified asthma, uncomplicated: Secondary | ICD-10-CM | POA: Diagnosis not present

## 2020-10-16 DIAGNOSIS — K08409 Partial loss of teeth, unspecified cause, unspecified class: Secondary | ICD-10-CM | POA: Diagnosis not present

## 2020-10-16 DIAGNOSIS — Z794 Long term (current) use of insulin: Secondary | ICD-10-CM | POA: Diagnosis not present

## 2020-10-22 DIAGNOSIS — Z1152 Encounter for screening for COVID-19: Secondary | ICD-10-CM | POA: Diagnosis not present

## 2020-10-28 DIAGNOSIS — Z1152 Encounter for screening for COVID-19: Secondary | ICD-10-CM | POA: Diagnosis not present

## 2020-11-01 DIAGNOSIS — Z1152 Encounter for screening for COVID-19: Secondary | ICD-10-CM | POA: Diagnosis not present

## 2020-12-23 DIAGNOSIS — Z1152 Encounter for screening for COVID-19: Secondary | ICD-10-CM | POA: Diagnosis not present

## 2021-01-01 DIAGNOSIS — E1165 Type 2 diabetes mellitus with hyperglycemia: Secondary | ICD-10-CM | POA: Diagnosis not present

## 2021-01-01 DIAGNOSIS — E785 Hyperlipidemia, unspecified: Secondary | ICD-10-CM | POA: Diagnosis not present

## 2021-01-01 DIAGNOSIS — D649 Anemia, unspecified: Secondary | ICD-10-CM | POA: Diagnosis not present

## 2021-01-02 DIAGNOSIS — I10 Essential (primary) hypertension: Secondary | ICD-10-CM | POA: Diagnosis not present

## 2021-01-02 DIAGNOSIS — E11319 Type 2 diabetes mellitus with unspecified diabetic retinopathy without macular edema: Secondary | ICD-10-CM | POA: Diagnosis not present

## 2021-01-15 DIAGNOSIS — Z1152 Encounter for screening for COVID-19: Secondary | ICD-10-CM | POA: Diagnosis not present

## 2021-03-16 DIAGNOSIS — Z20822 Contact with and (suspected) exposure to covid-19: Secondary | ICD-10-CM | POA: Diagnosis not present

## 2021-03-24 DIAGNOSIS — H25813 Combined forms of age-related cataract, bilateral: Secondary | ICD-10-CM | POA: Diagnosis not present

## 2021-03-24 DIAGNOSIS — E1136 Type 2 diabetes mellitus with diabetic cataract: Secondary | ICD-10-CM | POA: Diagnosis not present

## 2021-03-24 DIAGNOSIS — Z7984 Long term (current) use of oral hypoglycemic drugs: Secondary | ICD-10-CM | POA: Diagnosis not present

## 2021-03-24 DIAGNOSIS — Z794 Long term (current) use of insulin: Secondary | ICD-10-CM | POA: Diagnosis not present

## 2021-04-02 DIAGNOSIS — I10 Essential (primary) hypertension: Secondary | ICD-10-CM | POA: Diagnosis not present

## 2021-04-02 DIAGNOSIS — E785 Hyperlipidemia, unspecified: Secondary | ICD-10-CM | POA: Diagnosis not present

## 2021-04-02 DIAGNOSIS — E11319 Type 2 diabetes mellitus with unspecified diabetic retinopathy without macular edema: Secondary | ICD-10-CM | POA: Diagnosis not present

## 2021-04-26 DIAGNOSIS — Z1152 Encounter for screening for COVID-19: Secondary | ICD-10-CM | POA: Diagnosis not present

## 2021-05-01 DIAGNOSIS — Z1152 Encounter for screening for COVID-19: Secondary | ICD-10-CM | POA: Diagnosis not present

## 2021-05-10 DIAGNOSIS — Z1152 Encounter for screening for COVID-19: Secondary | ICD-10-CM | POA: Diagnosis not present

## 2021-05-15 ENCOUNTER — Other Ambulatory Visit: Payer: Self-pay | Admitting: Family Medicine

## 2021-05-15 DIAGNOSIS — Z1152 Encounter for screening for COVID-19: Secondary | ICD-10-CM | POA: Diagnosis not present

## 2021-05-15 DIAGNOSIS — Z1231 Encounter for screening mammogram for malignant neoplasm of breast: Secondary | ICD-10-CM

## 2021-05-22 DIAGNOSIS — Z1152 Encounter for screening for COVID-19: Secondary | ICD-10-CM | POA: Diagnosis not present

## 2021-05-27 DIAGNOSIS — Z23 Encounter for immunization: Secondary | ICD-10-CM | POA: Diagnosis not present

## 2021-05-27 DIAGNOSIS — Z0000 Encounter for general adult medical examination without abnormal findings: Secondary | ICD-10-CM | POA: Diagnosis not present

## 2021-05-27 DIAGNOSIS — E1140 Type 2 diabetes mellitus with diabetic neuropathy, unspecified: Secondary | ICD-10-CM | POA: Diagnosis not present

## 2021-05-27 DIAGNOSIS — E11319 Type 2 diabetes mellitus with unspecified diabetic retinopathy without macular edema: Secondary | ICD-10-CM | POA: Diagnosis not present

## 2021-05-29 ENCOUNTER — Ambulatory Visit
Admission: RE | Admit: 2021-05-29 | Discharge: 2021-05-29 | Disposition: A | Discharge status: Home / Self Care | Payer: Medicare Other | Source: Ambulatory Visit | Attending: Family Medicine | Admitting: Family Medicine

## 2021-05-29 DIAGNOSIS — Z1231 Encounter for screening mammogram for malignant neoplasm of breast: Secondary | ICD-10-CM | POA: Diagnosis not present

## 2021-05-29 DIAGNOSIS — Z1152 Encounter for screening for COVID-19: Secondary | ICD-10-CM | POA: Diagnosis not present

## 2021-06-07 DIAGNOSIS — Z1152 Encounter for screening for COVID-19: Secondary | ICD-10-CM | POA: Diagnosis not present

## 2021-06-13 DIAGNOSIS — Z1152 Encounter for screening for COVID-19: Secondary | ICD-10-CM | POA: Diagnosis not present

## 2021-06-19 DIAGNOSIS — Z1152 Encounter for screening for COVID-19: Secondary | ICD-10-CM | POA: Diagnosis not present

## 2021-07-15 DIAGNOSIS — H6123 Impacted cerumen, bilateral: Secondary | ICD-10-CM | POA: Diagnosis not present

## 2021-07-31 DIAGNOSIS — Z1152 Encounter for screening for COVID-19: Secondary | ICD-10-CM | POA: Diagnosis not present

## 2021-07-31 DIAGNOSIS — E11319 Type 2 diabetes mellitus with unspecified diabetic retinopathy without macular edema: Secondary | ICD-10-CM | POA: Diagnosis not present

## 2021-08-06 DIAGNOSIS — Z1152 Encounter for screening for COVID-19: Secondary | ICD-10-CM | POA: Diagnosis not present

## 2021-08-28 DIAGNOSIS — Z1152 Encounter for screening for COVID-19: Secondary | ICD-10-CM | POA: Diagnosis not present

## 2021-08-29 DIAGNOSIS — Z1152 Encounter for screening for COVID-19: Secondary | ICD-10-CM | POA: Diagnosis not present

## 2021-08-30 DIAGNOSIS — E11319 Type 2 diabetes mellitus with unspecified diabetic retinopathy without macular edema: Secondary | ICD-10-CM | POA: Diagnosis not present

## 2021-09-04 DIAGNOSIS — E11319 Type 2 diabetes mellitus with unspecified diabetic retinopathy without macular edema: Secondary | ICD-10-CM | POA: Diagnosis not present

## 2021-09-04 DIAGNOSIS — Z1152 Encounter for screening for COVID-19: Secondary | ICD-10-CM | POA: Diagnosis not present

## 2021-09-04 DIAGNOSIS — E1140 Type 2 diabetes mellitus with diabetic neuropathy, unspecified: Secondary | ICD-10-CM | POA: Diagnosis not present

## 2021-09-09 DIAGNOSIS — E1140 Type 2 diabetes mellitus with diabetic neuropathy, unspecified: Secondary | ICD-10-CM | POA: Diagnosis not present

## 2021-09-09 DIAGNOSIS — E11319 Type 2 diabetes mellitus with unspecified diabetic retinopathy without macular edema: Secondary | ICD-10-CM | POA: Diagnosis not present

## 2021-09-22 DIAGNOSIS — Z1152 Encounter for screening for COVID-19: Secondary | ICD-10-CM | POA: Diagnosis not present

## 2021-09-28 DIAGNOSIS — Z1152 Encounter for screening for COVID-19: Secondary | ICD-10-CM | POA: Diagnosis not present

## 2021-09-29 ENCOUNTER — Telehealth: Payer: Self-pay

## 2021-09-29 DIAGNOSIS — E11319 Type 2 diabetes mellitus with unspecified diabetic retinopathy without macular edema: Secondary | ICD-10-CM | POA: Diagnosis not present

## 2021-09-29 NOTE — Patient Outreach (Signed)
  Care Coordination   09/29/2021 Name: Tammy Arellano MRN: [id number] DOB: May 24, 1965   Care Coordination Outreach Attempts:  An unsuccessful telephone outreach was attempted today to offer the patient information about available care coordination services as a benefit of their health plan.   Follow Up Plan:  Additional outreach attempts will be made to offer the patient care coordination information and services.   Encounter Outcome:  No Answer  Care Coordination Interventions Activated:  No   Care Coordination Interventions:  No, not indicated    [name], RN, MSN [facility] Care Management Care Management Coordinator Direct Line [phone] Toll Free: [phone]  Fax: [phone]

## 2021-10-06 ENCOUNTER — Telehealth: Payer: Self-pay

## 2021-10-06 NOTE — Patient Instructions (Signed)
Visit Information  Thank you for taking time to visit with me today. Please don't hesitate to contact me if I can be of assistance to you.   Following are the goals we discussed today:   Goals Addressed             This Visit's Progress    COMPLETED: Care coordination activities-no follow up required       Care Coordination Interventions: Advised patient to schedule annual wellness visit. Patient to call office              If you are experiencing a Mental Health or Behavioral Health Crisis or need someone to talk to, please call the Suicide and Crisis Lifeline: 988   The patient verbalized understanding of instructions, educational materials, and care plan provided today and DECLINED offer to receive copy of patient instructions, educational materials, and care plan.   No further follow up required: Patient declined further calls   [name], RN, MSN [facility] Care Management Care Management Coordinator

## 2021-10-06 NOTE — Patient Outreach (Signed)
  Care Coordination   Initial Visit Note   10/06/2021 Name: Tammy Arellano MRN: [id number] DOB: 1965-05-08  Tammy Arellano is a 56 y.o. year old female who sees Tammy Arellano, [place] for primary care. I spoke with  Tammy Arellano by phone today.  What matters to the patients health and wellness today?  none     Goals Addressed             This Visit's Progress    COMPLETED: Care coordination activities-no follow up required       Care Coordination Interventions: Advised patient to schedule annual wellness visit. Patient to call office          SDOH assessments and interventions completed:  Yes     Care Coordination Interventions Activated:  Yes  Care Coordination Interventions:  Yes, provided   Follow up plan: No further intervention required.   Encounter Outcome:  Pt. Visit Completed   Tammy Leriche, RN, MSN [facility] Care Management Care Management Coordinator Direct Line [phone] Toll Free: [phone]  Fax: [phone]

## 2021-10-16 DIAGNOSIS — Z1152 Encounter for screening for COVID-19: Secondary | ICD-10-CM | POA: Diagnosis not present

## 2021-10-19 DIAGNOSIS — E1140 Type 2 diabetes mellitus with diabetic neuropathy, unspecified: Secondary | ICD-10-CM | POA: Diagnosis not present

## 2021-10-19 DIAGNOSIS — E11319 Type 2 diabetes mellitus with unspecified diabetic retinopathy without macular edema: Secondary | ICD-10-CM | POA: Diagnosis not present

## 2021-10-22 DIAGNOSIS — Z1152 Encounter for screening for COVID-19: Secondary | ICD-10-CM | POA: Diagnosis not present

## 2021-10-30 DIAGNOSIS — Z1152 Encounter for screening for COVID-19: Secondary | ICD-10-CM | POA: Diagnosis not present

## 2021-11-05 DIAGNOSIS — U071 COVID-19: Secondary | ICD-10-CM | POA: Diagnosis not present

## 2021-11-06 DIAGNOSIS — Z23 Encounter for immunization: Secondary | ICD-10-CM | POA: Diagnosis not present

## 2021-11-12 DIAGNOSIS — Z1152 Encounter for screening for COVID-19: Secondary | ICD-10-CM | POA: Diagnosis not present

## 2021-11-13 DIAGNOSIS — E11319 Type 2 diabetes mellitus with unspecified diabetic retinopathy without macular edema: Secondary | ICD-10-CM | POA: Diagnosis not present

## 2021-11-13 DIAGNOSIS — H6121 Impacted cerumen, right ear: Secondary | ICD-10-CM | POA: Diagnosis not present

## 2021-11-13 DIAGNOSIS — E1140 Type 2 diabetes mellitus with diabetic neuropathy, unspecified: Secondary | ICD-10-CM | POA: Diagnosis not present

## 2021-11-19 DIAGNOSIS — Z1152 Encounter for screening for COVID-19: Secondary | ICD-10-CM | POA: Diagnosis not present

## 2021-11-26 DIAGNOSIS — U071 COVID-19: Secondary | ICD-10-CM | POA: Diagnosis not present

## 2021-11-30 DIAGNOSIS — E11319 Type 2 diabetes mellitus with unspecified diabetic retinopathy without macular edema: Secondary | ICD-10-CM | POA: Diagnosis not present

## 2021-12-09 DIAGNOSIS — Z1152 Encounter for screening for COVID-19: Secondary | ICD-10-CM | POA: Diagnosis not present

## 2021-12-11 DIAGNOSIS — Z1152 Encounter for screening for COVID-19: Secondary | ICD-10-CM | POA: Diagnosis not present

## 2021-12-25 DIAGNOSIS — Z1152 Encounter for screening for COVID-19: Secondary | ICD-10-CM | POA: Diagnosis not present

## 2021-12-30 DIAGNOSIS — E11319 Type 2 diabetes mellitus with unspecified diabetic retinopathy without macular edema: Secondary | ICD-10-CM | POA: Diagnosis not present

## 2022-01-04 DIAGNOSIS — Z1152 Encounter for screening for COVID-19: Secondary | ICD-10-CM | POA: Diagnosis not present

## 2022-01-08 DIAGNOSIS — Z1152 Encounter for screening for COVID-19: Secondary | ICD-10-CM | POA: Diagnosis not present

## 2022-01-23 DIAGNOSIS — Z1152 Encounter for screening for COVID-19: Secondary | ICD-10-CM | POA: Diagnosis not present

## 2022-01-29 DIAGNOSIS — Z794 Long term (current) use of insulin: Secondary | ICD-10-CM | POA: Diagnosis not present

## 2022-01-29 DIAGNOSIS — E11319 Type 2 diabetes mellitus with unspecified diabetic retinopathy without macular edema: Secondary | ICD-10-CM | POA: Diagnosis not present

## 2022-04-01 DIAGNOSIS — E785 Hyperlipidemia, unspecified: Secondary | ICD-10-CM | POA: Diagnosis not present

## 2022-04-01 DIAGNOSIS — D649 Anemia, unspecified: Secondary | ICD-10-CM | POA: Diagnosis not present

## 2022-04-01 DIAGNOSIS — E11319 Type 2 diabetes mellitus with unspecified diabetic retinopathy without macular edema: Secondary | ICD-10-CM | POA: Diagnosis not present

## 2022-04-01 DIAGNOSIS — E1140 Type 2 diabetes mellitus with diabetic neuropathy, unspecified: Secondary | ICD-10-CM | POA: Diagnosis not present

## 2022-04-08 DIAGNOSIS — E11319 Type 2 diabetes mellitus with unspecified diabetic retinopathy without macular edema: Secondary | ICD-10-CM | POA: Diagnosis not present

## 2022-04-08 DIAGNOSIS — J45909 Unspecified asthma, uncomplicated: Secondary | ICD-10-CM | POA: Diagnosis not present

## 2022-04-08 DIAGNOSIS — I10 Essential (primary) hypertension: Secondary | ICD-10-CM | POA: Diagnosis not present

## 2022-06-09 ENCOUNTER — Other Ambulatory Visit: Payer: Self-pay | Admitting: Family Medicine

## 2022-06-09 DIAGNOSIS — Z1231 Encounter for screening mammogram for malignant neoplasm of breast: Secondary | ICD-10-CM

## 2022-06-09 DIAGNOSIS — E11319 Type 2 diabetes mellitus with unspecified diabetic retinopathy without macular edema: Secondary | ICD-10-CM | POA: Diagnosis not present

## 2022-08-09 ENCOUNTER — Ambulatory Visit: Admission: RE | Admit: 2022-08-09 | Payer: 59 | Source: Ambulatory Visit

## 2022-08-09 DIAGNOSIS — Z1231 Encounter for screening mammogram for malignant neoplasm of breast: Secondary | ICD-10-CM

## 2022-08-12 DIAGNOSIS — E785 Hyperlipidemia, unspecified: Secondary | ICD-10-CM | POA: Diagnosis not present

## 2022-08-12 DIAGNOSIS — Z0000 Encounter for general adult medical examination without abnormal findings: Secondary | ICD-10-CM | POA: Diagnosis not present

## 2022-08-12 DIAGNOSIS — E11319 Type 2 diabetes mellitus with unspecified diabetic retinopathy without macular edema: Secondary | ICD-10-CM | POA: Diagnosis not present

## 2022-08-12 DIAGNOSIS — I10 Essential (primary) hypertension: Secondary | ICD-10-CM | POA: Diagnosis not present

## 2022-09-08 DIAGNOSIS — E11319 Type 2 diabetes mellitus with unspecified diabetic retinopathy without macular edema: Secondary | ICD-10-CM | POA: Diagnosis not present

## 2022-12-02 DIAGNOSIS — Z79899 Other long term (current) drug therapy: Secondary | ICD-10-CM | POA: Diagnosis not present

## 2022-12-02 DIAGNOSIS — I10 Essential (primary) hypertension: Secondary | ICD-10-CM | POA: Diagnosis not present

## 2022-12-02 DIAGNOSIS — E785 Hyperlipidemia, unspecified: Secondary | ICD-10-CM | POA: Diagnosis not present

## 2022-12-02 DIAGNOSIS — E11319 Type 2 diabetes mellitus with unspecified diabetic retinopathy without macular edema: Secondary | ICD-10-CM | POA: Diagnosis not present

## 2022-12-02 DIAGNOSIS — Z23 Encounter for immunization: Secondary | ICD-10-CM | POA: Diagnosis not present

## 2023-03-03 DIAGNOSIS — E11319 Type 2 diabetes mellitus with unspecified diabetic retinopathy without macular edema: Secondary | ICD-10-CM | POA: Diagnosis not present

## 2023-03-03 DIAGNOSIS — D649 Anemia, unspecified: Secondary | ICD-10-CM | POA: Diagnosis not present

## 2023-03-03 DIAGNOSIS — I10 Essential (primary) hypertension: Secondary | ICD-10-CM | POA: Diagnosis not present

## 2023-03-10 DIAGNOSIS — E785 Hyperlipidemia, unspecified: Secondary | ICD-10-CM | POA: Diagnosis not present

## 2023-03-10 DIAGNOSIS — E11319 Type 2 diabetes mellitus with unspecified diabetic retinopathy without macular edema: Secondary | ICD-10-CM | POA: Diagnosis not present

## 2023-03-10 DIAGNOSIS — I10 Essential (primary) hypertension: Secondary | ICD-10-CM | POA: Diagnosis not present

## 2023-06-10 DIAGNOSIS — I10 Essential (primary) hypertension: Secondary | ICD-10-CM | POA: Diagnosis not present

## 2023-06-10 DIAGNOSIS — E1140 Type 2 diabetes mellitus with diabetic neuropathy, unspecified: Secondary | ICD-10-CM | POA: Diagnosis not present

## 2023-06-10 DIAGNOSIS — E11319 Type 2 diabetes mellitus with unspecified diabetic retinopathy without macular edema: Secondary | ICD-10-CM | POA: Diagnosis not present

## 2023-07-05 DIAGNOSIS — H04123 Dry eye syndrome of bilateral lacrimal glands: Secondary | ICD-10-CM | POA: Diagnosis not present

## 2023-07-05 DIAGNOSIS — E119 Type 2 diabetes mellitus without complications: Secondary | ICD-10-CM | POA: Diagnosis not present

## 2023-07-05 DIAGNOSIS — H25813 Combined forms of age-related cataract, bilateral: Secondary | ICD-10-CM | POA: Diagnosis not present

## 2023-07-28 DIAGNOSIS — M545 Low back pain, unspecified: Secondary | ICD-10-CM | POA: Diagnosis not present

## 2023-07-28 DIAGNOSIS — M25552 Pain in left hip: Secondary | ICD-10-CM | POA: Diagnosis not present

## 2023-07-28 DIAGNOSIS — M5410 Radiculopathy, site unspecified: Secondary | ICD-10-CM | POA: Diagnosis not present

## 2023-09-19 DIAGNOSIS — D649 Anemia, unspecified: Secondary | ICD-10-CM | POA: Diagnosis not present

## 2023-09-19 DIAGNOSIS — Z0000 Encounter for general adult medical examination without abnormal findings: Secondary | ICD-10-CM | POA: Diagnosis not present

## 2023-09-19 DIAGNOSIS — H6120 Impacted cerumen, unspecified ear: Secondary | ICD-10-CM | POA: Diagnosis not present

## 2023-09-19 DIAGNOSIS — E1140 Type 2 diabetes mellitus with diabetic neuropathy, unspecified: Secondary | ICD-10-CM | POA: Diagnosis not present

## 2023-09-19 DIAGNOSIS — I10 Essential (primary) hypertension: Secondary | ICD-10-CM | POA: Diagnosis not present

## 2023-09-19 DIAGNOSIS — E11319 Type 2 diabetes mellitus with unspecified diabetic retinopathy without macular edema: Secondary | ICD-10-CM | POA: Diagnosis not present

## 2023-09-22 ENCOUNTER — Other Ambulatory Visit: Payer: Self-pay | Admitting: Family Medicine

## 2023-09-22 DIAGNOSIS — Z1231 Encounter for screening mammogram for malignant neoplasm of breast: Secondary | ICD-10-CM

## 2023-10-05 DIAGNOSIS — E119 Type 2 diabetes mellitus without complications: Secondary | ICD-10-CM | POA: Diagnosis not present

## 2023-10-05 DIAGNOSIS — H04123 Dry eye syndrome of bilateral lacrimal glands: Secondary | ICD-10-CM | POA: Diagnosis not present

## 2023-10-12 ENCOUNTER — Ambulatory Visit

## 2024-02-23 ENCOUNTER — Encounter: Payer: Self-pay | Admitting: *Deleted

## 2024-02-23 NOTE — Progress Notes (Signed)
 Tammy Arellano                                          MRN: [id number]   02/23/2024   The [facility] Quality Team Specialist reviewed this patient medical record for the purposes of chart review for care gap closure. The following were reviewed: chart review for care gap closure-glycemic status assessment.    [facility] Quality Team
